# Patient Record
Sex: Female | Born: 1955
Health system: Southern US, Community
[De-identification: ages and names within clinical notes are randomized; demographics above are authoritative.]

## PROBLEM LIST (undated history)

## (undated) DIAGNOSIS — M1990 Unspecified osteoarthritis, unspecified site: Secondary | ICD-10-CM

## (undated) DIAGNOSIS — I10 Essential (primary) hypertension: Secondary | ICD-10-CM

## (undated) DIAGNOSIS — K219 Gastro-esophageal reflux disease without esophagitis: Secondary | ICD-10-CM

## (undated) DIAGNOSIS — D649 Anemia, unspecified: Secondary | ICD-10-CM

## (undated) DIAGNOSIS — K5792 Diverticulitis of intestine, part unspecified, without perforation or abscess without bleeding: Secondary | ICD-10-CM

## (undated) DIAGNOSIS — K259 Gastric ulcer, unspecified as acute or chronic, without hemorrhage or perforation: Secondary | ICD-10-CM

## (undated) DIAGNOSIS — T7840XA Allergy, unspecified, initial encounter: Secondary | ICD-10-CM

## (undated) DIAGNOSIS — K759 Inflammatory liver disease, unspecified: Secondary | ICD-10-CM

## (undated) HISTORY — DX: Allergy, unspecified, initial encounter: T78.40XA

## (undated) HISTORY — PX: FOOT SURGERY: SHX648

## (undated) HISTORY — DX: Gastric ulcer, unspecified as acute or chronic, without hemorrhage or perforation: K25.9

## (undated) HISTORY — DX: Anemia, unspecified: D64.9

## (undated) HISTORY — PX: PARTIAL HYSTERECTOMY: SHX80

## (undated) HISTORY — DX: Gastro-esophageal reflux disease without esophagitis: K21.9

## (undated) HISTORY — PX: EYE SURGERY: SHX253

## (undated) HISTORY — DX: Diverticulitis of intestine, part unspecified, without perforation or abscess without bleeding: K57.92

## (undated) HISTORY — PX: OTHER SURGICAL HISTORY: SHX169

## (undated) HISTORY — PX: ABDOMINAL HYSTERECTOMY: SHX81

## (undated) HISTORY — PX: LAPAROSCOPIC GASTROTOMY W/ REPAIR OF ULCER: SUR772

## (undated) HISTORY — PX: TUBAL LIGATION: SHX77

## (undated) HISTORY — PX: TONSILLECTOMY: SUR1361

---

## 1997-07-31 ENCOUNTER — Other Ambulatory Visit: Admission: RE | Admit: 1997-07-31 | Discharge: 1997-07-31 | Payer: Self-pay | Admitting: Family Medicine

## 1998-02-03 ENCOUNTER — Ambulatory Visit (HOSPITAL_COMMUNITY): Admission: RE | Admit: 1998-02-03 | Discharge: 1998-02-03 | Payer: Self-pay | Admitting: Gastroenterology

## 1998-08-12 ENCOUNTER — Emergency Department (HOSPITAL_COMMUNITY): Admission: EM | Admit: 1998-08-12 | Discharge: 1998-08-12 | Payer: Self-pay | Admitting: Internal Medicine

## 1999-04-12 ENCOUNTER — Encounter: Payer: Self-pay | Admitting: Emergency Medicine

## 1999-04-12 ENCOUNTER — Emergency Department (HOSPITAL_COMMUNITY): Admission: EM | Admit: 1999-04-12 | Discharge: 1999-04-12 | Payer: Self-pay | Admitting: Emergency Medicine

## 2000-11-21 ENCOUNTER — Emergency Department (HOSPITAL_COMMUNITY): Admission: EM | Admit: 2000-11-21 | Discharge: 2000-11-21 | Payer: Self-pay

## 2001-03-01 ENCOUNTER — Emergency Department (HOSPITAL_COMMUNITY): Admission: EM | Admit: 2001-03-01 | Discharge: 2001-03-01 | Payer: Self-pay

## 2001-09-03 ENCOUNTER — Emergency Department (HOSPITAL_COMMUNITY): Admission: EM | Admit: 2001-09-03 | Discharge: 2001-09-03 | Payer: Self-pay | Admitting: *Deleted

## 2001-10-04 ENCOUNTER — Emergency Department (HOSPITAL_COMMUNITY): Admission: EM | Admit: 2001-10-04 | Discharge: 2001-10-04 | Payer: Self-pay

## 2003-04-22 ENCOUNTER — Emergency Department (HOSPITAL_COMMUNITY): Admission: EM | Admit: 2003-04-22 | Discharge: 2003-04-22 | Payer: Self-pay | Admitting: *Deleted

## 2003-07-22 ENCOUNTER — Emergency Department (HOSPITAL_COMMUNITY): Admission: EM | Admit: 2003-07-22 | Discharge: 2003-07-22 | Payer: Self-pay | Admitting: Emergency Medicine

## 2004-07-26 ENCOUNTER — Emergency Department (HOSPITAL_COMMUNITY): Admission: EM | Admit: 2004-07-26 | Discharge: 2004-07-26 | Payer: Self-pay | Admitting: Emergency Medicine

## 2004-07-28 ENCOUNTER — Emergency Department (HOSPITAL_COMMUNITY): Admission: EM | Admit: 2004-07-28 | Discharge: 2004-07-28 | Payer: Self-pay | Admitting: Family Medicine

## 2004-08-02 ENCOUNTER — Emergency Department (HOSPITAL_COMMUNITY): Admission: EM | Admit: 2004-08-02 | Discharge: 2004-08-02 | Payer: Self-pay | Admitting: Family Medicine

## 2005-06-09 ENCOUNTER — Emergency Department (HOSPITAL_COMMUNITY): Admission: EM | Admit: 2005-06-09 | Discharge: 2005-06-10 | Payer: Self-pay | Admitting: Emergency Medicine

## 2006-07-11 ENCOUNTER — Ambulatory Visit: Payer: Self-pay | Admitting: Gastroenterology

## 2006-07-11 LAB — CONVERTED CEMR LAB
ALT: 29 units/L (ref 0–35)
AST: 39 units/L — ABNORMAL HIGH (ref 0–37)
Albumin: 3.2 g/dL — ABNORMAL LOW (ref 3.5–5.2)
Alkaline Phosphatase: 54 units/L (ref 39–117)
Ammonia: 22 umol/L (ref 11–35)
BUN: 6 mg/dL (ref 6–23)
Basophils Absolute: 0.1 10*3/uL (ref 0.0–0.1)
Basophils Relative: 0.7 % (ref 0.0–1.0)
Bilirubin, Direct: 0.1 mg/dL (ref 0.0–0.3)
CO2: 32 meq/L (ref 19–32)
Calcium: 8.9 mg/dL (ref 8.4–10.5)
Chloride: 110 meq/L (ref 96–112)
Creatinine, Ser: 1.1 mg/dL (ref 0.4–1.2)
Eosinophils Absolute: 0.1 10*3/uL (ref 0.0–0.6)
Eosinophils Relative: 1.2 % (ref 0.0–5.0)
GFR calc Af Amer: 68 mL/min
GFR calc non Af Amer: 56 mL/min
Glucose, Bld: 90 mg/dL (ref 70–99)
HCT: 33.4 % — ABNORMAL LOW (ref 36.0–46.0)
Hemoglobin: 11.2 g/dL — ABNORMAL LOW (ref 12.0–15.0)
Lymphocytes Relative: 42.6 % (ref 12.0–46.0)
MCHC: 33.5 g/dL (ref 30.0–36.0)
MCV: 87.5 fL (ref 78.0–100.0)
Monocytes Absolute: 0.8 10*3/uL — ABNORMAL HIGH (ref 0.2–0.7)
Monocytes Relative: 8.8 % (ref 3.0–11.0)
Neutro Abs: 4.5 10*3/uL (ref 1.4–7.7)
Neutrophils Relative %: 46.7 % (ref 43.0–77.0)
Platelets: 264 10*3/uL (ref 150–400)
Potassium: 3.4 meq/L — ABNORMAL LOW (ref 3.5–5.1)
RBC: 3.81 M/uL — ABNORMAL LOW (ref 3.87–5.11)
RDW: 13.9 % (ref 11.5–14.6)
Sodium: 144 meq/L (ref 135–145)
Total Bilirubin: 0.7 mg/dL (ref 0.3–1.2)
Total Protein: 6.2 g/dL (ref 6.0–8.3)
WBC: 9.6 10*3/uL (ref 4.5–10.5)

## 2006-07-13 ENCOUNTER — Ambulatory Visit: Payer: Self-pay | Admitting: Gastroenterology

## 2006-08-16 ENCOUNTER — Ambulatory Visit: Payer: Self-pay | Admitting: Gastroenterology

## 2006-11-22 ENCOUNTER — Ambulatory Visit (HOSPITAL_COMMUNITY): Admission: RE | Admit: 2006-11-22 | Discharge: 2006-11-22 | Payer: Self-pay | Admitting: Obstetrics and Gynecology

## 2007-04-09 DIAGNOSIS — K2980 Duodenitis without bleeding: Secondary | ICD-10-CM | POA: Insufficient documentation

## 2007-04-09 DIAGNOSIS — Z8711 Personal history of peptic ulcer disease: Secondary | ICD-10-CM | POA: Insufficient documentation

## 2007-04-09 DIAGNOSIS — K5900 Constipation, unspecified: Secondary | ICD-10-CM | POA: Insufficient documentation

## 2008-06-10 ENCOUNTER — Encounter (HOSPITAL_COMMUNITY): Payer: Self-pay | Admitting: Obstetrics and Gynecology

## 2008-06-10 ENCOUNTER — Inpatient Hospital Stay (HOSPITAL_COMMUNITY): Admission: RE | Admit: 2008-06-10 | Discharge: 2008-06-12 | Payer: Self-pay | Admitting: Obstetrics and Gynecology

## 2010-01-24 ENCOUNTER — Encounter: Payer: Self-pay | Admitting: Gastroenterology

## 2010-04-12 LAB — COMPREHENSIVE METABOLIC PANEL
AST: 34 U/L (ref 0–37)
Albumin: 3.7 g/dL (ref 3.5–5.2)
Alkaline Phosphatase: 70 U/L (ref 39–117)
BUN: 7 mg/dL (ref 6–23)
CO2: 25 mEq/L (ref 19–32)
Chloride: 108 mEq/L (ref 96–112)
Potassium: 4.4 mEq/L (ref 3.5–5.1)
Total Bilirubin: 0.4 mg/dL (ref 0.3–1.2)

## 2010-04-12 LAB — CBC
HCT: 40.2 % (ref 36.0–46.0)
Hemoglobin: 12.3 g/dL (ref 12.0–15.0)
MCHC: 33.2 g/dL (ref 30.0–36.0)
MCV: 93 fL (ref 78.0–100.0)
Platelets: 199 10*3/uL (ref 150–400)
RBC: 3.98 MIL/uL (ref 3.87–5.11)
RBC: 4.33 MIL/uL (ref 3.87–5.11)
WBC: 8.1 10*3/uL (ref 4.0–10.5)

## 2010-04-12 LAB — TYPE AND SCREEN: ABO/RH(D): O POS

## 2010-05-18 NOTE — Assessment & Plan Note (Signed)
[facility]                         GASTROENTEROLOGY OFFICE NOTE   NAME:Angela Villanueva, Angela Villanueva                  MRN:          [id number]  DATE:07/11/2006                            DOB:          [birth year]    PROBLEMS:  Abdominal pain.   HISTORY OF PRESENT ILLNESS:  [name] is a 55 year old African  American female complaining of severe abdominal gas.  She is unable to  bring up or pass gas.  She has a burning sensation throughout her  abdomen that radiates into her chest.  She has burning with swallowing.  She complains of dysphagia to solids and liquids.  She complains of  chronic constipation and is unable to go without the use of stimulants.  She is taking 25 Dulcolax tablets a day as well as thirty 200 mg  cimetidine tablets daily.  She denies vomiting.  She claims to have been  having black, yellow and green stools.  There is no history of frank  rectal bleeding.  She has lost 15 pounds in the past 6 days.  She has a  bad taste in her mouth.  She has a remote history of ulcers 20 years  ago.   PAST MEDICAL HISTORY:  Pertinent for tubal ligation and ulcerative  surgery.   FAMILY HISTORY:  Positive for sister with ovarian cancer.   MEDICATIONS:  1. Gas-X.  2. Dulcolax.  3. Cimetidine.   ALLERGIES:  PENICILLIN.   SOCIAL HISTORY:  She neither smokes or drinks.  She is single and works  as a Merchandiser, retail at [employer].   REVIEW OF SYSTEMS:  Positive for sleeping problems, dysmenorrhea,  fatigue and headaches.   PHYSICAL EXAMINATION:  VITAL SIGNS:  Pulse 72, blood pressure 94/58,  weight 113.4.  HEENT:  EOMI. PERRLA. Sclerae are anicteric.  Conjunctivae are pink.  NECK:  Supple without thyromegaly, adenopathy or carotid bruits.  CHEST:  Clear to auscultation and percussion without adventitious  sounds.  CARDIAC:  Regular rhythm; normal S1 S2.  There are no murmurs, gallops  or rubs.  ABDOMEN:  She has mild diffuse abdominal tenderness  without guarding or  rebound.  There are no abdominal masses or organomegaly.  RECTAL:  There is no stool in the rectal vaults.  The stool is hemoccult  negative.  EXTREMITIES:  Full range of motion.  No cyanosis, clubbing or edema.   IMPRESSION:  1. Diffuse abdominal pain with what sounds like acid reflux.  2. Constipation.  3. Drug abuse of cimetidine and Dulcolax.   RECOMMENDATION:  1. Discontinue Dulcolax and cimetidine.  2. Begin Zegerid 40 mg b.i.d.  3. Upper endoscopy and colonoscopy.  4. Check CBC, amylase and C-met.     Barbette Hair. Arlyce Dice, MD,FACG  Electronically Signed    RDK/MedQ  DD: 07/11/2006  DT: 07/12/2006  Job #: [id number]   cc:   Lorelle Formosa, M.D.

## 2010-05-18 NOTE — Assessment & Plan Note (Signed)
[facility]                         GASTROENTEROLOGY OFFICE NOTE   NAME:Villanueva, Angela LAVERGNE                  MRN:          [id number]  DATE:08/16/2006                            DOB:          1955-03-19    PROBLEM:  Abdominal pain.   Angela Villanueva has returned for scheduled followup. On a regimen of  Zegerid 40 mg daily, she no longer has GI complaints. Specifically, she  is without pain, nausea, or bloating. She has gained 6 pounds since her  visit one month ago.   PHYSICAL EXAMINATION:  Pulse 72, blood pressure 112/64, weight 119.   IMPRESSION:  Multiple GI complaints very likely related to drug abuse  with cimetidine and Dulcolax.   RECOMMENDATIONS:  Switch to omeprazole 20 mg a day as maintenance. She  will return as needed.     Barbette Hair. Arlyce Dice, MD,FACG  Electronically Signed    RDK/MedQ  DD: 08/16/2006  DT: 08/17/2006  Job #: [id number]   cc:   Lorelle Formosa, M.D.

## 2010-05-21 NOTE — Op Note (Signed)
NAME:  Angela Villanueva, Angela Villanueva NO.:  [id number]   MEDICAL RECORD NO.:  [id number]          PATIENT TYPE:  INP   LOCATION:  9316                          FACILITY:  [facility]   PHYSICIAN:  Zelphia Cairo, MD    DATE OF BIRTH:  Jan 20, 1955   DATE OF PROCEDURE:  06/13/2008  DATE OF DISCHARGE:  06/12/2008                               OPERATIVE REPORT   PREOPERATIVE DIAGNOSES:  1. Fibroid uterus.  2. Pelvic pain.   POSTOPERATIVE DIAGNOSES:  1. Fibroid uterus.  2. Pelvic pain.   PROCEDURE:  Total abdominal hysterectomy.   SURGEON:  Zelphia Cairo, MD   ASSISTANT:  Dineen Kid. Rana Snare, MD   ANESTHESIA:  General.   COMPLICATIONS:  None.   BLOOD LOSS:  Minimal.   FINDINGS:  Irregularly shaped enlarged fibroid uterus.  Bilateral  fallopian tubes and ovaries appeared normal.   PROCEDURE:  The risks, benefits, indications, and alternatives of  hysterectomy were reviewed with [name] and informed consent was  obtained.  She was taken to the operating room, where general anesthesia  was obtained.  She was in the supine position.  She was prepped and  draped in sterile fashion and a Foley catheter was inserted sterilely.  Pfannenstiel skin incision was made with a scalpel approximately 2 cm  above the symphysis pubis and extended sharply to the level of the  fascia.  The fascia was incised bilaterally with curved Mayo scissors  and the muscles of the anterior abdominal wall were separated in the  midline by sharp and blunt dissection.  Peritoneum was grasped between 2  pickups, elevated, and sharply entered with Metzenbaum scissors.  This  was extended superiorly and inferiorly with good visualization of the  bladder.  The uterus was then palpated and brought through our incision.  Moist lap sponges were placed into the abdomen to pack the bowel away  from our incision.  Two Kelly clamps were placed on the cornua and used  for retraction.  Bilateral round ligaments were clamped,  transected, and  cauterized using the Bovie.  The anterior leaf of the broad ligament was  incised along the bladder reflection to the midline from both sides.  The bladder was then gently dissected off the lower uterine segment and  the cervix using a 4 x 4 sponge.  The utero-ovarian ligament and  fallopian tube were then clamped, transected, and suture ligated using 0  Vicryl.  Hemostasis was achieved.  Uterine arteries were skeletonized  bilaterally, clamped with Heaney clamps, transected, and suture ligated.  Once hemostasis was assured bilaterally, uterosacral ligaments were  clamped, transected, and suture ligated.  The cervix and uterus were  then amputated.  The vaginal cuff angles were closed using figure-of-  eight stitches and the vaginal cuff was reapproximated using figure-of-  eight with 0-0 Vicryl.  Uterosacral ligaments were reapproximated using  a single interrupted suture.  The pelvis was then irrigated with warm  normal saline.  All  pedicles were inspected and found to be hemostatic.  Lap and sponges  were removed from the abdomen.  The fascia was closed with a running  locked stitch.  The skin was closed with a 3-0 Vicryl.  Sponge, lap,  needle, and instrument counts were correct x2.  She was taken to the  PACU in stable condition.      Zelphia Cairo, MD  Electronically Signed     GA/MEDQ  D:  06/13/2008  T:  06/14/2008  Job:  [id number]

## 2010-10-12 ENCOUNTER — Ambulatory Visit: Payer: Self-pay

## 2010-10-27 ENCOUNTER — Other Ambulatory Visit: Payer: Self-pay | Admitting: Gastroenterology

## 2011-02-14 ENCOUNTER — Emergency Department (HOSPITAL_COMMUNITY)
Admission: EM | Admit: 2011-02-14 | Discharge: 2011-02-14 | Disposition: A | Discharge status: Home / Self Care | Payer: Self-pay | Attending: Emergency Medicine | Admitting: Emergency Medicine

## 2011-02-14 ENCOUNTER — Encounter (HOSPITAL_COMMUNITY): Payer: Self-pay | Admitting: *Deleted

## 2011-02-14 DIAGNOSIS — H5710 Ocular pain, unspecified eye: Secondary | ICD-10-CM | POA: Insufficient documentation

## 2011-02-14 DIAGNOSIS — H9319 Tinnitus, unspecified ear: Secondary | ICD-10-CM | POA: Insufficient documentation

## 2011-02-14 DIAGNOSIS — H1130 Conjunctival hemorrhage, unspecified eye: Secondary | ICD-10-CM | POA: Insufficient documentation

## 2011-02-14 DIAGNOSIS — H1131 Conjunctival hemorrhage, right eye: Secondary | ICD-10-CM

## 2011-02-14 DIAGNOSIS — Z79899 Other long term (current) drug therapy: Secondary | ICD-10-CM | POA: Insufficient documentation

## 2011-02-14 MED ORDER — FLUORESCEIN SODIUM 1 MG OP STRP
1.0000 | ORAL_STRIP | Freq: Once | OPHTHALMIC | Status: AC
  Administered 2011-02-14: 1 via OPHTHALMIC
  Filled 2011-02-14: qty 1

## 2011-02-14 MED ORDER — TETRACAINE HCL 0.5 % OP SOLN
1.0000 [drp] | Freq: Once | OPHTHALMIC | Status: AC
  Administered 2011-02-14: 1 [drp] via OPHTHALMIC
  Filled 2011-02-14: qty 2

## 2011-02-14 MED ORDER — MECLIZINE HCL 25 MG PO TABS: 25.0000 mg | ORAL_TABLET | Freq: Once | ORAL | Status: DC

## 2011-02-14 NOTE — ED Provider Notes (Signed)
History     CSN: [id number]  Arrival date & time 02/14/11  1701   First MD Initiated Contact with Patient 02/14/11 2103      Chief Complaint  Patient presents with  . Tinnitus  . Eye Pain    denies pain; only c/o redness    (Consider location/radiation/quality/duration/timing/severity/associated sxs/prior treatment) HPI Comments: Patient presents emergency department with a chief complaint of bilateral tinnitus x2 days.  Patient denies any ear pain or change in hearing.  Patient has had no difficulty with ambulation and denies disequilibrium, headaches, ataxia, vertigo, nausea, vomiting, abdominal pain.  In addition patient states that she woke up this morning with a right red eye.  Patient states that she was constipated last night and had a very she should drained bowel movement which she thinks may have caused the redness of her eye.  Patient denies any pain, change in vision, photophobia, tearing or discharge from the eye, headache.  Associated symptoms include nasal congestion that started on Friday.  The history is provided by the patient.    History reviewed. No pertinent past medical history.  Past Surgical History  Procedure Date  . Fibroid turmor removal   . Abdominal hysterectomy     No family history on file.  History  Substance Use Topics  . Smoking status: Former Games developer  . Smokeless tobacco: Not on file  . Alcohol Use: No    OB History    Grav Para Term Preterm Abortions TAB SAB Ect Mult Living                  Review of Systems  All other systems reviewed and are negative.  Ten systems reviewed and are negative for acute change, except as noted in the HPI.    Allergies  Penicillins  Home Medications   Current Outpatient Rx  Name Route Sig Dispense Refill  . LANSOPRAZOLE 15 MG PO TBDP Oral Take 15 mg by mouth daily.    Marland Kitchen NAPROXEN SODIUM 220 MG PO TABS Oral Take 220 mg by mouth 2 (two) times daily with a meal.      BP 128/83  Pulse 61   Temp(Src) 98.1 F (36.7 C) (Oral)  Resp 16  Ht 5\' 4"  (1.626 m)  Wt 100 lb (45.36 kg)  BMI 17.17 kg/m2  SpO2 100%  Physical Exam  Nursing note and vitals reviewed. Constitutional: She is oriented to person, place, and time. She appears well-developed and well-nourished. No distress.  HENT:  Head: Normocephalic and atraumatic.       Pt can hear finger rub, no cerumen impaction, external ear canals normal bilaterally, normal TM   Eyes: Conjunctivae and EOM are normal. Pupils are equal, round, and reactive to light. Right eye exhibits no discharge. Left eye exhibits no discharge. No foreign body present in the left eye.       No tenderness to palpation over temporal arteries or orbital region. Pain free EOMs, visual acuity equal bilaterally, no increase in IOPs, no proptosis, lid swelling, hyphema, purulent discharge from eyes, or consensual photophobia. Right eye positive for subconjunctival hemorrhage.   Fluorescein study: no corneal uptake, dendritic pattern, or evidence of corneal abrasions.  Neck: Normal range of motion. Neck supple.  Pulmonary/Chest: Effort normal.  Neurological: She is alert and oriented to person, place, and time.  Skin: Skin is warm and dry. No rash noted. She is not diaphoretic.  Psychiatric: Her behavior is normal.    ED Course  Procedures (including critical care time)  Labs Reviewed - No data to display No results found.   No diagnosis found.    MDM  Tinnitus, subconjunctival hemorrhage           Jaci Carrel, PA-C 02/14/11 2202

## 2011-02-14 NOTE — ED Notes (Signed)
Pt states she will return in AM to pick up discharge papers. Reports she has to be at work at 0300.

## 2011-02-14 NOTE — ED Notes (Signed)
Pt states "I've been having ringing in my ear has been going on for 2 days, woke up this morning with my eye red"; pt denies ROD itching or having drainage

## 2011-02-14 NOTE — Discharge Instructions (Signed)
Subconjunctival Hemorrhage Your exam shows you have a subconjunctival hemorrhage. This is a harmless collection of blood covering a portion of the white of the eye. This condition may be due to injury or to straining (lifting, sneezing, or coughing). Often, there is no known cause. Subconjunctival blood does not cause pain or vision problems. This condition needs no treatment. It will take 1 to 2 weeks for the blood to dissolve. If you take aspirin or Coumadin on a daily basis or if you have high blood pressure, you should check with your doctor about the need for further treatment. Please call your doctor if you have problems with your vision, pain around the eye, or any other concerns about your condition. Document Released: 01/28/2004 Document Revised: 09/01/2010 Document Reviewed: 11/17/2008 [facility] Patient Information 2012 [place], [place].  Tinnitus Sounds you hear in your ears and coming from within the ear is called tinnitus. This can be a symptom of many ear disorders. It is often associated with hearing loss.  Tinnitus can be seen with:  Infections.   Ear blockages such as wax buildup.   Meniere's disease.   Ear damage.   Inherited.   Occupational causes.  While irritating, it is not usually a threat to health. When the cause of the tinnitus is wax, infection in the middle ear, or foreign body it is easily treated. Hearing loss will usually be reversible.  TREATMENT  When treating the underlying cause does not get rid of tinnitus, it may be necessary to get rid of the unwanted sound by covering it up with more pleasant background noises. This may include music, the radio etc. There are tinnitus maskers which can be worn which produce background noise to cover up the tinnitus. Avoid all medications which tend to make tinnitus worse such as alcohol, caffeine, aspirin, and nicotine. There are many soothing background tapes such as rain, ocean, thunderstorms, etc. These soothing sounds  help with sleeping or resting. Keep all follow-up appointments and referrals. This is important to identify the cause of the problem. It also helps avoid complications, impaired hearing, disability, or chronic pain. Document Released: 12/20/2004 Document Revised: 09/01/2010 Document Reviewed: 08/08/2007 [facility] Patient Information 2012 [place], [place].

## 2011-02-15 NOTE — ED Provider Notes (Signed)
Medical screening examination/treatment/procedure(s) were performed by non-physician practitioner and as supervising physician I was immediately available for consultation/collaboration.  Raeford Razor, MD 02/15/11 [phone]

## 2011-08-08 ENCOUNTER — Telehealth: Payer: Self-pay | Admitting: Gastroenterology

## 2011-08-08 NOTE — Telephone Encounter (Signed)
Left message for pt to call back  °

## 2011-08-09 NOTE — Telephone Encounter (Signed)
Pt states she is having problems with reflux and keeping her food down. Also c/o "knots in her stomach." Requesting sooner appt. Pt scheduled 08/19/11@3pm  with Mike Gip PA. Pt aware of appt date and time.

## 2011-08-19 ENCOUNTER — Ambulatory Visit: Payer: Self-pay | Admitting: Physician Assistant

## 2012-03-27 ENCOUNTER — Encounter: Payer: Self-pay | Admitting: Gastroenterology

## 2012-05-02 ENCOUNTER — Ambulatory Visit (INDEPENDENT_AMBULATORY_CARE_PROVIDER_SITE_OTHER): Payer: Self-pay | Admitting: Gastroenterology

## 2012-05-02 ENCOUNTER — Encounter: Payer: Self-pay | Admitting: Gastroenterology

## 2012-05-02 VITALS — BP 120/70 | HR 72 | Ht 64.0 in | Wt 110.2 lb

## 2012-05-02 DIAGNOSIS — K5900 Constipation, unspecified: Secondary | ICD-10-CM

## 2012-05-02 DIAGNOSIS — K219 Gastro-esophageal reflux disease without esophagitis: Secondary | ICD-10-CM

## 2012-05-02 DIAGNOSIS — R1310 Dysphagia, unspecified: Secondary | ICD-10-CM

## 2012-05-02 MED ORDER — NA SULFATE-K SULFATE-MG SULF 17.5-3.13-1.6 GM/177ML PO SOLN: 1.0000 | Freq: Once | ORAL | Status: DC

## 2012-05-02 NOTE — Assessment & Plan Note (Signed)
The patient is symptomatic despite therapy with Zegerid.  Recommendations #1 trial of dexilant 60 mg before breakfast and dinner

## 2012-05-02 NOTE — Progress Notes (Signed)
History of Present Illness: 57 year old Afro-American female self-referred for evaluation of pyrosis and constipation. Both have been chronic problems. She takes Zegerid without much relief. She complains of odynophagia and dysphagia to solids. She has postprandial upper abdominal discomfort as well. Although Naprosyn is listed amongst her medications she is not taking it   Constipation is an active problem. Without the use of a cathartic she feels that she cannot move her bowels. With constipation she has abdominal bloating and fullness. Esophageal manometry in 2000 was normal. There was very slight incomplete relaxation of the LES. Endoscopy in 2008 demonstrated duodenitis.     History reviewed. No pertinent past medical history. Past Surgical History  Procedure Laterality Date  . Fibroid turmor removal    . Abdominal hysterectomy    . Laparoscopic gastrotomy w/ repair of ulcer     family history includes COPD in her mother. Current Outpatient Prescriptions  Medication Sig Dispense Refill  . magnesium hydroxide (MILK OF MAGNESIA) 400 MG/5ML suspension Take 5 mLs by mouth daily as needed for constipation.      . naproxen sodium (ANAPROX) 220 MG tablet Take 220 mg by mouth 2 (two) times daily with a meal.      . omeprazole-sodium bicarbonate (ZEGERID) 40-1100 MG per capsule Take 1 capsule by mouth daily before breakfast.      . simethicone (MYLICON) 80 MG chewable tablet Chew 80 mg by mouth every 6 (six) hours as needed for flatulence.       No current facility-administered medications for this visit.   Allergies as of 05/02/2012 - Review Complete 05/02/2012  Allergen Reaction Noted  . Penicillins Hives and Rash 02/14/2011    reports that she has quit smoking. She has never used smokeless tobacco. She reports that she does not drink alcohol or use illicit drugs.     Review of Systems: She complains of hot flashes and cold flashes Pertinent positive and negative review of systems were  noted in the above HPI section. All other review of systems were otherwise negative.  Vital signs were reviewed in today's medical record Physical Exam: General: Well developed , well nourished, no acute distress Skin: anicteric Head: Normocephalic and atraumatic Eyes:  sclerae anicteric, EOMI Ears: Normal auditory acuity Mouth: No deformity or lesions Neck: Supple, no masses or thyromegaly Lungs: Clear throughout to auscultation Heart: Regular rate and rhythm; no murmurs, rubs or bruits Abdomen: Soft,  and non distended. No masses, hepatosplenomegaly or hernias noted. Normal Bowel sounds. Slight mid epigastric tenderness Rectal:deferred Musculoskeletal: Symmetrical with no gross deformities  Skin: No lesions on visible extremities Pulses:  Normal pulses noted Extremities: No clubbing, cyanosis, edema or deformities noted Neurological: Alert oriented x 4, grossly nonfocal Cervical Nodes:  No significant cervical adenopathy Inguinal Nodes: No significant inguinal adenopathy Psychological:  Alert and cooperative. Normal mood and affect

## 2012-05-02 NOTE — Patient Instructions (Addendum)
Gastroesophageal Reflux Disease, Adult Gastroesophageal reflux disease (GERD) happens when acid from your stomach flows up into the esophagus. When acid comes in contact with the esophagus, the acid causes soreness (inflammation) in the esophagus. Over time, GERD may create small holes (ulcers) in the lining of the esophagus. CAUSES   Increased body weight. This puts pressure on the stomach, making acid rise from the stomach into the esophagus.  Smoking. This increases acid production in the stomach.  Drinking alcohol. This causes decreased pressure in the lower esophageal sphincter (valve or ring of muscle between the esophagus and stomach), allowing acid from the stomach into the esophagus.  Late evening meals and a full stomach. This increases pressure and acid production in the stomach.  A malformed lower esophageal sphincter. Sometimes, no cause is found. SYMPTOMS   Burning pain in the lower part of the mid-chest behind the breastbone and in the mid-stomach area. This may occur twice a week or more often.  Trouble swallowing.  Sore throat.  Dry cough.  Asthma-like symptoms including chest tightness, shortness of breath, or wheezing. DIAGNOSIS  Your caregiver may be able to diagnose GERD based on your symptoms. In some cases, X-rays and other tests may be done to check for complications or to check the condition of your stomach and esophagus. TREATMENT  Your caregiver may recommend over-the-counter or prescription medicines to help decrease acid production. Ask your caregiver before starting or adding any new medicines.  HOME CARE INSTRUCTIONS   Change the factors that you can control. Ask your caregiver for guidance concerning weight loss, quitting smoking, and alcohol consumption.  Avoid foods and drinks that make your symptoms worse, such as:  Caffeine or alcoholic drinks.  Chocolate.  Peppermint or mint flavorings.  Garlic and onions.  Spicy foods.  Citrus fruits,  such as oranges, lemons, or limes.  Tomato-based foods such as sauce, chili, salsa, and pizza.  Fried and fatty foods.  Avoid lying down for the 3 hours prior to your bedtime or prior to taking a nap.  Eat small, frequent meals instead of large meals.  Wear loose-fitting clothing. Do not wear anything tight around your waist that causes pressure on your stomach.  Raise the head of your bed 6 to 8 inches with wood blocks to help you sleep. Extra pillows will not help.  Only take over-the-counter or prescription medicines for pain, discomfort, or fever as directed by your caregiver.  Do not take aspirin, ibuprofen, or other nonsteroidal anti-inflammatory drugs (NSAIDs). SEEK IMMEDIATE MEDICAL CARE IF:   You have pain in your arms, neck, jaw, teeth, or back.  Your pain increases or changes in intensity or duration.  You develop nausea, vomiting, or sweating (diaphoresis).  You develop shortness of breath, or you faint.  Your vomit is green, yellow, black, or looks like coffee grounds or blood.  Your stool is red, bloody, or black. These symptoms could be signs of other problems, such as heart disease, gastric bleeding, or esophageal bleeding. MAKE SURE YOU:   Understand these instructions.  Will watch your condition.  Will get help right away if you are not doing well or get worse. Document Released: 09/29/2004 Document Revised: 03/14/2011 Document Reviewed: 07/09/2010 [facility] Patient Information 2013 [place], [place].   You have been scheduled for an endoscopy with propofol. Please follow written instructions given to you at your visit today. If you use inhalers (even only as needed), please bring them with you on the day of your procedure. Your physician has requested  that you go to www.startemmi.com and enter the access code given to you at your visit today. This web site gives a general overview about your procedure. However, you should still follow specific instructions  given to you by our office regarding your preparation for the procedure.   You have been scheduled for a colonoscopy with propofol. Please follow written instructions given to you at your visit today.  Please pick up your prep kit at the pharmacy within the next 1-3 days. If you use inhalers (even only as needed), please bring them with you on the day of your procedure. Your physician has requested that you go to www.startemmi.com and enter the access code given to you at your visit today. This web site gives a general overview about your procedure. However, you should still follow specific instructions given to you by our office regarding your preparation for the procedure.

## 2012-05-02 NOTE — Assessment & Plan Note (Signed)
Patient probably has idiopathic constipation. A structural abnormality of the colon should be ruled out.  Recommendations #1 colonoscopy #2 if no abnormalities are seen that may be responsible for constipation she will consider enrollment in a constipation trial

## 2012-05-02 NOTE — Assessment & Plan Note (Signed)
The patient has both odynophagia and dysphagia suggesting both esophageal inflammation and possible stricture  Recommendations #1 upper endoscopy with dilatation as indicated  Risks, alternatives, and complications of the procedure, including bleeding, perforation, and possible need for surgery, were explained to the patient.  Patient's questions were answered.

## 2012-05-10 ENCOUNTER — Ambulatory Visit (AMBULATORY_SURGERY_CENTER): Payer: Self-pay | Admitting: Gastroenterology

## 2012-05-10 ENCOUNTER — Encounter: Payer: Self-pay | Admitting: Gastroenterology

## 2012-05-10 VITALS — BP 135/94 | HR 63 | Temp 97.1°F | Resp 14 | Ht 64.0 in | Wt 110.0 lb

## 2012-05-10 DIAGNOSIS — K2970 Gastritis, unspecified, without bleeding: Secondary | ICD-10-CM

## 2012-05-10 DIAGNOSIS — R1310 Dysphagia, unspecified: Secondary | ICD-10-CM

## 2012-05-10 DIAGNOSIS — K219 Gastro-esophageal reflux disease without esophagitis: Secondary | ICD-10-CM

## 2012-05-10 DIAGNOSIS — K2990 Gastroduodenitis, unspecified, without bleeding: Secondary | ICD-10-CM

## 2012-05-10 MED ORDER — SODIUM CHLORIDE 0.9 % IV SOLN: 500.0000 mL | INTRAVENOUS | Status: DC

## 2012-05-10 NOTE — Patient Instructions (Addendum)
YOU HAD AN ENDOSCOPIC PROCEDURE TODAY AT THE [facility]: Refer to the procedure report that was given to you for any specific questions about what was found during the examination.  If the procedure report does not answer your questions, please call your gastroenterologist to clarify.  If you requested that your care partner not be given the details of your procedure findings, then the procedure report has been included in a sealed envelope for you to review at your convenience later.  YOU SHOULD EXPECT: Some feelings of bloating in the abdomen. Passage of more gas than usual.  Walking can help get rid of the air that was put into your GI tract during the procedure and reduce the bloating. If you had a lower endoscopy (such as a colonoscopy or flexible sigmoidoscopy) you may notice spotting of blood in your stool or on the toilet paper. If you underwent a bowel prep for your procedure, then you may not have a normal bowel movement for a few days.  DIET:YOU MAY HAVE CLEAR LIQUIDS AT 4:45PM, THEN YOU MAY PROCEED TO A SOFT DIET ALL NIGHT TONIGHT.REDUCE DEXILANT TO1 TIME PER DAY.  DR Arlyce Dice WANTS TO SEE YOU IN THE OFFICE IN ONE MONTH. Drink plenty of fluids but you should avoid alcoholic beverages for 24 hours.  ACTIVITY: Your care partner should take you home directly after the procedure.  You should plan to take it easy, moving slowly for the rest of the day.  You can resume normal activity the day after the procedure however you should NOT DRIVE or use heavy machinery for 24 hours (because of the sedation medicines used during the test).    SYMPTOMS TO REPORT IMMEDIATELY: A gastroenterologist can be reached at any hour.  During normal business hours, 8:30 AM to 5:00 PM Monday through Friday, call [phone].  After hours and on weekends, please call the GI answering service at [phone] who will take a message and have the physician on call contact you.   Following upper endoscopy  (EGD)  Vomiting of blood or coffee ground material  New chest pain or pain under the shoulder blades  Painful or persistently difficult swallowing  New shortness of breath  Fever of 100F or higher  Black, tarry-looking stools  FOLLOW UP: If any biopsies were taken you will be contacted by phone or by letter within the next 1-3 weeks.  Call your gastroenterologist if you have not heard about the biopsies in 3 weeks.  Our staff will call the home number listed on your records the next business day following your procedure to check on you and address any questions or concerns that you may have at that time regarding the information given to you following your procedure. This is a courtesy call and so if there is no answer at the home number and we have not heard from you through the emergency physician on call, we will assume that you have returned to your regular daily activities without incident.  SIGNATURES/CONFIDENTIALITY: You and/or your care partner have signed paperwork which will be entered into your electronic medical record.  These signatures attest to the fact that that the information above on your After Visit Summary has been reviewed and is understood.  Full responsibility of the confidentiality of this discharge information lies with you and/or your care-partner.

## 2012-05-10 NOTE — Progress Notes (Signed)
Patient did not have preoperative order for IV antibiotic SSI prophylaxis. (G8918)  Patient did not experience any of the following events: a burn prior to discharge; a fall within the facility; wrong site/side/patient/procedure/implant event; or a hospital transfer or hospital admission upon discharge from the facility. (G8907)  

## 2012-05-10 NOTE — Op Note (Signed)
[facility] [address] [place], [address]   ENDOSCOPY PROCEDURE REPORT  PATIENT: Angela, Villanueva  MR#: [id number] BIRTHDATE: 1955/12/19 , 57  yrs. old GENDER: Female ENDOSCOPIST: Louis Meckel, MD REFERRED BY: PROCEDURE DATE:  05/10/2012 PROCEDURE:  EGD w/ biopsy and Maloney dilation of esophagus ASA CLASS:     Class II INDICATIONS:  Dysphagia. MEDICATIONS: MAC sedation, administered by CRNA, propofol (Diprivan) 150mg  IV, and Simethicone 0.6cc PO TOPICAL ANESTHETIC: Cetacaine Spray  DESCRIPTION OF PROCEDURE: After the risks benefits and alternatives of the procedure were thoroughly explained, informed consent was obtained.  The LB [id number] endoscope was introduced through the mouth and advanced to the third portion of the duodenum. Without limitations.  The instrument was slowly withdrawn as the mucosa was fully examined.      In the gastric fundus and body there were patches of atrophic appearing mucosa along the greater curvature.  Biopsies were taken.  There is mild erythema at the GE junction.  No frank stricture was seen.   The remainder of the upper endoscopy exam was otherwise normal.  Retroflexed views revealed no abnormalities.     The scope was then withdrawn from the patient .  Because of complaints of dysphagia a #52 [place] Maloney dilator was passed. There was minimal resistance and no heme.  COMPLICATIONS: There were no complications.  ENDOSCOPIC IMPRESSION: esophagitis Gastritis Dysphagia without stricture-status post Elease Hashimoto dilator  RECOMMENDATIONS: Reduce dexilant to once daily Await biopsy results Office visit one month REPEAT EXAM:  eSigned:  Louis Meckel, MD 05/10/2012 3:17 PM   CC:

## 2012-05-11 ENCOUNTER — Telehealth: Payer: Self-pay | Admitting: *Deleted

## 2012-05-11 NOTE — Telephone Encounter (Signed)
  Follow up Call-  Call back number 05/10/2012  Post procedure Call Back phone  # [phone], [phone]  Permission to leave phone message Yes     Patient questions:  Do you have a fever, pain , or abdominal swelling? no Pain Score  0 *  Have you tolerated food without any problems? yes  Have you been able to return to your normal activities? yes  Do you have any questions about your discharge instructions: Diet   no Medications  no Follow up visit  no  Do you have questions or concerns about your Care? no  Actions: * If pain score is 4 or above: No action needed, pain <4.

## 2012-05-16 ENCOUNTER — Telehealth: Payer: Self-pay | Admitting: Gastroenterology

## 2012-05-16 ENCOUNTER — Encounter: Payer: Self-pay | Admitting: Gastroenterology

## 2012-05-16 MED ORDER — NA SULFATE-K SULFATE-MG SULF 17.5-3.13-1.6 GM/177ML PO SOLN: 1.0000 | Freq: Once | ORAL | Status: DC

## 2012-05-16 NOTE — Telephone Encounter (Signed)
Just sent in prep pt informed

## 2012-05-17 ENCOUNTER — Telehealth: Payer: Self-pay | Admitting: Gastroenterology

## 2012-05-17 NOTE — Telephone Encounter (Signed)
Called patient spoke with husband. No SUPREP samples available. Explained that the pt would have to purchase the Suprep from the pharmacy

## 2012-05-17 NOTE — Telephone Encounter (Signed)
ok 

## 2012-05-22 ENCOUNTER — Telehealth: Payer: Self-pay | Admitting: Gastroenterology

## 2012-05-23 NOTE — Telephone Encounter (Signed)
L/m for pt to pick up samples

## 2012-06-11 ENCOUNTER — Encounter: Payer: Self-pay | Admitting: Gastroenterology

## 2012-06-11 ENCOUNTER — Ambulatory Visit (INDEPENDENT_AMBULATORY_CARE_PROVIDER_SITE_OTHER): Payer: Managed Care, Other (non HMO) | Admitting: Gastroenterology

## 2012-06-11 VITALS — BP 96/64 | HR 80 | Ht 64.25 in | Wt 110.2 lb

## 2012-06-11 DIAGNOSIS — K5900 Constipation, unspecified: Secondary | ICD-10-CM

## 2012-06-11 DIAGNOSIS — K219 Gastro-esophageal reflux disease without esophagitis: Secondary | ICD-10-CM

## 2012-06-11 DIAGNOSIS — R1310 Dysphagia, unspecified: Secondary | ICD-10-CM

## 2012-06-11 NOTE — Patient Instructions (Addendum)
Dexilant samples You have been scheduled for a gastric emptying scan at [facility] on 06/19/2012 at 9:00. Please arrive at least 15 minutes prior to your appointment for registration. Please make certain not to have anything to eat or drink after midnight the night before your test. Hold all stomach medications (ex: Zofran, phenergan, Reglan) 48 hours prior to your test. If you need to reschedule your appointment, please contact radiology scheduling at [phone]. _____________________________________________________________________ A gastric-emptying study measures how long it takes for food to move through your stomach. There are several ways to measure stomach emptying. In the most common test, you eat food that contains a small amount of radioactive material. A scanner that detects the movement of the radioactive material is placed over your abdomen to monitor the rate at which food leaves your stomach. This test normally takes about 2 hours to complete. _____________________________________________________________________

## 2012-06-11 NOTE — Assessment & Plan Note (Signed)
Plan colonoscopy. If negative she will consider enrollment in a constipation trial.

## 2012-06-11 NOTE — Assessment & Plan Note (Signed)
Continues to complain of severe burning discomfort despite very high dose dexilant.  Recommendations #1 patient was instructed to reduce dexilant to 60 mg before breakfast and dinner  #2 gastric emptying scan

## 2012-06-11 NOTE — Assessment & Plan Note (Signed)
No complaints following esophageal dilatation of an early stricture

## 2012-06-11 NOTE — Progress Notes (Signed)
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 Nonspecific, mild gastritis was seen. She continues to complain of severe burning chest discomfort despite taking 120 mg of dexilant twice a day. She has mild postprandial nausea. Constipation continues. She complains of excess gas.    Review of Systems: Pertinent positive and negative review of systems were noted in the above HPI section. All other review of systems were otherwise negative.    Current Medications, Allergies, Past Medical History, Past Surgical History, Family History and Social History were reviewed in [employer] electronic medical record  Vital signs were reviewed in today's medical record. Physical Exam: General: Well developed , well nourished, no acute distress

## 2012-06-12 ENCOUNTER — Encounter: Payer: Self-pay | Admitting: Gastroenterology

## 2012-06-12 ENCOUNTER — Ambulatory Visit (AMBULATORY_SURGERY_CENTER): Payer: Managed Care, Other (non HMO) | Admitting: Gastroenterology

## 2012-06-12 VITALS — BP 121/77 | HR 61 | Temp 97.9°F | Resp 20 | Ht 64.0 in | Wt 110.0 lb

## 2012-06-12 DIAGNOSIS — D126 Benign neoplasm of colon, unspecified: Secondary | ICD-10-CM

## 2012-06-12 DIAGNOSIS — K5900 Constipation, unspecified: Secondary | ICD-10-CM

## 2012-06-12 MED ORDER — SODIUM CHLORIDE 0.9 % IV SOLN: 500.0000 mL | INTRAVENOUS | Status: DC

## 2012-06-12 NOTE — Op Note (Signed)
[facility] [address] [place], [address]   COLONOSCOPY PROCEDURE REPORT  PATIENT: Angela, Villanueva  MR#: [id number] BIRTHDATE: 1955-05-07 , 57  yrs. old GENDER: Female ENDOSCOPIST: Louis Meckel, MD REFERRED XB:[id number] McKenzie, M.D. PROCEDURE DATE:  06/12/2012 PROCEDURE:   Colonoscopy with snare polypectomy and Colonoscopy with cold biopsy polypectomy ASA CLASS:   Class II INDICATIONS:average risk screening and Constipation. MEDICATIONS: MAC sedation, administered by CRNA and propofol (Diprivan) 200mg  IV  DESCRIPTION OF PROCEDURE:   After the risks benefits and alternatives of the procedure were thoroughly explained, informed consent was obtained.  A digital rectal exam revealed no abnormalities of the rectum.   The LB [id number]  endoscope was introduced through the anus and advanced to the cecum, which was identified by both the appendix and ileocecal valve. No adverse events experienced.   The quality of the prep was excellent using Suprep  The instrument was then slowly withdrawn as the colon was fully examined.      COLON FINDINGS: Two sessile polyps ranging between 5-35mm in size were found at the cecum.  A saline Injection was given to lift the mucosal wall.  A polypectomy was performed using snare cautery. The resection was complete and the polyp tissue was completely retrieved.   A flat polyp was found in the sigmoid colon.  A polypectomy was performed with cold forceps.  The resection was complete and the polyp tissue was completely retrieved.   Mild diverticulosis was noted in the proximal transverse colon.   The colon mucosa was otherwise normal.  Retroflexed views revealed no abnormalities. The time to cecum=5 minutes 52 seconds.  Withdrawal time=13 minutes 15 seconds.  The scope was withdrawn and the procedure completed. COMPLICATIONS: There were no complications.  ENDOSCOPIC IMPRESSION: 1.   Two sessile polyps ranging  between 5-17mm in size were found at the cecum; saline was given to lift the mucosal wall.; polypectomy was performed using snare cautery 2.   Flat polyp was found in the sigmoid colon; polypectomy was performed with cold forceps 3.   Mild diverticulosis was noted in the proximal transverse colon 4.   The colon mucosa was otherwise normal  RECOMMENDATIONS: 1.  If the polyp(s) removed today are proven to be adenomatous (pre-cancerous) polyps, you will need a colonoscopy in 3 years. Otherwise you should continue to follow colorectal cancer screening guidelines for "routine risk" patients with a colonoscopy in 10 years.  You will receive a letter within 1-2 weeks with the results of your biopsy as well as final recommendations.  Please call my office if you have not received a letter after 3 weeks. 2.   Patient will consider enrollment in a constipation trial   eSigned:  Louis Meckel, MD 06/12/2012 2:58 PM   cc:   PATIENT NAME:  Angela, Villanueva MR#: [id number]

## 2012-06-12 NOTE — Patient Instructions (Addendum)
YOU HAD AN ENDOSCOPIC PROCEDURE TODAY AT THE [facility]: Refer to the procedure report that was given to you for any specific questions about what was found during the examination.  If the procedure report does not answer your questions, please call your gastroenterologist to clarify.  If you requested that your care partner not be given the details of your procedure findings, then the procedure report has been included in a sealed envelope for you to review at your convenience later.  YOU SHOULD EXPECT: Some feelings of bloating in the abdomen. Passage of more gas than usual.  Walking can help get rid of the air that was put into your GI tract during the procedure and reduce the bloating. If you had a lower endoscopy (such as a colonoscopy or flexible sigmoidoscopy) you may notice spotting of blood in your stool or on the toilet paper. If you underwent a bowel prep for your procedure, then you may not have a normal bowel movement for a few days.  DIET: Your first meal following the procedure should be a light meal and then it is ok to progress to your normal diet.  A half-sandwich or bowl of soup is an example of a good first meal.  Heavy or fried foods are harder to digest and may make you feel nauseous or bloated.  Likewise meals heavy in dairy and vegetables can cause extra gas to form and this can also increase the bloating.  Drink plenty of fluids but you should avoid alcoholic beverages for 24 hours.  ACTIVITY: Your care partner should take you home directly after the procedure.  You should plan to take it easy, moving slowly for the rest of the day.  You can resume normal activity the day after the procedure however you should NOT DRIVE or use heavy machinery for 24 hours (because of the sedation medicines used during the test).    SYMPTOMS TO REPORT IMMEDIATELY: A gastroenterologist can be reached at any hour.  During normal business hours, 8:30 AM to 5:00 PM Monday through Friday,  call [phone].  After hours and on weekends, please call the GI answering service at [phone] who will take a message and have the physician on call contact you.   Following lower endoscopy (colonoscopy or flexible sigmoidoscopy):  Excessive amounts of blood in the stool  Significant tenderness or worsening of abdominal pains  Swelling of the abdomen that is new, acute  Fever of 100F or higher   FOLLOW UP: If any biopsies were taken you will be contacted by phone or by letter within the next 1-3 weeks.  Call your gastroenterologist if you have not heard about the biopsies in 3 weeks.  Our staff will call the home number listed on your records the next business day following your procedure to check on you and address any questions or concerns that you may have at that time regarding the information given to you following your procedure. This is a courtesy call and so if there is no answer at the home number and we have not heard from you through the emergency physician on call, we will assume that you have returned to your regular daily activities without incident.  SIGNATURES/CONFIDENTIALITY: You and/or your care partner have signed paperwork which will be entered into your electronic medical record.  These signatures attest to the fact that that the information above on your After Visit Summary has been reviewed and is understood.  Full responsibility of the confidentiality of   this discharge information lies with you and/or your care-partner.  Polyps, diverticulosis, high fiber diet-handouts given  Repeat colonoscopy will be determined by pathology.  

## 2012-06-12 NOTE — Progress Notes (Signed)
Called to room to assist during endoscopic procedure.  Patient ID and intended procedure confirmed with present staff. Received instructions for my participation in the procedure from the performing physician.  

## 2012-06-12 NOTE — Progress Notes (Signed)
NO EGG OR SOY ALLERGY. EWM 

## 2012-06-12 NOTE — Progress Notes (Signed)
Patient did not experience any of the following events: a burn prior to discharge; a fall within the facility; wrong site/side/patient/procedure/implant event; or a hospital transfer or hospital admission upon discharge from the facility. (G8907) Patient did not have preoperative order for IV antibiotic SSI prophylaxis. (G8918)  

## 2012-06-13 ENCOUNTER — Telehealth: Payer: Self-pay | Admitting: *Deleted

## 2012-06-13 NOTE — Telephone Encounter (Signed)
  Follow up Call-  Call back number 06/12/2012 05/10/2012  Post procedure Call Back phone  # [phone], [phone]  Permission to leave phone message Yes Yes     Patient questions:  Do you have a fever, pain , or abdominal swelling? no Pain Score  0 *  Have you tolerated food without any problems? yes  Have you been able to return to your normal activities? yes  Do you have any questions about your discharge instructions: Diet   no Medications  no Follow up visit  no  Do you have questions or concerns about your Care? no  Actions: * If pain score is 4 or above: No action needed, pain <4.

## 2012-06-19 ENCOUNTER — Encounter (HOSPITAL_COMMUNITY)
Admission: RE | Admit: 2012-06-19 | Discharge: 2012-06-19 | Disposition: A | Discharge status: Home / Self Care | Payer: Managed Care, Other (non HMO) | Source: Ambulatory Visit | Attending: Gastroenterology | Admitting: Gastroenterology

## 2012-06-19 ENCOUNTER — Encounter: Payer: Self-pay | Admitting: Gastroenterology

## 2012-06-19 DIAGNOSIS — K219 Gastro-esophageal reflux disease without esophagitis: Secondary | ICD-10-CM

## 2012-06-19 DIAGNOSIS — K3189 Other diseases of stomach and duodenum: Secondary | ICD-10-CM | POA: Insufficient documentation

## 2012-06-19 MED ORDER — TECHNETIUM TC 99M SULFUR COLLOID
2.1000 | Freq: Once | INTRAVENOUS | Status: AC | PRN
  Administered 2012-06-19: 2.1 via INTRAVENOUS

## 2012-06-20 ENCOUNTER — Other Ambulatory Visit: Payer: Self-pay | Admitting: Gastroenterology

## 2012-06-20 MED ORDER — METOCLOPRAMIDE HCL 10 MG PO TABS: ORAL_TABLET | ORAL | Status: DC

## 2012-06-20 NOTE — Progress Notes (Signed)
Quick Note:  Patient has borderline gastroparesis. Let's try Reglan 10 mg one half hour a.c. and at bedtime. Office visit 4 weeks  Instruct patient to contact me immediately if she develops any side effects from her Reglan including paresthesias, tremors, confusion , weakness or muscle spasms.  ______

## 2012-07-11 ENCOUNTER — Telehealth: Payer: Self-pay

## 2012-07-11 MED ORDER — LINACLOTIDE 145 MCG PO CAPS: 145.0000 ug | ORAL_CAPSULE | Freq: Every day | ORAL | Status: DC

## 2012-07-11 NOTE — Telephone Encounter (Signed)
Spoke with pt and she is aware, states she is still having constipation. Pt has already stopped the Reglan. Samples of linzess left for pt to pick up. Pt already has OV scheduled.

## 2012-07-11 NOTE — Telephone Encounter (Signed)
Message copied by Chrystie Nose on Wed Jul 11, 2012 10:55 AM ------      Message from: Melvia Heaps D      Created: Wed Jul 11, 2012  9:26 AM      Contact: [phone]       If pt still is suffering from constipation let's try linzess 145ug qd.      D/c reglan.      OV 2-3 weeks      ----- Message -----         From: Felipa Evener         Sent: 07/10/2012   8:52 AM           To: Louis Meckel, MD            The above patient was referred for constipation study. She had a gastric emptying study on 06-19-2012. You stated that she had borderline gastroparesis, which is an exclusion to the study. You placed her on Reglan and she stated that this is not helping her. Please advise to your patient other options.       ------

## 2012-07-23 ENCOUNTER — Ambulatory Visit: Payer: Managed Care, Other (non HMO) | Admitting: Gastroenterology

## 2014-01-20 ENCOUNTER — Encounter: Payer: Self-pay | Admitting: Gastroenterology

## 2014-01-20 ENCOUNTER — Telehealth: Payer: Self-pay | Admitting: Gastroenterology

## 2014-01-20 NOTE — Telephone Encounter (Signed)
She has an appointment with Dr Deatra Ina 01/30/14 for this problem, but she is so uncomfortable she wants to be seen sooner. She has reflux and a constant burning mid-epigastric area. She gets indigestion with water. She states she is on a stomach medication. Appointment made with [employer], [place]. Encouraged to bring her medication bottles with her to make an accurate medication list.

## 2014-01-21 ENCOUNTER — Ambulatory Visit (INDEPENDENT_AMBULATORY_CARE_PROVIDER_SITE_OTHER): Payer: 59 | Admitting: Nurse Practitioner

## 2014-01-21 ENCOUNTER — Other Ambulatory Visit (INDEPENDENT_AMBULATORY_CARE_PROVIDER_SITE_OTHER): Payer: 59

## 2014-01-21 ENCOUNTER — Encounter: Payer: Self-pay | Admitting: Nurse Practitioner

## 2014-01-21 VITALS — BP 140/80 | HR 68 | Temp 98.2°F | Ht 64.25 in | Wt 110.0 lb

## 2014-01-21 DIAGNOSIS — K219 Gastro-esophageal reflux disease without esophagitis: Secondary | ICD-10-CM

## 2014-01-21 DIAGNOSIS — R1013 Epigastric pain: Secondary | ICD-10-CM

## 2014-01-21 DIAGNOSIS — R6883 Chills (without fever): Secondary | ICD-10-CM

## 2014-01-21 LAB — CBC WITH DIFFERENTIAL/PLATELET
BASOS ABS: 0 10*3/uL (ref 0.0–0.1)
BASOS PCT: 0.3 % (ref 0.0–3.0)
EOS ABS: 0.1 10*3/uL (ref 0.0–0.7)
Eosinophils Relative: 1.2 % (ref 0.0–5.0)
HEMATOCRIT: 44.5 % (ref 36.0–46.0)
Hemoglobin: 14.4 g/dL (ref 12.0–15.0)
Lymphocytes Relative: 53.7 % — ABNORMAL HIGH (ref 12.0–46.0)
Lymphs Abs: 5.5 10*3/uL — ABNORMAL HIGH (ref 0.7–4.0)
MCHC: 32.3 g/dL (ref 30.0–36.0)
MCV: 90 fl (ref 78.0–100.0)
MONO ABS: 0.9 10*3/uL (ref 0.1–1.0)
MONOS PCT: 8.3 % (ref 3.0–12.0)
NEUTROS PCT: 36.5 % — AB (ref 43.0–77.0)
Neutro Abs: 3.7 10*3/uL (ref 1.4–7.7)
Platelets: 191 10*3/uL (ref 150.0–400.0)
RBC: 4.95 Mil/uL (ref 3.87–5.11)
RDW: 12.5 % (ref 11.5–15.5)
WBC: 10.3 10*3/uL (ref 4.0–10.5)

## 2014-01-21 LAB — COMPREHENSIVE METABOLIC PANEL
ALBUMIN: 3.7 g/dL (ref 3.5–5.2)
ALK PHOS: 89 U/L (ref 39–117)
ALT: 30 U/L (ref 0–35)
AST: 33 U/L (ref 0–37)
BUN: 13 mg/dL (ref 6–23)
CO2: 32 mEq/L (ref 19–32)
Calcium: 9.5 mg/dL (ref 8.4–10.5)
Chloride: 108 mEq/L (ref 96–112)
Creatinine, Ser: 0.81 mg/dL (ref 0.40–1.20)
GFR: 93.79 mL/min (ref >60.00)
GLUCOSE: 100 mg/dL — AB (ref 70–99)
POTASSIUM: 4.7 meq/L (ref 3.5–5.1)
Sodium: 141 mEq/L (ref 135–145)
Total Bilirubin: 0.5 mg/dL (ref 0.2–1.2)
Total Protein: 7 g/dL (ref 6.0–8.3)

## 2014-01-21 MED ORDER — SUCRALFATE 1 G PO TABS: ORAL_TABLET | ORAL | Status: DC

## 2014-01-21 MED ORDER — SUCRALFATE 1 GM/10ML PO SUSP: 1.0000 g | Freq: Three times a day (TID) | ORAL | Status: DC

## 2014-01-21 NOTE — Patient Instructions (Addendum)
Please go to the basement level to have your labs drawn.  Take the Zegerid 1 tab twice daily. We sent a prescription for Carafate liquid to [facility] made a follow up appointment with Dr Deatra Ina on 03-12-2014 at 9:00 am.

## 2014-01-22 DIAGNOSIS — R1013 Epigastric pain: Secondary | ICD-10-CM | POA: Insufficient documentation

## 2014-01-22 NOTE — Progress Notes (Signed)
     History of Present Illness:  Patient is a 59 year old female known to Dr. Deatra Ina for history of GERD / esophagitis / gastritis (EGD 2014). She is worked in today for epigastric burning and reflux. She regurgitates food particles sometimes. Patient decided to increasd Zegerid to 3 times daily but had no improvement . She explains that Nexium, Prevacid nor H2-blockers work for her. Patient also complains of chills and ringing in her ears. The last time ear were ringing she had an infected tooth. Believes she has an infection somewhere because ears ringing again. She takes Aleve but only for the last 4 days.  Current Medications, Allergies, Past Medical History, Past Surgical History, Family History and Social History were reviewed in [employer] record.  Physical Exam: General: Pleasant, thin black female in no acute distress Head: Normocephalic and atraumatic Eyes:  sclerae anicteric, conjunctiva pink  Ears: Normal auditory acuity Lungs: Clear throughout to auscultation Heart: Regular rate and rhythm Abdomen: Soft, non distended, diffuse tenderness to even very light palpation.  No obvious masses, no hepatomegaly. Normal bowel sounds Musculoskeletal: Symmetrical with no gross deformities  Extremities: No edema  Neurological: Alert oriented x 4, grossly nonfocal Psychological:  Alert and cooperative. Normal mood and affect  Assessment and Recommendations:  59 year old female with history of GERD / esophagitis / gastritis here for epigastric burning / reflux. She self increased Zegerid to TID but it hasn't really helped. Nexium, Prevacid, nor H2-blockers have worked in the past. Recommended patient decrease Zegerid to BID as I don't TID dosing is beneficial nor cost-effective. Will add Carafate 3 times daily before meals and at bedtime. Patient already has follow-up scheduled with Dr. Deatra Ina later this month which have asked her to keep

## 2014-01-23 NOTE — Progress Notes (Signed)
Reviewed and agree with management. Barlow Harrison D. Lupe Handley, M.D., FACG  

## 2014-01-30 ENCOUNTER — Ambulatory Visit: Payer: Managed Care, Other (non HMO) | Admitting: Gastroenterology

## 2014-02-12 ENCOUNTER — Telehealth: Payer: Self-pay | Admitting: Gastroenterology

## 2014-02-12 NOTE — Telephone Encounter (Signed)
Seen by Angela Villanueva in January. She was put on Zegrid BID and Carafate was added. Her appointment with Dr Deatra Ina on 01/30/14 was cancelled. Patient did not say why. She calls today due to a sudden increase again in her GERD and abdominal pain. She took her ;ast Carafate 2 days ago. She feels it was not improving her symptoms, but once it was stopped she feels she definitely got worse. She declines to resume the Carafate. Wants to see Dr Deatra Ina. Appointment made for her. Afebrile. No bloody emesis or stools.

## 2014-02-14 ENCOUNTER — Ambulatory Visit (INDEPENDENT_AMBULATORY_CARE_PROVIDER_SITE_OTHER): Payer: 59 | Admitting: Gastroenterology

## 2014-02-14 ENCOUNTER — Encounter: Payer: Self-pay | Admitting: Gastroenterology

## 2014-02-14 DIAGNOSIS — R1013 Epigastric pain: Secondary | ICD-10-CM

## 2014-02-14 MED ORDER — METOCLOPRAMIDE HCL 10 MG PO TABS: ORAL_TABLET | ORAL | Status: DC

## 2014-02-14 NOTE — Patient Instructions (Signed)
Use Antiacids for burning  We are sending in a prescription to your pharmacy Call back in one week to report progress

## 2014-02-14 NOTE — Assessment & Plan Note (Signed)
Patient continues to complain of epigastric and chest burning despite PPI therapy.  Carafate did not help.  She has a history of borderline gastroparesis.  This may be contributed.  Nonacid reflux is a possibility.  She may have a functional component as well.  Recommendations #1 trial of Reglan one half hour before meals and at bedtime #2 and asked when necessary #3 continue Zegerid #4 to consider hyomax as needed if the above measures are not effective #5 to consider esophageal pH testing with impedance plethysmography while patient is on PPI therapy pending above   patient was instructed to  contact me immediately  if she develops any side effects from her Reglan including paresthesias, tremors, confusion , weakness or muscle spasms.

## 2014-02-14 NOTE — Progress Notes (Signed)
      History of Present Illness:  Ms. Matthew continues to complain of burning upper epigastric discomfort and chest burning.  She says that she throws up about 3 times a week.  She discontinued Carafate because it was ineffective.  She continues onset GERD.  She has a sensation of globus.  Of note, a gastric emptying scan in June, 2014 demonstrated mild delay in gastric emptying ( 32% retention at 120 minutes)    Review of Systems: Pertinent positive and negative review of systems were noted in the above HPI section. All other review of systems were otherwise negative.    Current Medications, Allergies, Past Medical History, Past Surgical History, Family History and Social History were reviewed in [facility] record  Vital signs were reviewed in today's medical record. Physical Exam: General: Well developed , well nourished, no acute distress   See Assessment and Plan under Problem List

## 2014-02-21 ENCOUNTER — Telehealth: Payer: Self-pay | Admitting: Gastroenterology

## 2014-02-21 NOTE — Telephone Encounter (Signed)
Patient advised. Appointment for 03/12/14 cancelled. New appointment scheduled and card mailed to the patient

## 2014-02-21 NOTE — Telephone Encounter (Signed)
Continue Reglan This visit 3 months

## 2014-03-12 ENCOUNTER — Ambulatory Visit: Payer: 59 | Admitting: Gastroenterology

## 2014-05-14 ENCOUNTER — Ambulatory Visit: Payer: 59 | Admitting: Gastroenterology

## 2014-06-27 ENCOUNTER — Ambulatory Visit: Payer: 59 | Admitting: Gastroenterology

## 2015-05-11 ENCOUNTER — Encounter: Payer: Self-pay | Admitting: Gastroenterology

## 2015-08-10 ENCOUNTER — Telehealth: Payer: Self-pay | Admitting: Gastroenterology

## 2015-08-10 NOTE — Telephone Encounter (Signed)
Pt has been having a return of Angela Villanueva, she has been advised to take her zegerid as directed and was given reflux precautions.  She will follow up with PCP and keep appt for 09/08/15 as scheduled.

## 2015-08-11 ENCOUNTER — Encounter (HOSPITAL_COMMUNITY): Payer: Self-pay | Admitting: Emergency Medicine

## 2015-08-11 ENCOUNTER — Emergency Department (HOSPITAL_COMMUNITY): Payer: Self-pay

## 2015-08-11 ENCOUNTER — Telehealth: Payer: Self-pay

## 2015-08-11 ENCOUNTER — Emergency Department (HOSPITAL_COMMUNITY)
Admission: EM | Admit: 2015-08-11 | Discharge: 2015-08-11 | Disposition: A | Discharge status: Home / Self Care | Payer: Self-pay | Attending: Emergency Medicine | Admitting: Emergency Medicine

## 2015-08-11 DIAGNOSIS — Z87891 Personal history of nicotine dependence: Secondary | ICD-10-CM | POA: Insufficient documentation

## 2015-08-11 DIAGNOSIS — R1084 Generalized abdominal pain: Secondary | ICD-10-CM | POA: Insufficient documentation

## 2015-08-11 DIAGNOSIS — Z79899 Other long term (current) drug therapy: Secondary | ICD-10-CM | POA: Insufficient documentation

## 2015-08-11 DIAGNOSIS — R112 Nausea with vomiting, unspecified: Secondary | ICD-10-CM | POA: Insufficient documentation

## 2015-08-11 LAB — CBC
HEMATOCRIT: 40.9 % (ref 36.0–46.0)
Hemoglobin: 13.7 g/dL (ref 12.0–15.0)
MCH: 29 pg (ref 26.0–34.0)
MCHC: 33.5 g/dL (ref 30.0–36.0)
MCV: 86.5 fL (ref 78.0–100.0)
Platelets: 178 10*3/uL (ref 150–400)
RBC: 4.73 MIL/uL (ref 3.87–5.11)
RDW: 12.7 % (ref 11.5–15.5)
WBC: 6.4 10*3/uL (ref 4.0–10.5)

## 2015-08-11 LAB — URINALYSIS, ROUTINE W REFLEX MICROSCOPIC
Bilirubin Urine: NEGATIVE
GLUCOSE, UA: NEGATIVE mg/dL
HGB URINE DIPSTICK: NEGATIVE
Ketones, ur: NEGATIVE mg/dL
LEUKOCYTES UA: NEGATIVE
Nitrite: NEGATIVE
PH: 7 (ref 5.0–8.0)
Protein, ur: NEGATIVE mg/dL
Specific Gravity, Urine: 1.007 (ref 1.005–1.030)

## 2015-08-11 LAB — COMPREHENSIVE METABOLIC PANEL
ALT: 30 U/L (ref 14–54)
AST: 39 U/L (ref 15–41)
Albumin: 4.1 g/dL (ref 3.5–5.0)
Alkaline Phosphatase: 73 U/L (ref 38–126)
Anion gap: 6 (ref 5–15)
BUN: 13 mg/dL (ref 6–20)
CHLORIDE: 106 mmol/L (ref 101–111)
CO2: 29 mmol/L (ref 22–32)
Calcium: 9.5 mg/dL (ref 8.9–10.3)
Creatinine, Ser: 0.87 mg/dL (ref 0.44–1.00)
GFR calc Af Amer: >60 mL/min (ref >60)
Glucose, Bld: 94 mg/dL (ref 65–99)
POTASSIUM: 4.6 mmol/L (ref 3.5–5.1)
SODIUM: 141 mmol/L (ref 135–145)
Total Bilirubin: 1.1 mg/dL (ref 0.3–1.2)
Total Protein: 7.5 g/dL (ref 6.5–8.1)

## 2015-08-11 LAB — LIPASE, BLOOD: LIPASE: 33 U/L (ref 11–51)

## 2015-08-11 MED ORDER — SODIUM CHLORIDE 0.9 % IV BOLUS (SEPSIS)
1000.0000 mL | Freq: Once | INTRAVENOUS | Status: AC
  Administered 2015-08-11: 1000 mL via INTRAVENOUS

## 2015-08-11 MED ORDER — POLYETHYLENE GLYCOL 3350 17 G PO PACK: 17.0000 g | PACK | Freq: Two times a day (BID) | ORAL | 0 refills | Status: AC

## 2015-08-11 MED ORDER — SORBITOL 70 % SOLN
960.0000 mL | TOPICAL_OIL | Freq: Once | ORAL | Status: AC
  Administered 2015-08-11: 960 mL via RECTAL
  Filled 2015-08-11: qty 240

## 2015-08-11 MED ORDER — SUCRALFATE 1 GM/10ML PO SUSP: 1.0000 g | Freq: Three times a day (TID) | ORAL | 0 refills | Status: DC

## 2015-08-11 MED ORDER — DIATRIZOATE MEGLUMINE & SODIUM 66-10 % PO SOLN
30.0000 mL | Freq: Once | ORAL | Status: AC
  Administered 2015-08-11: 30 mL via ORAL

## 2015-08-11 MED ORDER — SUCRALFATE 1 GM/10ML PO SUSP
1.0000 g | Freq: Once | ORAL | Status: AC
  Administered 2015-08-11: 1 g via ORAL
  Filled 2015-08-11: qty 10

## 2015-08-11 MED ORDER — MORPHINE SULFATE (PF) 4 MG/ML IV SOLN
4.0000 mg | Freq: Once | INTRAVENOUS | Status: AC
  Administered 2015-08-11: 4 mg via INTRAVENOUS
  Filled 2015-08-11: qty 1

## 2015-08-11 MED ORDER — ONDANSETRON HCL 4 MG/2ML IJ SOLN
4.0000 mg | Freq: Once | INTRAMUSCULAR | Status: AC
  Administered 2015-08-11: 4 mg via INTRAVENOUS
  Filled 2015-08-11: qty 2

## 2015-08-11 MED ORDER — IOPAMIDOL (ISOVUE-300) INJECTION 61%
100.0000 mL | Freq: Once | INTRAVENOUS | Status: AC | PRN
  Administered 2015-08-11: 80 mL via INTRAVENOUS

## 2015-08-11 NOTE — ED Notes (Signed)
PT DISCHARGED. INSTRUCTIONS AND PRESCRIPTIONS GIVEN. AAOX4. PT IN NO APPARENT DISTRESS OR PAIN. THE OPPORTUNITY TO ASK QUESTIONS WAS PROVIDED. 

## 2015-08-11 NOTE — Progress Notes (Signed)
Entered in d/c instructions Erskine Emery, MD  Gastroenterology      Next Steps: Go on 09/08/2015  Instructions:  [place], [place], [place] [address] [phone]  partnership for community care network [facility]    Next Steps: Call on 08/14/2015  Instructions: call as needed on 08/14/15 if you have not been called or receive a postcard A referral was sent to this company to have someone to reach out to you about their orange card program process while you where in [facility]

## 2015-08-11 NOTE — ED Provider Notes (Signed)
[facility] DEPT Provider Note   CSN: [id number] Arrival date & time: 08/11/15  [id number]  First Provider Contact:  First MD Initiated Contact with Patient 08/11/15 [phone]        History   Chief Complaint Chief Complaint  Patient presents with  . Abdominal Pain    HPI Angela Villanueva is a 60 y.o. female.   Abdominal Pain   Associated symptoms include nausea and vomiting.   Patient presents with concern of abdominal pain. Onset was 3 days ago, since onset has been persistent. Pain is focally in the right upper abdomen, suprapubic region, consistent with diffuse radiation. Pain is sore, severe, not improved with continued use of home medication, no additional OTC medication attempted. There is associated anorexia, nausea, vomiting, no diarrhea, no change in bowel movements. No urinary complaints. No fever, chills. Patient denies history of abdominal surgery, acknowledges history of GERD, taking multiple medications for this.  Past Medical History:  Diagnosis Date  . Allergy   . Anemia   . Gastric ulcer   . GERD (gastroesophageal reflux disease)     Patient Active Problem List   Diagnosis Date Noted  . Epigastric burning sensation 01/22/2014  . Esophageal reflux 05/02/2012  . Dysphagia, unspecified(787.20) 05/02/2012  . DUODENITIS, WITHOUT HEMORRHAGE 04/09/2007  . CONSTIPATION 04/09/2007  . GASTRIC ULCER, HX OF 04/09/2007    Past Surgical History:  Procedure Laterality Date  . ABDOMINAL HYSTERECTOMY    . fibroid tumor removal    . LAPAROSCOPIC GASTROTOMY W/ REPAIR OF ULCER    . TONSILLECTOMY     AS CHILD    OB History    No data available       Home Medications    Prior to Admission medications   Medication Sig Start Date End Date Taking? Authorizing Provider  Omeprazole-Sodium Bicarbonate (ZEGERID) 20-1100 MG CAPS capsule Take 1 capsule by mouth daily before breakfast.   Yes Historical Provider, MD    Family History Family History  Problem  Relation Age of Onset  . COPD Mother     Social History Social History  Substance Use Topics  . Smoking status: Former Smoker    Quit date: 01/04/1992  . Smokeless tobacco: Never Used  . Alcohol use No     Allergies   Ampicillin and Penicillins   Review of Systems Review of Systems  Constitutional:       Per HPI, otherwise negative  HENT:       Per HPI, otherwise negative  Respiratory:       Per HPI, otherwise negative  Cardiovascular:       Per HPI, otherwise negative  Gastrointestinal: Positive for abdominal pain, nausea and vomiting.  Endocrine:       Negative aside from HPI  Genitourinary:       Neg aside from HPI   Musculoskeletal:       Per HPI, otherwise negative  Skin: Negative.   Neurological: Negative for syncope.     Physical Exam Updated Vital Signs BP 142/92 (BP Location: Right Arm)   Pulse (!) 59   Temp 97.6 F (36.4 C) (Oral)   Resp 16   Ht 5\' 5"  (1.651 m)   Wt 120 lb (54.4 kg)   SpO2 100%   BMI 19.97 kg/m   Physical Exam  Constitutional: She is oriented to person, place, and time. She appears well-developed and well-nourished. No distress.  HENT:  Head: Normocephalic and atraumatic.  Eyes: Conjunctivae and EOM are normal.  Cardiovascular: Normal rate and  regular rhythm.   Pulmonary/Chest: Effort normal and breath sounds normal. No stridor. No respiratory distress.  Abdominal: Normal appearance. She exhibits no distension. There is tenderness in the right upper quadrant, periumbilical area and suprapubic area.  Musculoskeletal: She exhibits no edema.  Neurological: She is alert and oriented to person, place, and time. No cranial nerve deficit.  Skin: Skin is warm and dry.  Psychiatric: She has a normal mood and affect.  Nursing note and vitals reviewed.    ED Treatments / Results  Labs (all labs ordered are listed, but only abnormal results are displayed) Labs Reviewed  LIPASE, BLOOD  COMPREHENSIVE METABOLIC PANEL  CBC    URINALYSIS, ROUTINE W REFLEX MICROSCOPIC (NOT AT [facility])     Radiology Ct Abdomen Pelvis W Contrast  Result Date: 08/11/2015 CLINICAL DATA:  Sharp knife like RIGHT upper quadrant and low medial abdominal pain since Saturday worse with bowel movements, nausea with biliary emesis, pain those throughout abdomen, history GERD, gastric ulcer, duodenitis, former smoker EXAM: CT ABDOMEN AND PELVIS WITH CONTRAST TECHNIQUE: Multidetector CT imaging of the abdomen and pelvis was performed using the standard protocol following bolus administration of intravenous contrast. Sagittal and coronal MPR images reconstructed from axial data set. CONTRAST:  77mL ISOVUE-300 IOPAMIDOL (ISOVUE-300) INJECTION 61% IV. Dilute oral contrast. COMPARISON:  None FINDINGS: Lower chest:  Minimal subsegmental atelectasis LEFT lower lobe. Hepatobiliary: Gallbladder and liver normal appearance Pancreas: Normal appearance Spleen: Normal appearance Adrenals/Urinary Tract: Adrenal glands normal appearance. Kidneys, ureters and bladder normal appearance. Stomach/Bowel: Multiple ovoid radio opacities throughout colon compatible with medication tablets, 15-20 in number. Fluid in colon to rectum. Low lying cecum in the RIGHT pelvis. Bowel loops otherwise normal appearance. Contraction versus postsurgical changes at the mid stomach with change in caliber between proximal and distal stomach though no outlet obstruction is seen. Duodenal bulb and sweep normal appearance. No definite gastric mass or ulcer identified. Vascular/Lymphatic: Atherosclerotic calcification aorta. No adenopathy. Reproductive: Uterus surgically absent with normal sized ovaries. Other: No free air or free fluid.  No hernia. Musculoskeletal: Bones unremarkable. IMPRESSION: Numerous medication tablets throughout colon. Question postsurgical changes the mid stomach versus contraction, without evidence of gastric outlet obstruction. Aortic atherosclerosis. Electronically Signed   By:  Lavonia Dana M.D.   On: 08/11/2015 10:24    Procedures Procedures (including critical care time)  Medications Ordered in ED Medications  sucralfate (CARAFATE) 1 GM/10ML suspension 1 g (not administered)  sodium chloride 0.9 % bolus 1,000 mL (0 mLs Intravenous Stopped 08/11/15 1128)  ondansetron (ZOFRAN) injection 4 mg (4 mg Intravenous Given 08/11/15 0814)  morphine 4 MG/ML injection 4 mg (4 mg Intravenous Given 08/11/15 0814)  diatrizoate meglumine-sodium (GASTROGRAFIN) 66-10 % solution 30 mL (30 mLs Oral Given 08/11/15 0823)  iopamidol (ISOVUE-300) 61 % injection 100 mL (80 mLs Intravenous Contrast Given 08/11/15 1003)     Initial Impression / Assessment and Plan / ED Course  I have reviewed the triage vital signs and the nursing notes.  Pertinent labs & imaging results that were available during my care of the patient were reviewed by me and considered in my medical decision making (see chart for details).  Clinical Course    12:06 PM Patient calm. We discussed all findings and he demonstrated the CT images to the patient With multiple retained foreign bodies consistent with pills, there are some suspicion for worsening of her gastroparesis. Patient requests additional medicine for GERD.  I discussed patient's case with her gastroenterology team. We agreed to facilitate a follow-up,  and patient will have appointment made. I also discussed appropriate discharge medication, after enema here.  3:15 PM Patient appears comfortable, has had several bowel movements.  Final Clinical Impressions(s) / ED Diagnoses  Patient presents with concern of abdominal pain in multiple areas per Patient has a history of gastroparesis, has involvement with gastroenterology, and has had not had any relief in spite of multiple medications. Here the patient is on her multiple retained pills, and appearance of a possible bezoar in the rectal vault. Patient had subsequent improvement following enema. With no  evidence for obstruction, and after discussion with her gastroenterology team, the patient was discharged on a new bowel movement regimen, to follow-up in the clinic in the coming days.   Carmin Muskrat, MD 08/11/15 [phone]

## 2015-08-11 NOTE — ED Triage Notes (Signed)
Pt c/o sharp, knife-like RUQ and low medial abdominal pain onset Saturday, worse with bowel movements, relieved some with splinting. Nausea with bile emesis. Pain moves throughout abdomen.

## 2015-08-11 NOTE — Progress Notes (Signed)
ED CM noted pt with c/o of GERD/abdominal issues  ED CM noted in chart review that the pt called Dr Deatra Ina office 08/10/15 and RN noted pt to go to pcp and come to [facility] on 09/08/15 appt  Cm asked pt about her pcp name and she states she does not have a pcp nor insurance Pt proceeds to tell Cm she has seen Dr Ricke Hey "years ago" "over fifteen years ago" Sttes she spoke with some in his office and he would not be able to see her until "october"  Pt not sure if she wants to return to see Dr Alyson Ingles CM spoke with pt who confirms uninsured [employer] resident with no pcp.  CM discussed and provided written information to assist pt with determining choice for uninsured accepting pcps, discussed the importance of pcp vs EDP services for f/u care, www.needymeds.org, www.goodrx.com, discounted pharmacies and other [employer] such as [employer] [facility] [employer], affordable care act, financial assistance, uninsured dental services, [place] med assist, DSS and  health department  Reviewed resources for [employer] uninsured accepting pcps like [facility], family medicine at [employer], [facility], [facility], local urgent care centers, [facility], [facility] family practice, general medical clinics, family services of the [place], [facility] urgent care plus others, medication resources, [facility] out patient pharmacies and housing Pt voiced understanding and appreciation of resources provided   Provided [facility] contact information Pt agreed to a referral Cm completed referral Pt to be contact by [facility] clinical liaison

## 2015-08-11 NOTE — Telephone Encounter (Signed)
appt with Angela Villanueva 08/17/15 1030 am (per Jess) Left message on machine to call back

## 2015-08-13 NOTE — Telephone Encounter (Signed)
The pt is aware of the appt with Jessica on 08/17/15.

## 2015-08-17 ENCOUNTER — Encounter: Payer: Self-pay | Admitting: Gastroenterology

## 2015-08-17 ENCOUNTER — Ambulatory Visit (INDEPENDENT_AMBULATORY_CARE_PROVIDER_SITE_OTHER): Payer: 59 | Admitting: Gastroenterology

## 2015-08-17 VITALS — BP 170/90 | HR 68 | Ht 64.25 in | Wt 121.5 lb

## 2015-08-17 DIAGNOSIS — K2100 Gastro-esophageal reflux disease with esophagitis, without bleeding: Secondary | ICD-10-CM

## 2015-08-17 DIAGNOSIS — R1013 Epigastric pain: Secondary | ICD-10-CM

## 2015-08-17 DIAGNOSIS — K5900 Constipation, unspecified: Secondary | ICD-10-CM

## 2015-08-17 DIAGNOSIS — K3184 Gastroparesis: Secondary | ICD-10-CM

## 2015-08-17 MED ORDER — METOCLOPRAMIDE HCL 10 MG PO TABS: 10.0000 mg | ORAL_TABLET | Freq: Three times a day (TID) | ORAL | 2 refills | Status: DC

## 2015-08-17 MED ORDER — LINACLOTIDE 145 MCG PO CAPS: 145.0000 ug | ORAL_CAPSULE | Freq: Every day | ORAL | 3 refills | Status: DC

## 2015-08-17 MED ORDER — PEG 3350-KCL-NABCB-NACL-NASULF 236 G PO SOLR: ORAL | 0 refills | Status: DC

## 2015-08-17 NOTE — Patient Instructions (Addendum)
We sent prescriptions to [facility]  1. Linzess 145 mg 2. Reglan 10 mg 3. Go Lytely prep to help purge your bowels.

## 2015-08-31 ENCOUNTER — Encounter: Payer: Self-pay | Admitting: Gastroenterology

## 2015-08-31 NOTE — Progress Notes (Signed)
     08/17/2015 Angela Villanueva [id number] 07/12/1955   History of Present Illness:  This is a 60 year old female who is presenting known to Dr. Deatra Ina. Her care will be assumed by Dr. Loletha Carrow in Dr. Kelby Fam absence.  She has history of GERD and mild gastroparesis as documented by gastric emptying scan in June 2014. 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 pylori. Colonoscopy in June 2014 showed diverticulosis and a couple of polyps removed, both adenomatous and hyperplastic; recall for 5 years, June 2019.  She is here today for ER follow-up after being seen or 6 days ago for upper abdominal pain.  CBC, CMP, lipase WNL's.  CT scan of the abdomen and pelvis with contrast on 8/8 showed the following:  IMPRESSION: Numerous medication tablets throughout colon.  Question postsurgical changes the mid stomach versus contraction, without evidence of gastric outlet obstruction.  Aortic atherosclerosis.  She tells me "my food is not digesting ".  Complaining of upper abdominal pain.  She is on zegerid dialy and carafate 4 times per day.  She is requesting Reglan for her symptoms and says that it helped her in the past.  Not sure why it was discontinued.  Also complaining of constipation.  Currently taking Miralax without good results.   Current Medications, Allergies, Past Medical History, Past Surgical History, Family History and Social History were reviewed in [employer] record.   Physical Exam: BP (!) 170/90 (BP Location: Left Arm, Patient Position: Sitting, Cuff Size: Normal)   Pulse 68   Ht 5' 4.25" (1.632 m)   Wt 121 lb 8 oz (55.1 kg)   BMI 20.69 kg/m  General: Well developed female in no acute distress Head: Normocephalic and atraumatic Eyes:  Sclerae anicteric, conjunctiva pink  Ears: Normal auditory acuity Lungs: Clear throughout to  auscultation Heart: Regular rate and rhythm Abdomen: Soft, non-distended.  BS present.  Diffuse TTP. Musculoskeletal: Symmetrical with no gross deformities  Extremities: No edema  Neurological: Alert oriented x 4, grossly non-focal Psychological:  Alert and cooperative. Normal mood and affect  Assessment and Recommendations: -Gastroparesis:  Likely the cause of her upper abdominal pain.  Will restart Reglan 10 mg 3 times daily before meals. I discussed with her the long-term side effects of medication including irreversible tremors. She is to contact us immediately and stop the medication if she notices anything unusual to occur. She will follow up with Dr. Loletha Carrow in September at which time if the medication is working could consider switching over to domperidone for long term treatment. -Constipation:  We'll give bowel purge with GoLYTELY bowel prep.  Then we'll start Linzess 145 mcg daily.

## 2015-09-01 DIAGNOSIS — K3184 Gastroparesis: Secondary | ICD-10-CM | POA: Insufficient documentation

## 2015-09-02 NOTE — Progress Notes (Signed)
Thank you for sending this case to me. I have reviewed the entire note, and the outlined plan seems appropriate.  I will see her next month and assess the response to reglan.  In the future, consider starting lower dose reglan to decrease risk of side effects.

## 2015-09-08 ENCOUNTER — Encounter: Payer: Self-pay | Admitting: Gastroenterology

## 2015-09-08 ENCOUNTER — Ambulatory Visit (INDEPENDENT_AMBULATORY_CARE_PROVIDER_SITE_OTHER): Payer: Self-pay | Admitting: Gastroenterology

## 2015-09-08 VITALS — BP 130/80 | HR 76 | Ht 65.0 in | Wt 118.0 lb

## 2015-09-08 DIAGNOSIS — K5901 Slow transit constipation: Secondary | ICD-10-CM

## 2015-09-08 DIAGNOSIS — K219 Gastro-esophageal reflux disease without esophagitis: Secondary | ICD-10-CM

## 2015-09-08 DIAGNOSIS — R1084 Generalized abdominal pain: Secondary | ICD-10-CM

## 2015-09-08 MED ORDER — ONDANSETRON HCL 4 MG PO TABS
4.0000 mg | ORAL_TABLET | Freq: Three times a day (TID) | ORAL | 1 refills | Status: DC | PRN
Start: 2015-09-08 — End: 2016-10-18

## 2015-09-08 NOTE — Progress Notes (Addendum)
[facility] Progress Note  Chief Complaint: Generalized abdominal pain and vomiting  Subjective  History:  This is a 60 year old woman seen me for the first time. She previously saw Dr. Deatra Ina in 2016, and most recently saw one of our PAs weeks ago. Chronic has many years of chronic GERD symptoms with breakthrough heartburn despite high-dose acid suppression. She denies hematemesis or dysphagia. May lost a couple pounds in the last few weeks. She had a borderline positive to our gastric emptying study in 2014. This seems to been when she was initially prescribed Reglan. She does not feel that it ever helped her. During the recent visit, where she was describing epigastric pain and nausea with vomiting, she was given Reglan 10 mg 3 times a day. She reports that has not helped any symptoms. She feels nauseated and sometimes vomits, she is cared take anything more than liquids. She describes generalized abdominal pain that is of both a burning and aching quality. It did not improve at all after a recent bowel purge. She has ancillary symptoms such as her abdomen feeling "hot and feverish". Yesterday while at work she felt lightheaded and also had some numbness in her left leg. It should be noted that she was recently seen in the ED and had a negative lab and radiologic workup. Her last upper endoscopy was in 2014., And she was negative for H. pylori. ROS: Cardiovascular:  no chest pain Respiratory: no dyspnea  The patient's Past Medical, Family and Social History were reviewed and are on file in the EMR.  Objective:  Med list reviewed  Vital signs in last 24 hrs: Vitals:   09/08/15 1044  BP: 130/80  Pulse: 76    Physical Exam Thin woman who is in pain, holding her lower abdomen. She also requested a warm blanket because she had felt cold. She has a depressed affect.  HEENT: sclera anicteric, oral mucosa moist without lesions  Neck: supple, no thyromegaly, JVD or  lymphadenopathy  Cardiac: RRR without murmurs, S1S2 heard, no peripheral edema  Pulm: clear to auscultation bilaterally, normal RR and effort noted  Abdomen: soft, tenderness out of proportion to light palpation over the entire abdomen, with active bowel sounds. No guarding or palpable hepatosplenomegaly.  Skin; warm and dry, no jaundice or rash  Recent Labs:  CBC    Component Value Date/Time   WBC 6.4 08/11/2015 0758   RBC 4.73 08/11/2015 0758   HGB 13.7 08/11/2015 0758   HCT 40.9 08/11/2015 0758   PLT 178 08/11/2015 0758   MCV 86.5 08/11/2015 0758   MCH 29.0 08/11/2015 0758   MCHC 33.5 08/11/2015 0758   RDW 12.7 08/11/2015 0758   LYMPHSABS 5.5 (H) 01/21/2014 1459   MONOABS 0.9 01/21/2014 1459   EOSABS 0.1 01/21/2014 1459   BASOSABS 0.0 01/21/2014 1459   CMP Latest Ref Rng & Units 08/11/2015 01/21/2014 06/05/2008  Glucose 65 - 99 mg/dL 94 100(H) 84  BUN 6 - 20 mg/dL 13 13 7   Creatinine 0.44 - 1.00 mg/dL 0.87 0.81 0.85  Sodium 135 - 145 mmol/L 141 141 137  Potassium 3.5 - 5.1 mmol/L 4.6 4.7 4.4  Chloride 101 - 111 mmol/L 106 108 108  CO2 22 - 32 mmol/L 29 32 25  Calcium 8.9 - 10.3 mg/dL 9.5 9.5 9.2  Total Protein 6.5 - 8.1 g/dL 7.5 7.0 7.4  Total Bilirubin 0.3 - 1.2 mg/dL 1.1 0.5 0.4  Alkaline Phos 38 - 126 U/L 73 89 70  AST 15 - 41  U/L 39 33 34  ALT 14 - 54 U/L 30 30 26      Radiologic studies: CT-scan of abdomen and pelvis 08/11/15: IMPRESSION: Numerous medication tablets throughout colon.   Question postsurgical changes the mid stomach versus contraction, without evidence of gastric outlet obstruction.   Aortic atherosclerosis.    @ASSESSMENTPLANBEGIN @ Assessment: Encounter Diagnoses  Name Primary?  . Generalized abdominal pain Yes  . Slow transit constipation   . Gastroesophageal reflux disease, esophagitis presence not specified     This patient appears to have all the clinical hallmarks of a functional bowel disorder for many years. I have instructed  her to stop the metoclopramide since it is not working, and also because of the risk of tardive dyskinesia. Prescribed her Zofran as needed I believe she needs evaluation at an academic institution such as the GI motility clinic at [facility]. We will refer her there I'm not yet sure for lack of insurance will be an obstacle. I feel that the academic multidisciplinary approach including a psychological evaluation for possible underlying affective disorders would be most helpful in her case.  Curiously,as soon as I discussed all this, her affect significantly brightened and  she was no longer uncomfortable.    Total time 30 minutes, over half spent in counseling and coordination of care.   Nelida Meuse III

## 2015-09-08 NOTE — Patient Instructions (Addendum)
Stop taking metoclopramide  Start ondansetron for nausea and vomiting : prescription sent to your pharmacy.  We will try to get an appointment to the GI clinic at [facility] for further testing and treatment options.   If you are age 58 or older, your body mass index should be between 23-30. Your Body mass index is 19.64 kg/m. If this is out of the aforementioned range listed, please consider follow up with your Primary Care Provider.  If you are age 4 or younger, your body mass index should be between 19-25. Your Body mass index is 19.64 kg/m. If this is out of the aformentioned range listed, please consider follow up with your Primary Care Provider.

## 2015-09-09 ENCOUNTER — Telehealth: Payer: Self-pay | Admitting: Gastroenterology

## 2015-09-09 NOTE — Telephone Encounter (Signed)
To my knowledge, nobody has called her back yet. Dr Loletha Carrow said that he would be thinking about what physician to send her to as [facility] apparently does not take uninsured patients. We will contact her once we have an answer from him.

## 2015-09-10 NOTE — Telephone Encounter (Signed)
I was unaware that the [facility] clinic did not take uninsured patients.  That is unfortunate, since this patient would benefit from such an evaluation.  I had explained to her that I had no further medicines or therapy to offer for her chronic pain. If there is a functional bowel clinic at [facility] or [facility] that accept uninsured patients (or on sliding scale), then please make the referral.  Otherwise, I am afraid we have done all we can do.

## 2015-09-11 NOTE — Telephone Encounter (Signed)
Patient calling back regarding this. Best # [phone]

## 2015-09-14 ENCOUNTER — Telehealth: Payer: Self-pay | Admitting: Gastroenterology

## 2015-09-14 NOTE — Telephone Encounter (Signed)
The pt has been informed that [facility] does not see uninsured pt's, she is also aware that there is nothing further that Dr Loletha Carrow can offer for her chronic pain.  Pt states she is getting better and does not have as much pain any longer.  She will call back if she has any further GI complaints.

## 2015-09-16 NOTE — Telephone Encounter (Signed)
Contacted the [facility] referral dept.today.They informed me that they have a financial aid department and assign the pt a financial aid counselor, and they will discuss all the options with her. Would you like me to send a referral to the [facility]?

## 2015-09-17 NOTE — Telephone Encounter (Signed)
Yes, please do so. Thank you for checking on this.  - HD

## 2015-09-17 NOTE — Telephone Encounter (Signed)
Records faxed to [place]. Left message for pt to return call.

## 2015-09-22 NOTE — Telephone Encounter (Signed)
Per pt they have contacted her to get all her financial information. They will process this and determine if she is a candidate for the financial aid program they offer . She will contact us after they have made a decision.

## 2015-11-06 ENCOUNTER — Ambulatory Visit: Payer: Self-pay | Admitting: Nurse Practitioner

## 2015-12-08 ENCOUNTER — Encounter: Payer: Self-pay | Admitting: Nurse Practitioner

## 2015-12-08 ENCOUNTER — Ambulatory Visit (INDEPENDENT_AMBULATORY_CARE_PROVIDER_SITE_OTHER): Payer: Self-pay | Admitting: Nurse Practitioner

## 2015-12-08 VITALS — BP 114/88 | HR 61 | Temp 98.2°F | Ht 65.0 in | Wt 125.0 lb

## 2015-12-08 DIAGNOSIS — K3184 Gastroparesis: Secondary | ICD-10-CM

## 2015-12-08 DIAGNOSIS — Z8711 Personal history of peptic ulcer disease: Secondary | ICD-10-CM

## 2015-12-08 DIAGNOSIS — R1310 Dysphagia, unspecified: Secondary | ICD-10-CM

## 2015-12-08 DIAGNOSIS — R1013 Epigastric pain: Secondary | ICD-10-CM

## 2015-12-08 DIAGNOSIS — K5901 Slow transit constipation: Secondary | ICD-10-CM

## 2015-12-08 DIAGNOSIS — K219 Gastro-esophageal reflux disease without esophagitis: Secondary | ICD-10-CM

## 2015-12-08 MED ORDER — SIMETHICONE 125 MG PO CHEW: 125.0000 mg | CHEWABLE_TABLET | Freq: Four times a day (QID) | ORAL | 1 refills | Status: DC | PRN

## 2015-12-08 MED ORDER — METOCLOPRAMIDE HCL 5 MG/5ML PO SOLN: 10.0000 mg | Freq: Three times a day (TID) | ORAL | 0 refills | Status: DC

## 2015-12-08 MED ORDER — SIMETHICONE 125 MG PO CHEW: 125.0000 mg | CHEWABLE_TABLET | Freq: Four times a day (QID) | ORAL | 0 refills | Status: DC | PRN

## 2015-12-08 NOTE — Progress Notes (Signed)
Subjective:    Patient ID: Angela Villanueva, female    DOB: 1955/10/21, 60 y.o.   MRN: [id number]  Patient presents today for complete physical or establish care (new patient)  HPI  She is Self pay.  Previous pcp was Dr. Alyson Ingles (private practice), last seen over 3years ago.  GERD: uncontrolled with Zedegrid, hx of PUD  Constipation: some relief with MOM No improvement with linzess (caused more gas).  ABD pain: diagnosed with IBS and mild gastroparesis,  no improvement with probiotics or diet modification (low fat and no diary). Some improvement with reglan. She has been evaluated by Gi, but unsuccessful if finding anything that is helpful to patient. Has had endoscopy and colonoscopy is past. Recent CT ABD and pelvis found undigested tables in stomach.  Angela Villanueva will like referral to another GI specialist for second opinion.  Immunizations: (TDAP, Hep C screen, Pneumovax, Influenza, zoster): declined any immunization today Health Maintenance  Topic Date Due  .  Hepatitis C: One time screening is recommended by Center for Disease Control  (CDC) for  adults born from [birth year] through 1963.   1955/10/26  . HIV Screening  04/01/1972  . Tetanus Vaccine  04/01/1976  . Pap Smear  04/02/1978  . Mammogram  11/21/2008  . Colon Cancer Screening  06/13/2015  . Flu Shot  04/02/2016*  *Topic was postponed. The date shown is not the original due date.   Diet:low fat, no diary Weight:  Wt Readings from Last 3 Encounters:  12/08/15 125 lb (56.7 kg)  09/08/15 118 lb (53.5 kg)  08/17/15 121 lb 8 oz (55.1 kg)   Fall Risk: Fall Risk  12/08/2015  Falls in the past year? No   Home Safety:home with husband Depression/Suicide: Depression screen [facility] 2/9 12/08/2015  Decreased Interest 0  Down, Depressed, Hopeless 0  PHQ - 2 Score 0   No flowsheet data found. Colonoscopy (every 5-60yrs, >50-43yrs):2014 Dexa (every 2-49yrs, >44yrs):declined, no insurance Pap Smear (every 82yrs for  >21-29 without HPV, every 73yrs for >30-43yrs with HPV):declined Mammogram (yearly, >74yrs):declined  Advanced Directive: Advanced Directives 08/11/2015  Does Patient Have a Medical Advance Directive? No   Medications and allergies reviewed with patient and updated if appropriate.  Patient Active Problem List   Diagnosis Date Noted  . Gastroparesis 09/01/2015  . Abdominal pain, epigastric 01/22/2014  . Esophageal reflux 05/02/2012  . Dysphagia 05/02/2012  . DUODENITIS, WITHOUT HEMORRHAGE 04/09/2007  . Constipation 04/09/2007  . History of peptic ulcer disease 04/09/2007    Current Outpatient Prescriptions on File Prior to Visit  Medication Sig Dispense Refill  . Omeprazole-Sodium Bicarbonate (ZEGERID) 20-1100 MG CAPS capsule Take 2 capsules by mouth daily before breakfast.     . ondansetron (ZOFRAN) 4 MG tablet Take 1 tablet (4 mg total) by mouth every 8 (eight) hours as needed for nausea or vomiting. (Patient not taking: Reported on 12/08/2015) 30 tablet 1   No current facility-administered medications on file prior to visit.     Past Medical History:  Diagnosis Date  . Allergy   . Anemia   . Diverticulitis   . Gastric ulcer   . GERD (gastroesophageal reflux disease)     Past Surgical History:  Procedure Laterality Date  . ABDOMINAL HYSTERECTOMY    . fibroid tumor removal    . LAPAROSCOPIC GASTROTOMY W/ REPAIR OF ULCER    . PARTIAL HYSTERECTOMY    . TONSILLECTOMY     AS CHILD    Social History   Social History  .  Marital status: Married    Spouse name: N/A  . Number of children: 1  . Years of education: N/A   Occupational History  . [place] History Main Topics  . Smoking status: Former Smoker    Quit date: 01/04/1992  . Smokeless tobacco: Never Used  . Alcohol use No  . Drug use: No  . Sexual activity: Not Asked   Other Topics Concern  . None   Social History Narrative  . None    Family History  Problem Relation Age  of Onset  . COPD Mother   . Cancer Mother   . Other Mother     blood cloth        Review of Systems  Constitutional: Negative for fever, malaise/fatigue and weight loss.  HENT: Negative for congestion and sore throat.   Eyes:       Negative for visual changes  Respiratory: Negative for cough and shortness of breath.   Cardiovascular: Negative for chest pain, palpitations and leg swelling.  Gastrointestinal: Positive for abdominal pain, constipation and heartburn. Negative for blood in stool, diarrhea and nausea.  Genitourinary: Negative for dysuria, frequency and urgency.  Musculoskeletal: Negative for falls, joint pain and myalgias.  Skin: Negative for rash.  Neurological: Negative for dizziness, sensory change and headaches.  Endo/Heme/Allergies: Does not bruise/bleed easily.  Psychiatric/Behavioral: Negative for depression, substance abuse and suicidal ideas. The patient is not nervous/anxious.     Objective:   Vitals:   12/08/15 1513  BP: 114/88  Pulse: 61  Temp: 98.2 F (36.8 C)    Body mass index is 20.8 kg/m.   Physical Examination:  Physical Exam  Constitutional: She is oriented to person, place, and time. No distress.  Eyes: No scleral icterus.  Neck: Normal range of motion. Neck supple.  Cardiovascular: Normal rate and normal heart sounds.   Pulmonary/Chest: Effort normal and breath sounds normal.  Abdominal: Soft. She exhibits distension. She exhibits no mass. There is tenderness. There is no rebound and no guarding.  Musculoskeletal: She exhibits no edema.  Lymphadenopathy:    She has no cervical adenopathy.  Neurological: She is alert and oriented to person, place, and time. Gait normal.  Skin: Skin is warm and dry.  Vitals reviewed.   ASSESSMENT and PLAN:  Angela Villanueva was seen today for establish care.  Diagnoses and all orders for this visit:  Abdominal pain, epigastric -     Discontinue: metoCLOPramide (REGLAN) 5 MG/5ML solution; Take 10 mLs  (10 mg total) by mouth 4 (four) times daily -  before meals and at bedtime. -     Ambulatory referral to Gastroenterology -     Discontinue: simethicone (MYLICON) 0000000 MG chewable tablet; Chew 1 tablet (125 mg total) by mouth every 6 (six) hours as needed for flatulence. -     simethicone (MYLICON) 0000000 MG chewable tablet; Chew 1 tablet (125 mg total) by mouth every 6 (six) hours as needed for flatulence. -     metoCLOPramide (REGLAN) 5 MG/5ML solution; Take 10 mLs (10 mg total) by mouth 4 (four) times daily -  before meals and at bedtime.  Slow transit constipation -     Ambulatory referral to Gastroenterology  Gastroesophageal reflux disease, esophagitis presence not specified  Gastroparesis -     Discontinue: simethicone (MYLICON) 0000000 MG chewable tablet; Chew 1 tablet (125 mg total) by mouth every 6 (six) hours as needed for flatulence. -     simethicone (MYLICON) 0000000 MG chewable  tablet; Chew 1 tablet (125 mg total) by mouth every 6 (six) hours as needed for flatulence.  History of peptic ulcer disease  Dysphagia, unspecified type    No problem-specific Assessment & Plan notes found for this encounter.     Follow up: Return in about 1 month (around 01/08/2016) for CPE.  Wilfred Lacy, NP

## 2015-12-08 NOTE — Progress Notes (Signed)
Pre visit review using our clinic review tool, if applicable. No additional management support is needed unless otherwise documented below in the visit note. 

## 2015-12-14 ENCOUNTER — Telehealth: Payer: Self-pay | Admitting: Nurse Practitioner

## 2015-12-14 DIAGNOSIS — R1013 Epigastric pain: Secondary | ICD-10-CM

## 2015-12-14 DIAGNOSIS — K3184 Gastroparesis: Secondary | ICD-10-CM

## 2015-12-14 MED ORDER — METOCLOPRAMIDE HCL 5 MG/5ML PO SOLN: 10.0000 mg | Freq: Three times a day (TID) | ORAL | 0 refills | Status: DC

## 2015-12-14 NOTE — Telephone Encounter (Signed)
Patient called to advise that she would like further refills of metoCLOPramide (REGLAN) 5 MG/5ML solution [id number]  . She states that it is working great. [facility]

## 2015-12-15 ENCOUNTER — Telehealth: Payer: Self-pay | Admitting: Gastroenterology

## 2015-12-15 NOTE — Telephone Encounter (Addendum)
Please see my last office note.  Please make sure PCP receives copy of it if they did not already.  That is why I referred her to an academic institution.  I am afraid I cannot affect their decision whether or when to see her. I have nothing further to offer her, so I am not planning to see her in clinic.

## 2015-12-15 NOTE — Telephone Encounter (Signed)
Called patient, explained that Dr. Loletha Carrow does not feel that Angela Villanueva has anything more to offer her here and that is why Angela Villanueva wanted her to see an academic institution. I called [facility] and patient's referral was denied. I told patient that I would send a referral to [facility]. Copy of our note will be sent to patient's PCP.

## 2015-12-15 NOTE — Telephone Encounter (Signed)
Looking back at the notes, I see a referral placed to [facility]. I called them to see if patient was ever seen there, was told that her referral was declined-no reason was given.  Dr. Loletha Carrow patient saw her PCP and a referral was placed for her to come back here. Please advise.

## 2015-12-15 NOTE — Telephone Encounter (Signed)
This was already addressed by [facility].

## 2015-12-17 ENCOUNTER — Telehealth: Payer: Self-pay

## 2015-12-17 NOTE — Telephone Encounter (Signed)
Referral faxed to [facility].

## 2016-01-07 ENCOUNTER — Ambulatory Visit: Payer: Self-pay | Admitting: Nurse Practitioner

## 2016-10-18 ENCOUNTER — Emergency Department (HOSPITAL_COMMUNITY): Payer: Self-pay

## 2016-10-18 ENCOUNTER — Encounter (HOSPITAL_COMMUNITY): Payer: Self-pay | Admitting: Emergency Medicine

## 2016-10-18 ENCOUNTER — Emergency Department (HOSPITAL_COMMUNITY)
Admission: EM | Admit: 2016-10-18 | Discharge: 2016-10-18 | Disposition: A | Discharge status: Home / Self Care | Payer: Self-pay | Attending: Emergency Medicine | Admitting: Emergency Medicine

## 2016-10-18 ENCOUNTER — Encounter: Payer: Self-pay | Admitting: Nurse Practitioner

## 2016-10-18 ENCOUNTER — Other Ambulatory Visit: Payer: Self-pay | Admitting: Nurse Practitioner

## 2016-10-18 DIAGNOSIS — R1310 Dysphagia, unspecified: Secondary | ICD-10-CM

## 2016-10-18 DIAGNOSIS — Z79899 Other long term (current) drug therapy: Secondary | ICD-10-CM | POA: Insufficient documentation

## 2016-10-18 DIAGNOSIS — R1013 Epigastric pain: Secondary | ICD-10-CM

## 2016-10-18 DIAGNOSIS — R1031 Right lower quadrant pain: Secondary | ICD-10-CM | POA: Insufficient documentation

## 2016-10-18 DIAGNOSIS — K3184 Gastroparesis: Secondary | ICD-10-CM

## 2016-10-18 DIAGNOSIS — R1011 Right upper quadrant pain: Secondary | ICD-10-CM | POA: Insufficient documentation

## 2016-10-18 DIAGNOSIS — K219 Gastro-esophageal reflux disease without esophagitis: Secondary | ICD-10-CM

## 2016-10-18 DIAGNOSIS — Z880 Allergy status to penicillin: Secondary | ICD-10-CM | POA: Insufficient documentation

## 2016-10-18 DIAGNOSIS — Z87891 Personal history of nicotine dependence: Secondary | ICD-10-CM | POA: Insufficient documentation

## 2016-10-18 LAB — URINALYSIS, ROUTINE W REFLEX MICROSCOPIC
Bilirubin Urine: NEGATIVE
GLUCOSE, UA: NEGATIVE mg/dL
HGB URINE DIPSTICK: NEGATIVE
Ketones, ur: NEGATIVE mg/dL
Nitrite: NEGATIVE
Protein, ur: NEGATIVE mg/dL
Specific Gravity, Urine: 1.013 (ref 1.005–1.030)
pH: 8 (ref 5.0–8.0)

## 2016-10-18 LAB — CBC WITH DIFFERENTIAL/PLATELET
BASOS ABS: 0 10*3/uL (ref 0.0–0.1)
BASOS PCT: 0 %
EOS ABS: 0.1 10*3/uL (ref 0.0–0.7)
EOS PCT: 1 %
HCT: 37.2 % (ref 36.0–46.0)
Hemoglobin: 12.4 g/dL (ref 12.0–15.0)
Lymphocytes Relative: 64 %
Lymphs Abs: 4 10*3/uL (ref 0.7–4.0)
MCH: 29.3 pg (ref 26.0–34.0)
MCHC: 33.3 g/dL (ref 30.0–36.0)
MCV: 87.9 fL (ref 78.0–100.0)
MONO ABS: 0.5 10*3/uL (ref 0.1–1.0)
Monocytes Relative: 8 %
Neutro Abs: 1.7 10*3/uL (ref 1.7–7.7)
Neutrophils Relative %: 27 %
PLATELETS: 163 10*3/uL (ref 150–400)
RBC: 4.23 MIL/uL (ref 3.87–5.11)
RDW: 12.6 % (ref 11.5–15.5)
WBC: 6.3 10*3/uL (ref 4.0–10.5)

## 2016-10-18 LAB — COMPREHENSIVE METABOLIC PANEL
ALBUMIN: 3.2 g/dL — AB (ref 3.5–5.0)
ALT: 18 U/L (ref 14–54)
AST: 27 U/L (ref 15–41)
Alkaline Phosphatase: 60 U/L (ref 38–126)
Anion gap: 7 (ref 5–15)
BUN: 10 mg/dL (ref 6–20)
CHLORIDE: 108 mmol/L (ref 101–111)
CO2: 26 mmol/L (ref 22–32)
Calcium: 8.7 mg/dL — ABNORMAL LOW (ref 8.9–10.3)
Creatinine, Ser: 0.76 mg/dL (ref 0.44–1.00)
GFR calc Af Amer: >60 mL/min (ref >60)
Glucose, Bld: 92 mg/dL (ref 65–99)
POTASSIUM: 3.6 mmol/L (ref 3.5–5.1)
Sodium: 141 mmol/L (ref 135–145)
Total Bilirubin: 0.7 mg/dL (ref 0.3–1.2)
Total Protein: 6.2 g/dL — ABNORMAL LOW (ref 6.5–8.1)

## 2016-10-18 LAB — LIPASE, BLOOD: LIPASE: 32 U/L (ref 11–51)

## 2016-10-18 MED ORDER — MORPHINE SULFATE (PF) 4 MG/ML IV SOLN
4.0000 mg | Freq: Once | INTRAVENOUS | Status: AC | PRN
  Administered 2016-10-18: 4 mg via INTRAVENOUS
  Filled 2016-10-18: qty 1

## 2016-10-18 MED ORDER — IOPAMIDOL (ISOVUE-300) INJECTION 61%
INTRAVENOUS | Status: AC
  Administered 2016-10-18: 100 mL via INTRAVENOUS
  Filled 2016-10-18: qty 100

## 2016-10-18 MED ORDER — DICYCLOMINE HCL 20 MG PO TABS: 20.0000 mg | ORAL_TABLET | Freq: Two times a day (BID) | ORAL | 0 refills | Status: DC

## 2016-10-18 MED ORDER — ONDANSETRON 4 MG PO TBDP: 4.0000 mg | ORAL_TABLET | Freq: Three times a day (TID) | ORAL | 0 refills | Status: DC | PRN

## 2016-10-18 MED ORDER — SODIUM CHLORIDE 0.9 % IV BOLUS (SEPSIS)
1000.0000 mL | Freq: Once | INTRAVENOUS | Status: AC
  Administered 2016-10-18: 1000 mL via INTRAVENOUS

## 2016-10-18 MED ORDER — ONDANSETRON HCL 4 MG/2ML IJ SOLN
4.0000 mg | Freq: Once | INTRAMUSCULAR | Status: AC
  Administered 2016-10-18: 4 mg via INTRAVENOUS
  Filled 2016-10-18: qty 2

## 2016-10-18 MED ORDER — IOPAMIDOL (ISOVUE-300) INJECTION 61%
100.0000 mL | Freq: Once | INTRAVENOUS | Status: AC | PRN
  Administered 2016-10-18: 100 mL via INTRAVENOUS

## 2016-10-18 NOTE — ED Provider Notes (Signed)
[facility] DEPT Provider Note   CSN: [id number] Arrival date & time: 10/18/16  0524     History   Chief Complaint Chief Complaint  Patient presents with  . Abdominal Pain    HPI Angela Villanueva is a 61 y.o. female.  HPI   Angela Villanueva is a 61 y.o. female, with a history of gastric ulcer, GERD, and anemia, presenting to the ED with right-sided abdominal pain worsening over the last 2-3 weeks. States pain "feels like gas," intermittent, 8/10, radiates toward the lower back, right shoulder, and right lower quadrant and suprapubic region. Endorses nausea, vomiting with 3 episodes of emesis (nonbloody, nonbilious), and belching. Pain is worse with eating meats, somewhat better with eating greens and other vegetables. States she has been taking Gas-X, in addition to her prescribed home medications. Previously in the care of Dr. Deatra Ina with [facility] due to a peptic ulcer. Last bowel movement this morning and states it was normal for her, which is soft in consistency. Denies fever, vaginal bleeding, abnormal vaginal discharge, urinary complaints, hematochezia/melena, CP, trauma, SOB, or any other complaints.    Past Medical History:  Diagnosis Date  . Allergy   . Anemia   . Diverticulitis   . Gastric ulcer   . GERD (gastroesophageal reflux disease)     Patient Active Problem List   Diagnosis Date Noted  . Gastroparesis 09/01/2015  . Abdominal pain, epigastric 01/22/2014  . Esophageal reflux 05/02/2012  . Dysphagia 05/02/2012  . DUODENITIS, WITHOUT HEMORRHAGE 04/09/2007  . Constipation 04/09/2007  . History of peptic ulcer disease 04/09/2007    Past Surgical History:  Procedure Laterality Date  . ABDOMINAL HYSTERECTOMY    . EYE SURGERY    . fibroid tumor removal    . FOOT SURGERY    . LAPAROSCOPIC GASTROTOMY W/ REPAIR OF ULCER    . PARTIAL HYSTERECTOMY    . TONSILLECTOMY     AS CHILD    OB History    No data available        Home Medications    Prior to Admission medications   Medication Sig Start Date End Date Taking? Authorizing Provider  naproxen sodium (ANAPROX) 220 MG tablet Take 660 mg by mouth 2 (two) times daily with a meal.   Yes [provider]  dicyclomine (BENTYL) 20 MG tablet Take 1 tablet (20 mg total) by mouth 2 (two) times daily. 10/18/16   Jahon Bart C, PA-C  ondansetron (ZOFRAN ODT) 4 MG disintegrating tablet Take 1 tablet (4 mg total) by mouth every 8 (eight) hours as needed for nausea or vomiting. 10/18/16   Lorayne Bender, PA-C    Family History Family History  Problem Relation Age of Onset  . COPD Mother   . Cancer Mother   . Other Mother        blood cloth  . Diabetes Other     Social History Social History  Substance Use Topics  . Smoking status: Former Smoker    Quit date: 01/04/1992  . Smokeless tobacco: Never Used  . Alcohol use No     Allergies   Ampicillin and Penicillins   Review of Systems Review of Systems  Constitutional: Negative for chills, diaphoresis and fever.  Respiratory: Negative for shortness of breath.   Cardiovascular: Negative for chest pain.  Gastrointestinal: Positive for abdominal pain, nausea and vomiting. Negative for blood in stool, constipation and diarrhea.       Belching  Genitourinary: Negative for difficulty  urinating, dysuria, frequency, hematuria, vaginal bleeding and vaginal discharge.  All other systems reviewed and are negative.    Physical Exam Updated Vital Signs BP (!) 122/93 (BP Location: Left Arm)   Pulse 76   Temp 98.3 F (36.8 C) (Oral)   Resp 18   SpO2 100%   Physical Exam  Constitutional: She appears well-developed and well-nourished. No distress.  Patient noted to be belching every minute or so.  HENT:  Head: Normocephalic and atraumatic.  Eyes: Conjunctivae are normal.  Neck: Neck supple.  Cardiovascular: Normal rate, regular rhythm, normal heart sounds and intact distal pulses.    Pulmonary/Chest: Effort normal and breath sounds normal. No respiratory distress.  Abdominal: Soft. Bowel sounds are increased. There is tenderness in the right upper quadrant, right lower quadrant, epigastric area and suprapubic area. There is no guarding and no CVA tenderness.  Tenderness noted in the right upper and lower quadrants, right flank, epigastric region, and suprapubic region. Patient most tender in right lower quadrant and right flank.  Musculoskeletal: She exhibits no edema.  Lymphadenopathy:    She has no cervical adenopathy.  Neurological: She is alert.  Skin: Skin is warm and dry. She is not diaphoretic.  Psychiatric: She has a normal mood and affect. Her behavior is normal.  Nursing note and vitals reviewed.    ED Treatments / Results  Labs (all labs ordered are listed, but only abnormal results are displayed) Labs Reviewed  COMPREHENSIVE METABOLIC PANEL - Abnormal; Notable for the following:       Result Value   Calcium 8.7 (*)    Total Protein 6.2 (*)    Albumin 3.2 (*)    All other components within normal limits  URINALYSIS, ROUTINE W REFLEX MICROSCOPIC - Abnormal; Notable for the following:    APPearance CLOUDY (*)    Leukocytes, UA TRACE (*)    Bacteria, UA RARE (*)    Squamous Epithelial / LPF 0-5 (*)    All other components within normal limits  CBC WITH DIFFERENTIAL/PLATELET  LIPASE, BLOOD    EKG  EKG Interpretation None       Radiology Ct Abdomen Pelvis W Contrast  Result Date: 10/18/2016 CLINICAL DATA:  Abdominal pain and increased gas for the past few weeks. EXAM: CT ABDOMEN AND PELVIS WITH CONTRAST TECHNIQUE: Multidetector CT imaging of the abdomen and pelvis was performed using the standard protocol following bolus administration of intravenous contrast. CONTRAST:  100 ml ISOVUE-300 IOPAMIDOL (ISOVUE-300) INJECTION 61% COMPARISON:  CT abdomen and pelvis 08/11/2015. FINDINGS: Lower chest: Lung bases are clear. No pleural or pericardial  effusion. Hepatobiliary: No focal liver abnormality is seen. No gallstones, gallbladder wall thickening, or biliary dilatation. Pancreas: Unremarkable. No pancreatic ductal dilatation or surrounding inflammatory changes. Spleen: Normal in size without focal abnormality. Adrenals/Urinary Tract: Adrenal glands are unremarkable. Kidneys are normal, without renal calculi, focal lesion, or hydronephrosis. Bladder is unremarkable. Stomach/Bowel: Stomach is within normal limits. Appendix appears normal. No evidence of bowel wall thickening, distention, or inflammatory changes. Vascular/Lymphatic: Aortic atherosclerosis. No enlarged abdominal or pelvic lymph nodes. Reproductive: Status post hysterectomy. No adnexal masses. Other: There is a small volume of free pelvic fluid. Source of the fluid is not identified. Small fat containing umbilical hernia is seen. Musculoskeletal: Negative. IMPRESSION: Small volume of free pelvic fluid is nonspecific. Cause for the fluid is not identified. Question gastroenteritis. Aortic atherosclerosis. Small fat containing umbilical hernia. Electronically Signed   By: Inge Rise M.D.   On: 10/18/2016 10:27   US Abdomen  Limited Ruq  Result Date: 10/18/2016 CLINICAL DATA:  Right upper quadrant pain for several weeks EXAM: ULTRASOUND ABDOMEN LIMITED RIGHT UPPER QUADRANT COMPARISON:  08/11/2015 FINDINGS: Gallbladder: No gallstones or wall thickening visualized. No sonographic Murphy sign noted by sonographer. Common bile duct: Diameter: Wall 0.8 mm Liver: No focal lesion identified. Within normal limits in parenchymal echogenicity. Portal vein is patent on color Doppler imaging with normal direction of blood flow towards the liver. IMPRESSION: No acute abnormality noted. Electronically Signed   By: Inez Catalina M.D.   On: 10/18/2016 09:19    Procedures Procedures (including critical care time)  Medications Ordered in ED Medications  sodium chloride 0.9 % bolus 1,000 mL (0 mLs  Intravenous Stopped 10/18/16 0906)  morphine 4 MG/ML injection 4 mg (4 mg Intravenous Given 10/18/16 0756)  ondansetron (ZOFRAN) injection 4 mg (4 mg Intravenous Given 10/18/16 0755)  iopamidol (ISOVUE-300) 61 % injection 100 mL (100 mLs Intravenous Contrast Given 10/18/16 1001)  morphine 4 MG/ML injection 4 mg (4 mg Intravenous Given 10/18/16 1148)     Initial Impression / Assessment and Plan / ED Course  I have reviewed the triage vital signs and the nursing notes.  Pertinent labs & imaging results that were available during my care of the patient were reviewed by me and considered in my medical decision making (see chart for details).  Clinical Course as of Oct 19 1703  Tue Oct 18, 2016  0942 [place] and lab results discussed with patient. Pain and nausea are well controlled.   [SJ]    Clinical Course User Index [SJ] Taquanna Borras C, PA-C    Patient presents with abdominal pain. Patient is nontoxic appearing, afebrile, not tachycardic, not tachypneic, not hypotensive, maintains SPO2 of 100% on room air, and is in no apparent distress. No leukocytosis. Subsequent serial exams were benign. Patient had no episodes of emesis here in the ED. Belching resolved. Tolerated PO intake. No acute, significant abnormalities on imaging studies. PCP versus GI follow-up. The patient was given instructions for home care as well as return precautions. Patient voices understanding of these instructions, accepts the plan, and is comfortable with discharge.  Findings and plan of care discussed with Malvin Johns, MD.    Vitals:   10/18/16 0530 10/18/16 0807 10/18/16 1147  BP: (!) 122/93 139/87 132/79  Pulse: 76 62 64  Resp: 18 16 16   Temp: 98.3 F (36.8 C)    TempSrc: Oral    SpO2: 100% 100% 100%     Final Clinical Impressions(s) / ED Diagnoses   Final diagnoses:  RUQ pain  Right lower quadrant abdominal pain    New Prescriptions Discharge Medication List as of 10/18/2016 11:34 AM    START  taking these medications   Details  dicyclomine (BENTYL) 20 MG tablet Take 1 tablet (20 mg total) by mouth 2 (two) times daily., Starting Tue 10/18/2016, Print    ondansetron (ZOFRAN ODT) 4 MG disintegrating tablet Take 1 tablet (4 mg total) by mouth every 8 (eight) hours as needed for nausea or vomiting., Starting Tue 10/18/2016, Print         Tijah Hane, [place], PA-C 10/18/16 1705    Malvin Johns, MD 10/19/16 [phone]

## 2016-10-18 NOTE — Progress Notes (Signed)
She was evaluated in ED today. Has not used medication prescribed. She is requesting new referral to external GI group for second opinion.  This encounter was created in error - please disregard.

## 2016-10-18 NOTE — Discharge Instructions (Signed)
There were no acute, significant abnormalities on imaging studies. Laboratory results were reassuring. Please refer to the following for further management:  Hand washing: Wash your hands throughout the day, but especially before and after touching the face, using the restroom, sneezing, coughing, or touching surfaces that have been coughed or sneezed upon. Hydration: Symptoms will be intensified and complicated by dehydration. Dehydration can also extend the duration of symptoms. Drink plenty of fluids and get plenty of rest. You should be drinking at least half a liter of water an hour to stay hydrated. Electrolyte drinks are also encouraged. You should be drinking enough fluids to make your urine light yellow, almost clear. If this is not the case, you are not drinking enough water. Please note that some of the treatments indicated below will not be effective if you are not adequately hydrated. Pain or fever: Ibuprofen, Naproxen, or Tylenol for pain or fever.  Nausea/vomiting: Use the Zofran for nausea or vomiting.  Bentyl: May use the bentyl, as needed, for abdominal discomfort.  Diet: Please begin a clear liquid diet until symptoms begin to improve, but at least for the next 24 hours. May then progress to a full liquid diet, and then to solids. Follow up: Follow up with a primary care provider or return to the ED in 24 hours for a reexam of your abdomen. Return: Return to the ED sooner should symptoms worsen.

## 2016-10-18 NOTE — Progress Notes (Deleted)
Subjective:  Patient ID: Angela Villanueva, female    DOB: 12-17-1955  Age: 61 y.o. MRN: [id number]  CC: Hospitalization Follow-up (ER follow up--body pain--gas,bloating, going on for 3 wks. went to the ER today, rx zofran and bentyle)   HPI  Outpatient Medications Prior to Visit  Medication Sig Dispense Refill  . dicyclomine (BENTYL) 20 MG tablet Take 1 tablet (20 mg total) by mouth 2 (two) times daily. 20 tablet 0  . naproxen sodium (ANAPROX) 220 MG tablet Take 660 mg by mouth 2 (two) times daily with a meal.    . ondansetron (ZOFRAN ODT) 4 MG disintegrating tablet Take 1 tablet (4 mg total) by mouth every 8 (eight) hours as needed for nausea or vomiting. 20 tablet 0   No facility-administered medications prior to visit.     ROS See HPI  Objective:  BP (!) 144/82   Pulse 69   Temp 97.7 F (36.5 C)   Ht 5\' 5"  (1.651 m)   Wt 122 lb (55.3 kg)   SpO2 98%   BMI 20.30 kg/m   BP Readings from Last 3 Encounters:  10/18/16 (!) 144/82  10/18/16 132/79  12/08/15 114/88    Wt Readings from Last 3 Encounters:  10/18/16 122 lb (55.3 kg)  12/08/15 125 lb (56.7 kg)  09/08/15 118 lb (53.5 kg)    Physical Exam  Lab Results  Component Value Date   WBC 6.3 10/18/2016   HGB 12.4 10/18/2016   HCT 37.2 10/18/2016   PLT 163 10/18/2016   GLUCOSE 92 10/18/2016   ALT 18 10/18/2016   AST 27 10/18/2016   NA 141 10/18/2016   K 3.6 10/18/2016   CL 108 10/18/2016   CREATININE 0.76 10/18/2016   BUN 10 10/18/2016   CO2 26 10/18/2016    Ct Abdomen Pelvis W Contrast  Result Date: 10/18/2016 CLINICAL DATA:  Abdominal pain and increased gas for the past few weeks. EXAM: CT ABDOMEN AND PELVIS WITH CONTRAST TECHNIQUE: Multidetector CT imaging of the abdomen and pelvis was performed using the standard protocol following bolus administration of intravenous contrast. CONTRAST:  100 ml ISOVUE-300 IOPAMIDOL (ISOVUE-300) INJECTION 61% COMPARISON:  CT abdomen and pelvis 08/11/2015. FINDINGS:  Lower chest: Lung bases are clear. No pleural or pericardial effusion. Hepatobiliary: No focal liver abnormality is seen. No gallstones, gallbladder wall thickening, or biliary dilatation. Pancreas: Unremarkable. No pancreatic ductal dilatation or surrounding inflammatory changes. Spleen: Normal in size without focal abnormality. Adrenals/Urinary Tract: Adrenal glands are unremarkable. Kidneys are normal, without renal calculi, focal lesion, or hydronephrosis. Bladder is unremarkable. Stomach/Bowel: Stomach is within normal limits. Appendix appears normal. No evidence of bowel wall thickening, distention, or inflammatory changes. Vascular/Lymphatic: Aortic atherosclerosis. No enlarged abdominal or pelvic lymph nodes. Reproductive: Status post hysterectomy. No adnexal masses. Other: There is a small volume of free pelvic fluid. Source of the fluid is not identified. Small fat containing umbilical hernia is seen. Musculoskeletal: Negative. IMPRESSION: Small volume of free pelvic fluid is nonspecific. Cause for the fluid is not identified. Question gastroenteritis. Aortic atherosclerosis. Small fat containing umbilical hernia. Electronically Signed   By: Inge Rise M.D.   On: 10/18/2016 10:27   US Abdomen Limited Ruq  Result Date: 10/18/2016 CLINICAL DATA:  Right upper quadrant pain for several weeks EXAM: ULTRASOUND ABDOMEN LIMITED RIGHT UPPER QUADRANT COMPARISON:  08/11/2015 FINDINGS: Gallbladder: No gallstones or wall thickening visualized. No sonographic Murphy sign noted by sonographer. Common bile duct: Diameter: Wall 0.8 mm Liver: No focal lesion identified. Within normal  limits in parenchymal echogenicity. Portal vein is patent on color Doppler imaging with normal direction of blood flow towards the liver. IMPRESSION: No acute abnormality noted. Electronically Signed   By: Inez Catalina M.D.   On: 10/18/2016 09:19    Assessment & Plan:   There are no diagnoses linked to this encounter. I am  having Ms. Bacigalupo maintain her naproxen sodium, ondansetron, and dicyclomine.  No orders of the defined types were placed in this encounter.   Follow-up: No Follow-up on file.  Wilfred Lacy, NP

## 2016-10-18 NOTE — ED Notes (Signed)
US at bedside

## 2016-10-18 NOTE — ED Triage Notes (Signed)
Pt states for the past few weeks she has been having abd pain and a lot of gas  Pt states it is worse tonight  Pt has constant belching in triage  Denies vomiting  Pt states her right side is very sore  Pt states she has taken Gas-X without relief

## 2016-10-25 ENCOUNTER — Telehealth: Payer: Self-pay | Admitting: Nurse Practitioner

## 2016-10-25 MED ORDER — DICYCLOMINE HCL 20 MG PO TABS: 20.0000 mg | ORAL_TABLET | Freq: Two times a day (BID) | ORAL | 0 refills | Status: DC

## 2016-10-25 NOTE — Telephone Encounter (Signed)
Pt called requesting a refill on dicyclomine (BENTYL) 20 MG tablet sent to [facility] on [address]. She said that [place] told her to call us when she was almost out so that she could sent in a refill for her. Pt would like a call back when it has been sent.

## 2016-10-25 NOTE — Telephone Encounter (Signed)
rx for 10 days supply resend to [facility] as pt requested from Dr. Kathreen Cosier.   Pt stated she already saw Baldo Ash same day she came out from the hospital and [place] told her to call when she runs out of this med. Baldo Ash, do you want to send in some more? Please advise.

## 2016-10-25 NOTE — Telephone Encounter (Signed)
Please advise in [place] is out of the office.

## 2016-10-25 NOTE — Telephone Encounter (Signed)
A refill of Bentyl was sent in. Will need an appointment before any other refills will be sent.   Rosemarie Ax, MD [facility] 10/25/2016, 2:50 PM

## 2016-10-27 NOTE — Telephone Encounter (Signed)
No more refills. She needs to f/up with GI

## 2017-04-19 DIAGNOSIS — H524 Presbyopia: Secondary | ICD-10-CM | POA: Diagnosis not present

## 2017-04-26 ENCOUNTER — Encounter: Payer: Self-pay | Admitting: Gastroenterology

## 2017-04-26 ENCOUNTER — Telehealth: Payer: Self-pay | Admitting: Gastroenterology

## 2017-06-30 DIAGNOSIS — E2839 Other primary ovarian failure: Secondary | ICD-10-CM | POA: Diagnosis not present

## 2017-06-30 DIAGNOSIS — Z0000 Encounter for general adult medical examination without abnormal findings: Secondary | ICD-10-CM | POA: Diagnosis not present

## 2017-06-30 DIAGNOSIS — Z131 Encounter for screening for diabetes mellitus: Secondary | ICD-10-CM | POA: Diagnosis not present

## 2017-06-30 DIAGNOSIS — Z13220 Encounter for screening for lipoid disorders: Secondary | ICD-10-CM | POA: Diagnosis not present

## 2017-06-30 DIAGNOSIS — Z1239 Encounter for other screening for malignant neoplasm of breast: Secondary | ICD-10-CM | POA: Diagnosis not present

## 2017-06-30 DIAGNOSIS — Z1211 Encounter for screening for malignant neoplasm of colon: Secondary | ICD-10-CM | POA: Diagnosis not present

## 2017-07-12 ENCOUNTER — Other Ambulatory Visit: Payer: Self-pay | Admitting: Internal Medicine

## 2017-07-12 DIAGNOSIS — Z1231 Encounter for screening mammogram for malignant neoplasm of breast: Secondary | ICD-10-CM

## 2017-08-02 ENCOUNTER — Ambulatory Visit
Admission: RE | Admit: 2017-08-02 | Discharge: 2017-08-02 | Disposition: A | Discharge status: Home / Self Care | Payer: 59 | Source: Ambulatory Visit | Attending: Internal Medicine | Admitting: Internal Medicine

## 2017-08-02 ENCOUNTER — Other Ambulatory Visit: Payer: Self-pay | Admitting: Internal Medicine

## 2017-08-02 DIAGNOSIS — Z1231 Encounter for screening mammogram for malignant neoplasm of breast: Secondary | ICD-10-CM

## 2018-01-15 ENCOUNTER — Other Ambulatory Visit: Payer: Self-pay

## 2018-01-15 ENCOUNTER — Emergency Department (HOSPITAL_COMMUNITY)
Admission: EM | Admit: 2018-01-15 | Discharge: 2018-01-15 | Disposition: A | Discharge status: Home / Self Care | Payer: 59 | Attending: Emergency Medicine | Admitting: Emergency Medicine

## 2018-01-15 ENCOUNTER — Encounter (HOSPITAL_COMMUNITY): Payer: Self-pay | Admitting: Emergency Medicine

## 2018-01-15 DIAGNOSIS — B9789 Other viral agents as the cause of diseases classified elsewhere: Secondary | ICD-10-CM | POA: Diagnosis not present

## 2018-01-15 DIAGNOSIS — R05 Cough: Secondary | ICD-10-CM | POA: Diagnosis not present

## 2018-01-15 DIAGNOSIS — Z87891 Personal history of nicotine dependence: Secondary | ICD-10-CM | POA: Insufficient documentation

## 2018-01-15 DIAGNOSIS — J029 Acute pharyngitis, unspecified: Secondary | ICD-10-CM | POA: Diagnosis present

## 2018-01-15 DIAGNOSIS — J069 Acute upper respiratory infection, unspecified: Secondary | ICD-10-CM | POA: Diagnosis not present

## 2018-01-15 LAB — GROUP A STREP BY PCR: Group A Strep by PCR: NOT DETECTED

## 2018-01-15 NOTE — ED Provider Notes (Signed)
[facility] DEPT Provider Note   CSN: [id number] Arrival date & time: 01/15/18  8588     History   Chief Complaint Chief Complaint  Patient presents with  . Sore Throat  . Cough    HPI Angela Villanueva is a 63 y.o. female.  HPI Patient is a 46-year-old female with a history of gastroesophageal reflux disease and allergies who presents the emergency department with sore throat cough nasal congestion and sinus fullness which began yesterday.  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  She denies fevers and chills.  She reports her cough is productive.  She denies shortness of breath.  No history of asthma.  No wheezing.  No recent sick contacts that she knows of but she does work in the hospital setting.  Symptoms are mild in severity.  She is tried Mucinex and over-the-counter ibuprofen and Tylenol without improvement in her symptoms and is hoping that we can prescribe her something more to clear her symptoms before tomorrow as she has to return to work   Past Medical History:  Diagnosis Date  . Allergy   . Anemia   . Diverticulitis   . Gastric ulcer   . GERD (gastroesophageal reflux disease)     Patient Active Problem List   Diagnosis Date Noted  . Gastroparesis 09/01/2015  . Abdominal pain, epigastric 01/22/2014  . Esophageal reflux 05/02/2012  . Dysphagia 05/02/2012  . DUODENITIS, WITHOUT HEMORRHAGE 04/09/2007  . Constipation 04/09/2007  . History of peptic ulcer disease 04/09/2007    Past Surgical History:  Procedure Laterality Date  . ABDOMINAL HYSTERECTOMY    . EYE SURGERY    . fibroid tumor removal    . FOOT SURGERY    . LAPAROSCOPIC GASTROTOMY W/ REPAIR OF ULCER    . PARTIAL HYSTERECTOMY    . TONSILLECTOMY     AS CHILD     OB History   No obstetric history on file.      Home Medications    Prior to Admission medications   Medication Sig Start Date End Date Taking? Authorizing Provider  dicyclomine  (BENTYL) 20 MG tablet Take 1 tablet (20 mg total) by mouth 2 (two) times daily. 10/25/16   Rosemarie Ax, MD  naproxen sodium (ANAPROX) 220 MG tablet Take 660 mg by mouth 2 (two) times daily with a meal.    [provider]  ondansetron (ZOFRAN ODT) 4 MG disintegrating tablet Take 1 tablet (4 mg total) by mouth every 8 (eight) hours as needed for nausea or vomiting. 10/18/16   Lorayne Bender, PA-C    Family History Family History  Problem Relation Age of Onset  . COPD Mother   . Cancer Mother   . Other Mother        blood cloth  . Diabetes Other     Social History Social History   Tobacco Use  . Smoking status: Former Smoker    Last attempt to quit: 01/04/1992    Years since quitting: 26.0  . Smokeless tobacco: Never Used  Substance Use Topics  . Alcohol use: No  . Drug use: No     Allergies   Ampicillin and Penicillins   Review of Systems Review of Systems  All other systems reviewed and are negative.    Physical Exam Updated Vital Signs BP (!) 156/95 (BP Location: Right Arm)   Pulse 66   Temp 98 F (36.7 C) (Oral)   Resp 16   Ht 5'  5" (1.651 m)   Wt 51 kg   SpO2 96%   BMI 18.72 kg/m   Physical Exam Vitals signs and nursing note reviewed.  Constitutional:      General: She is not in acute distress.    Appearance: She is well-developed.  HENT:     Head: Normocephalic and atraumatic.     Mouth/Throat:     Mouth: Mucous membranes are moist. No oral lesions.     Pharynx: Uvula midline. No oropharyngeal exudate, posterior oropharyngeal erythema or uvula swelling.  Neck:     Musculoskeletal: Normal range of motion and neck supple.     Thyroid: No thyromegaly.  Cardiovascular:     Rate and Rhythm: Normal rate and regular rhythm.     Heart sounds: Normal heart sounds.  Pulmonary:     Effort: Pulmonary effort is normal. No respiratory distress.     Breath sounds: Normal breath sounds. No wheezing.  Abdominal:     General: There is no distension.      Palpations: Abdomen is soft.  Musculoskeletal: Normal range of motion.  Lymphadenopathy:     Cervical: No cervical adenopathy.  Skin:    General: Skin is warm and dry.  Neurological:     Mental Status: She is alert and oriented to person, place, and time.  Psychiatric:        Judgment: Judgment normal.      ED Treatments / Results  Labs (all labs ordered are listed, but only abnormal results are displayed) Labs Reviewed  GROUP A STREP BY PCR    EKG None  Radiology No results found.  Procedures Procedures (including critical care time)  Medications Ordered in ED Medications - No data to display   Initial Impression / Assessment and Plan / ED Course  I have reviewed the triage vital signs and the nursing notes.  Pertinent labs & imaging results that were available during my care of the patient were reviewed by me and considered in my medical decision making (see chart for details).     Viral upper respiratory symptoms.  Vital signs are stable.  Lungs are clear.  Suspect viral illness.  Recommend over-the-counter medications.   Final Clinical Impressions(s) / ED Diagnoses   Final diagnoses:  Viral URI with cough    ED Discharge Orders    None       Jola Schmidt, MD 01/15/18 [phone]

## 2018-01-15 NOTE — ED Triage Notes (Signed)
Pt reports sore throat and cough that started last night.

## 2018-01-29 DIAGNOSIS — I10 Essential (primary) hypertension: Secondary | ICD-10-CM | POA: Diagnosis not present

## 2018-02-10 DIAGNOSIS — H524 Presbyopia: Secondary | ICD-10-CM | POA: Diagnosis not present

## 2018-02-26 ENCOUNTER — Other Ambulatory Visit: Payer: Self-pay | Admitting: Family Medicine

## 2018-02-26 ENCOUNTER — Ambulatory Visit: Payer: Self-pay

## 2018-02-26 DIAGNOSIS — M79641 Pain in right hand: Secondary | ICD-10-CM

## 2018-03-09 ENCOUNTER — Encounter (HOSPITAL_COMMUNITY): Payer: Self-pay | Admitting: Emergency Medicine

## 2018-03-09 ENCOUNTER — Emergency Department (HOSPITAL_COMMUNITY)
Admission: EM | Admit: 2018-03-09 | Discharge: 2018-03-09 | Disposition: A | Discharge status: Home / Self Care | Payer: 59 | Attending: Emergency Medicine | Admitting: Emergency Medicine

## 2018-03-09 DIAGNOSIS — R110 Nausea: Secondary | ICD-10-CM | POA: Diagnosis not present

## 2018-03-09 DIAGNOSIS — Z87891 Personal history of nicotine dependence: Secondary | ICD-10-CM | POA: Diagnosis not present

## 2018-03-09 DIAGNOSIS — Z79899 Other long term (current) drug therapy: Secondary | ICD-10-CM | POA: Insufficient documentation

## 2018-03-09 DIAGNOSIS — R55 Syncope and collapse: Secondary | ICD-10-CM | POA: Insufficient documentation

## 2018-03-09 DIAGNOSIS — R42 Dizziness and giddiness: Secondary | ICD-10-CM | POA: Diagnosis not present

## 2018-03-09 LAB — BASIC METABOLIC PANEL
Anion gap: 3 — ABNORMAL LOW (ref 5–15)
BUN: 17 mg/dL (ref 6–20)
CO2: 27 mmol/L (ref 22–32)
Calcium: 8.9 mg/dL (ref 8.9–10.3)
Chloride: 109 mmol/L (ref 98–111)
Creatinine, Ser: 1.04 mg/dL — ABNORMAL HIGH (ref 0.44–1.00)
GFR calc Af Amer: >60 mL/min (ref >60)
GFR calc non Af Amer: 58 mL/min — ABNORMAL LOW (ref >60)
Glucose, Bld: 109 mg/dL — ABNORMAL HIGH (ref 70–99)
Potassium: 5.3 mmol/L — ABNORMAL HIGH (ref 3.5–5.1)
Sodium: 139 mmol/L (ref 135–145)

## 2018-03-09 LAB — CBC
HCT: 41.7 % (ref 36.0–46.0)
Hemoglobin: 13.1 g/dL (ref 12.0–15.0)
MCH: 29.7 pg (ref 26.0–34.0)
MCHC: 31.4 g/dL (ref 30.0–36.0)
MCV: 94.6 fL (ref 80.0–100.0)
Platelets: 173 10*3/uL (ref 150–400)
RBC: 4.41 MIL/uL (ref 3.87–5.11)
RDW: 12.1 % (ref 11.5–15.5)
WBC: 7.4 10*3/uL (ref 4.0–10.5)
nRBC: 0 % (ref 0.0–0.2)

## 2018-03-09 LAB — CBG MONITORING, ED: Glucose-Capillary: 96 mg/dL (ref 70–99)

## 2018-03-09 MED ORDER — SODIUM CHLORIDE 0.9 % IV BOLUS
1000.0000 mL | Freq: Once | INTRAVENOUS | Status: AC
  Administered 2018-03-09: 1000 mL via INTRAVENOUS

## 2018-03-09 NOTE — ED Triage Notes (Signed)
Pt states that approx. 15 mins ago she had dizziness with a near syncopal episode and began having nausea but has not vomited yet. No weakness. No slurred speech. Alert and oriented at this time.

## 2018-03-09 NOTE — Discharge Instructions (Addendum)
You have been seen today for light headedness. Please read and follow all provided instructions. Return to the emergency room for worsening condition or new concerning symptoms.    1. Medications:  Continue usual home medications Take medications as prescribed. Please review all of the medicines and only take them if you do not have an allergy to them.  2. Treatment: rest, drink plenty of fluids 3. Follow Up: Please follow-up with your primary care provider at your appointment you already have scheduled next week.   ?

## 2018-03-09 NOTE — ED Provider Notes (Signed)
[facility] DEPT Provider Note   CSN: [id number] Arrival date & time: 03/09/18  1254    History   Chief Complaint Chief Complaint  Patient presents with  . Dizziness  . Emesis    HPI Angela Villanueva is a 63 y.o. female with history of GERD, anemia, gastric ulcer presenting to emergency department today with chief complaint of light-headedness.  Onset was acute, happening just prior to arrival.  Patient was visiting her brother in the ICU and states that the room was very warm.  She started to feel light-headed and like she was going to pass out.  She admits to associated nausea without emesis.  When patient left the room her symptoms resolved. Denies fever, chills, chest pain, shortness of breath, palpitations, abdominal pain, recent illness, visual changes, headache.  History provided by the patient.       Past Medical History:  Diagnosis Date  . Allergy   . Anemia   . Diverticulitis   . Gastric ulcer   . GERD (gastroesophageal reflux disease)     Patient Active Problem List   Diagnosis Date Noted  . Gastroparesis 09/01/2015  . Abdominal pain, epigastric 01/22/2014  . Esophageal reflux 05/02/2012  . Dysphagia 05/02/2012  . DUODENITIS, WITHOUT HEMORRHAGE 04/09/2007  . Constipation 04/09/2007  . History of peptic ulcer disease 04/09/2007    Past Surgical History:  Procedure Laterality Date  . ABDOMINAL HYSTERECTOMY    . EYE SURGERY    . fibroid tumor removal    . FOOT SURGERY    . LAPAROSCOPIC GASTROTOMY W/ REPAIR OF ULCER    . PARTIAL HYSTERECTOMY    . TONSILLECTOMY     AS CHILD     OB History   No obstetric history on file.      Home Medications    Prior to Admission medications   Medication Sig Start Date End Date Taking? Authorizing Provider  amLODipine (NORVASC) 5 MG tablet Take 5 mg by mouth daily. 02/21/18  Yes [provider]  Iron-Vitamins (GERITOL COMPLETE) TABS Take 1 tablet by mouth daily.   Yes  [provider]  omeprazole-sodium bicarbonate (ZEGERID) 40-1100 MG capsule Take 1 capsule by mouth 2 (two) times daily with breakfast and lunch.   Yes [provider]  Phenylephrine-Acetaminophen (VICKS DAYQUIL SINUS PO) Take 2 capsules by mouth daily as needed (for cold).   Yes [provider]    Family History Family History  Problem Relation Age of Onset  . COPD Mother   . Cancer Mother   . Other Mother        blood cloth  . Diabetes Other     Social History Social History   Tobacco Use  . Smoking status: Former Smoker    Last attempt to quit: 01/04/1992    Years since quitting: 26.1  . Smokeless tobacco: Never Used  Substance Use Topics  . Alcohol use: No  . Drug use: No     Allergies   Ampicillin and Penicillins   Review of Systems Review of Systems  Gastrointestinal: Positive for nausea.  Neurological: Positive for light-headedness. Negative for dizziness.  All other systems reviewed and are negative.    Physical Exam Updated Vital Signs BP 120/86   Pulse 66   Temp 98.7 F (37.1 C) (Oral)   Resp 12   Ht 5\' 5"  (1.651 m)   Wt 51.3 kg   SpO2 100%   BMI 18.80 kg/m   Physical Exam Vitals signs and nursing  note reviewed.  Constitutional:      Appearance: She is well-developed. She is not toxic-appearing or diaphoretic.  HENT:     Head: Normocephalic and atraumatic.     Right Ear: Tympanic membrane and external ear normal.     Left Ear: Tympanic membrane and external ear normal.     Nose: Nose normal.     Mouth/Throat:     Mouth: Mucous membranes are moist.     Pharynx: Oropharynx is clear. No oropharyngeal exudate or posterior oropharyngeal erythema.  Eyes:     General: No scleral icterus.       Right eye: No discharge.        Left eye: No discharge.     Extraocular Movements: Extraocular movements intact.     Right eye: No nystagmus.     Left eye: No nystagmus.     Conjunctiva/sclera: Conjunctivae normal.     Pupils:  Pupils are equal, round, and reactive to light.  Neck:     Musculoskeletal: Normal range of motion.  Cardiovascular:     Rate and Rhythm: Normal rate and regular rhythm.     Pulses: Normal pulses.     Heart sounds: Normal heart sounds.  Pulmonary:     Effort: Pulmonary effort is normal.     Breath sounds: Normal breath sounds.  Abdominal:     General: There is no distension.     Palpations: Abdomen is soft.  Musculoskeletal: Normal range of motion.  Skin:    General: Skin is warm and dry.     Capillary Refill: Capillary refill takes less than 2 seconds.  Neurological:     Mental Status: She is oriented to person, place, and time.     Comments: Speech is clear and goal oriented, follows commands CN III-XII intact, no facial droop Normal strength in upper and lower extremities bilaterally including dorsiflexion and plantar flexion, strong and equal grip strength Sensation normal to light and sharp touch Moves extremities without ataxia, coordination intact Normal finger to nose and rapid alternating movements Normal gait and balance   Psychiatric:        Behavior: Behavior normal.      ED Treatments / Results  Labs (all labs ordered are listed, but only abnormal results are displayed) Labs Reviewed  BASIC METABOLIC PANEL - Abnormal; Notable for the following components:      Result Value   Potassium 5.3 (*)    Glucose, Bld 109 (*)    Creatinine, Ser 1.04 (*)    GFR calc non Af Amer 58 (*)    Anion gap 3 (*)    All other components within normal limits  CBC  CBG MONITORING, ED    EKG EKG Interpretation  Date/Time:  Friday March 09 2018 12:58:20 EST Ventricular Rate:  64 PR Interval:    QRS Duration: 96 QT Interval:  421 QTC Calculation: 435 R Axis:   86 Text Interpretation:  Sinus rhythm Short PR interval Borderline right axis deviation Confirmed by Virgel Manifold [id number]) on 03/09/2018 2:52:39 PM   Radiology No results found.  Procedures Procedures  (including critical care time)  Medications Ordered in ED Medications  sodium chloride 0.9 % bolus 1,000 mL (1,000 mLs Intravenous Bolus 03/09/18 1356)     Initial Impression / Assessment and Plan / ED Course  I have reviewed the triage vital signs and the nursing notes.  Pertinent labs & imaging results that were available during my care of the patient were reviewed by me and considered  in my medical decision making (see chart for details).    Pt is afebrile, well appearing. She had an episode of feeling light-headed while in a room with the heat "up very high." Her symptoms resolved after leaving the Room. Will check CBC, BMP, EKG, orthostatic vitals, and give IV fluids. Patient has an appointment already scheduled with her PCP in 3 days for her annual physical.   EKG shows short PR interval, no Q waves seen. No elevated T waves seen. No ischemic changes. BMP shows hyperkalemia of 5.3 with slight hemolysis noted. Will not repeat at this time, because of already scheduled follow up with pcp in near future. CBC unremarkable. Orthostatic vitals are negative. Recommend repeat labs to monitor creatinine and potassium at her upcoming appointment.   Pt case discussed with Dr. Wilson Singer who agrees with my plan.  Patient is hemodynamically stable, in NAD, and able to ambulate in the ED. Evaluation does not show pathology that would require ongoing emergent intervention or inpatient treatment. I explained the diagnosis to the patient.  Patient is comfortable with above plan and is stable for discharge at this time. All questions were answered prior to disposition. Strict return precautions for returning to the ED were discussed.    This note was prepared with assistance of Systems analyst. Occasional wrong-word or sound-a-like substitutions may have occurred due to the inherent limitations of voice recognition software.        Final Clinical Impressions(s) / ED Diagnoses   Final  diagnoses:  Lightheadedness  Nausea    ED Discharge Orders    None       Flint Melter 03/09/18 1539    Virgel Manifold, MD 03/10/18 [phone]

## 2018-03-12 DIAGNOSIS — E2839 Other primary ovarian failure: Secondary | ICD-10-CM | POA: Diagnosis not present

## 2018-03-12 DIAGNOSIS — I10 Essential (primary) hypertension: Secondary | ICD-10-CM | POA: Diagnosis not present

## 2018-03-12 DIAGNOSIS — J302 Other seasonal allergic rhinitis: Secondary | ICD-10-CM | POA: Diagnosis not present

## 2018-03-16 ENCOUNTER — Other Ambulatory Visit: Payer: Self-pay | Admitting: Internal Medicine

## 2018-03-16 DIAGNOSIS — Z1231 Encounter for screening mammogram for malignant neoplasm of breast: Secondary | ICD-10-CM

## 2018-03-16 DIAGNOSIS — E2839 Other primary ovarian failure: Secondary | ICD-10-CM

## 2018-04-11 DIAGNOSIS — K121 Other forms of stomatitis: Secondary | ICD-10-CM | POA: Diagnosis not present

## 2018-06-13 DIAGNOSIS — E559 Vitamin D deficiency, unspecified: Secondary | ICD-10-CM | POA: Diagnosis not present

## 2018-06-13 DIAGNOSIS — R5383 Other fatigue: Secondary | ICD-10-CM | POA: Diagnosis not present

## 2018-06-13 DIAGNOSIS — Z0000 Encounter for general adult medical examination without abnormal findings: Secondary | ICD-10-CM | POA: Diagnosis not present

## 2018-06-13 DIAGNOSIS — Z114 Encounter for screening for human immunodeficiency virus [HIV]: Secondary | ICD-10-CM | POA: Diagnosis not present

## 2018-06-13 DIAGNOSIS — I456 Pre-excitation syndrome: Secondary | ICD-10-CM | POA: Diagnosis not present

## 2018-06-20 DIAGNOSIS — I456 Pre-excitation syndrome: Secondary | ICD-10-CM | POA: Diagnosis not present

## 2018-06-27 DIAGNOSIS — R740 Nonspecific elevation of levels of transaminase and lactic acid dehydrogenase [LDH]: Secondary | ICD-10-CM | POA: Diagnosis not present

## 2018-06-27 DIAGNOSIS — Z1159 Encounter for screening for other viral diseases: Secondary | ICD-10-CM | POA: Diagnosis not present

## 2018-06-27 DIAGNOSIS — H938X3 Other specified disorders of ear, bilateral: Secondary | ICD-10-CM | POA: Diagnosis not present

## 2018-06-27 DIAGNOSIS — Z8619 Personal history of other infectious and parasitic diseases: Secondary | ICD-10-CM | POA: Diagnosis not present

## 2018-07-03 DIAGNOSIS — K219 Gastro-esophageal reflux disease without esophagitis: Secondary | ICD-10-CM | POA: Diagnosis not present

## 2018-07-03 DIAGNOSIS — Z1159 Encounter for screening for other viral diseases: Secondary | ICD-10-CM | POA: Diagnosis not present

## 2018-07-03 DIAGNOSIS — Z800 Family history of malignant neoplasm of digestive organs: Secondary | ICD-10-CM | POA: Diagnosis not present

## 2018-07-03 DIAGNOSIS — Z1211 Encounter for screening for malignant neoplasm of colon: Secondary | ICD-10-CM | POA: Diagnosis not present

## 2018-07-19 MED FILL — AMLODIPINE BESYLATE 5 MG TA: 5 | 90 days supply | Qty: 90 | Fill #0

## 2018-07-25 DIAGNOSIS — B182 Chronic viral hepatitis C: Secondary | ICD-10-CM | POA: Diagnosis not present

## 2018-08-06 ENCOUNTER — Other Ambulatory Visit: Payer: Self-pay

## 2018-08-06 ENCOUNTER — Ambulatory Visit
Admission: RE | Admit: 2018-08-06 | Discharge: 2018-08-06 | Disposition: A | Discharge status: Home / Self Care | Payer: 59 | Source: Ambulatory Visit | Attending: Internal Medicine | Admitting: Internal Medicine

## 2018-08-06 DIAGNOSIS — Z780 Asymptomatic menopausal state: Secondary | ICD-10-CM | POA: Diagnosis not present

## 2018-08-06 DIAGNOSIS — Z1231 Encounter for screening mammogram for malignant neoplasm of breast: Secondary | ICD-10-CM | POA: Diagnosis not present

## 2018-08-06 DIAGNOSIS — M8589 Other specified disorders of bone density and structure, multiple sites: Secondary | ICD-10-CM | POA: Diagnosis not present

## 2018-08-06 DIAGNOSIS — E2839 Other primary ovarian failure: Secondary | ICD-10-CM

## 2018-08-06 DIAGNOSIS — M810 Age-related osteoporosis without current pathological fracture: Secondary | ICD-10-CM | POA: Diagnosis not present

## 2018-08-08 DIAGNOSIS — K219 Gastro-esophageal reflux disease without esophagitis: Secondary | ICD-10-CM | POA: Diagnosis not present

## 2018-08-08 DIAGNOSIS — Z1211 Encounter for screening for malignant neoplasm of colon: Secondary | ICD-10-CM | POA: Diagnosis not present

## 2018-08-08 DIAGNOSIS — Z01818 Encounter for other preprocedural examination: Secondary | ICD-10-CM | POA: Diagnosis not present

## 2018-08-08 MED FILL — OMEPRAZOLE 40 MG CPDR: 40 | 30 days supply | Qty: 30 | Fill #0

## 2018-08-16 DIAGNOSIS — K2270 Barrett's esophagus without dysplasia: Secondary | ICD-10-CM | POA: Diagnosis not present

## 2018-08-29 DIAGNOSIS — D689 Coagulation defect, unspecified: Secondary | ICD-10-CM | POA: Diagnosis not present

## 2018-08-29 DIAGNOSIS — R945 Abnormal results of liver function studies: Secondary | ICD-10-CM | POA: Diagnosis not present

## 2018-08-29 DIAGNOSIS — K648 Other hemorrhoids: Secondary | ICD-10-CM | POA: Diagnosis not present

## 2018-08-29 DIAGNOSIS — B182 Chronic viral hepatitis C: Secondary | ICD-10-CM | POA: Diagnosis not present

## 2018-08-29 MED FILL — FAMOTIDINE 20 MG TABLET: 20 | 30 days supply | Qty: 60 | Fill #0

## 2018-09-04 MED FILL — OMEPRAZOLE 40 MG CPDR: 40 | 30 days supply | Qty: 30 | Fill #1

## 2018-09-06 DIAGNOSIS — R109 Unspecified abdominal pain: Secondary | ICD-10-CM | POA: Diagnosis not present

## 2018-09-12 ENCOUNTER — Ambulatory Visit: Payer: 59 | Admitting: Pharmacist

## 2018-09-12 ENCOUNTER — Other Ambulatory Visit: Payer: Self-pay

## 2018-09-12 DIAGNOSIS — Z7189 Other specified counseling: Secondary | ICD-10-CM

## 2018-09-12 MED ORDER — MAVYRET 100-40 MG PO TABS: 3.0000 | ORAL_TABLET | Freq: Every day | ORAL | 1 refills | Status: DC

## 2018-09-12 NOTE — Progress Notes (Signed)
  S: Patient presents today for review of their specialty medication.   Patient is currently taking Mavyret for HCV. Patient is managed by Dr. Dallas Breeding for this. Pt last saw Dr. Dallas Breeding at [facility] last Thursday (09/06/18).  Dosing: Chronic hepatitis C (HCV monoinfected or HCV/HIV co-infected patients): Without cirrhosis or with compensated cirrhosis (Child-Pugh class A): Oral: Treatment-naive patients: Genotype 1, 2, 3, 4, 5, or 6: Three tablets once daily for 8 weeks.  Adherence: hasn't started   Efficacy: hasn't started   Monitoring:  Glucose control (only in diabetics): hasn't started  CBC: hasn't started  Hepatic function: hasn't started  Renal function: hasn't started  HCV viral load: per pt, monitored by Dr. Dallas Breeding at [facility] HBV positive/negative: per pt, checked by Dr. Dallas Breeding at [facility] and is negative     Current adverse effects: hasn't started   O:  Lab Results  Component Value Date   WBC 7.4 03/09/2018   HGB 13.1 03/09/2018   HCT 41.7 03/09/2018   MCV 94.6 03/09/2018   PLT 173 03/09/2018      Chemistry      Component Value Date/Time   NA 139 03/09/2018 1357   K 5.3 (H) 03/09/2018 1357   CL 109 03/09/2018 1357   CO2 27 03/09/2018 1357   BUN 17 03/09/2018 1357   CREATININE 1.04 (H) 03/09/2018 1357      Component Value Date/Time   CALCIUM 8.9 03/09/2018 1357   ALKPHOS 60 10/18/2016 0615   AST 27 10/18/2016 0615   ALT 18 10/18/2016 0615   BILITOT 0.7 10/18/2016 0615      A/P: 1. Medication review: patient is currently prescribed Mavyret for chronic hepatitis C infection. Reviewed the medication with the patient, including the following: Mavyret is a medication used in the treatment of chronic HCV. Administer with food. Possible adverse effects include headache, fatigue, GI upset. Hepatitis B virus (HBV) reactivation has been reported in hepatitis C virus (HCV)/HBV coinfected patients who were receiving or had completed treatment with  HCV direct-acting antivirals and were not receiving HBV antiviral therapy. No recommendations for any changes.   Benard Halsted, PharmD, [place] [phone]

## 2018-09-13 MED FILL — MAVYRET 100-40 MG TABS: 100-40 | 28 days supply | Qty: 84 | Fill #0

## 2018-09-18 DIAGNOSIS — R110 Nausea: Secondary | ICD-10-CM | POA: Diagnosis not present

## 2018-09-18 DIAGNOSIS — K219 Gastro-esophageal reflux disease without esophagitis: Secondary | ICD-10-CM | POA: Diagnosis not present

## 2018-09-18 DIAGNOSIS — R109 Unspecified abdominal pain: Secondary | ICD-10-CM | POA: Diagnosis not present

## 2018-09-18 DIAGNOSIS — K5900 Constipation, unspecified: Secondary | ICD-10-CM | POA: Diagnosis not present

## 2018-09-19 ENCOUNTER — Other Ambulatory Visit: Payer: Self-pay | Admitting: Internal Medicine

## 2018-09-19 DIAGNOSIS — R110 Nausea: Secondary | ICD-10-CM

## 2018-09-25 DIAGNOSIS — R1084 Generalized abdominal pain: Secondary | ICD-10-CM | POA: Diagnosis not present

## 2018-10-01 ENCOUNTER — Other Ambulatory Visit: Payer: Self-pay

## 2018-10-01 ENCOUNTER — Encounter (HOSPITAL_COMMUNITY)
Admission: RE | Admit: 2018-10-01 | Discharge: 2018-10-01 | Disposition: A | Discharge status: Home / Self Care | Payer: 59 | Source: Ambulatory Visit | Attending: Internal Medicine | Admitting: Internal Medicine

## 2018-10-01 DIAGNOSIS — R110 Nausea: Secondary | ICD-10-CM | POA: Diagnosis not present

## 2018-10-01 DIAGNOSIS — R109 Unspecified abdominal pain: Secondary | ICD-10-CM | POA: Diagnosis not present

## 2018-10-01 DIAGNOSIS — R112 Nausea with vomiting, unspecified: Secondary | ICD-10-CM | POA: Diagnosis not present

## 2018-10-01 MED ORDER — TECHNETIUM TC 99M MEBROFENIN IV KIT
5.4000 | PACK | Freq: Once | INTRAVENOUS | Status: AC | PRN
  Administered 2018-10-01: 5.4 via INTRAVENOUS

## 2018-10-03 MED FILL — OMEPRAZOLE 40 MG CPDR: 40 | 30 days supply | Qty: 30 | Fill #2

## 2018-10-04 DIAGNOSIS — R948 Abnormal results of function studies of other organs and systems: Secondary | ICD-10-CM | POA: Diagnosis not present

## 2018-10-04 DIAGNOSIS — K6389 Other specified diseases of intestine: Secondary | ICD-10-CM | POA: Diagnosis not present

## 2018-10-04 DIAGNOSIS — B182 Chronic viral hepatitis C: Secondary | ICD-10-CM | POA: Diagnosis not present

## 2018-10-08 MED FILL — MAVYRET 100-40 MG TABS: 100-40 | 28 days supply | Qty: 84 | Fill #1

## 2018-10-13 DIAGNOSIS — B182 Chronic viral hepatitis C: Secondary | ICD-10-CM | POA: Diagnosis not present

## 2018-10-13 DIAGNOSIS — Z1159 Encounter for screening for other viral diseases: Secondary | ICD-10-CM | POA: Diagnosis not present

## 2018-10-13 DIAGNOSIS — R945 Abnormal results of liver function studies: Secondary | ICD-10-CM | POA: Diagnosis not present

## 2018-10-13 DIAGNOSIS — Z7189 Other specified counseling: Secondary | ICD-10-CM | POA: Diagnosis not present

## 2018-10-13 DIAGNOSIS — K5900 Constipation, unspecified: Secondary | ICD-10-CM | POA: Diagnosis not present

## 2018-10-13 DIAGNOSIS — Z79899 Other long term (current) drug therapy: Secondary | ICD-10-CM | POA: Diagnosis not present

## 2018-10-15 MED FILL — FAMOTIDINE 20 MG TABS: 20 | 30 days supply | Qty: 60 | Fill #1

## 2018-10-22 ENCOUNTER — Other Ambulatory Visit: Payer: Self-pay | Admitting: Surgery

## 2018-10-22 DIAGNOSIS — R1084 Generalized abdominal pain: Secondary | ICD-10-CM | POA: Diagnosis not present

## 2018-10-22 DIAGNOSIS — G8929 Other chronic pain: Secondary | ICD-10-CM | POA: Diagnosis not present

## 2018-10-23 MED FILL — AMLODIPINE BESYLATE 5 MG TA: 5 | 60 days supply | Qty: 60 | Fill #1

## 2018-10-24 DIAGNOSIS — Z2821 Immunization not carried out because of patient refusal: Secondary | ICD-10-CM | POA: Diagnosis not present

## 2018-10-24 DIAGNOSIS — B182 Chronic viral hepatitis C: Secondary | ICD-10-CM | POA: Diagnosis not present

## 2018-10-24 DIAGNOSIS — K2940 Chronic atrophic gastritis without bleeding: Secondary | ICD-10-CM | POA: Diagnosis not present

## 2018-10-24 MED FILL — SUCRALFATE 1 GM TABLET: 1 | 7 days supply | Qty: 21 | Fill #0

## 2018-10-27 MED FILL — OMEPRAZOLE 40 MG CPDR: 40 | 60 days supply | Qty: 60 | Fill #0

## 2018-10-29 DIAGNOSIS — Z760 Encounter for issue of repeat prescription: Secondary | ICD-10-CM | POA: Diagnosis not present

## 2018-11-19 DIAGNOSIS — Z1159 Encounter for screening for other viral diseases: Secondary | ICD-10-CM | POA: Diagnosis not present

## 2018-11-19 DIAGNOSIS — R634 Abnormal weight loss: Secondary | ICD-10-CM | POA: Diagnosis not present

## 2018-11-19 DIAGNOSIS — K5900 Constipation, unspecified: Secondary | ICD-10-CM | POA: Diagnosis not present

## 2018-11-19 DIAGNOSIS — B182 Chronic viral hepatitis C: Secondary | ICD-10-CM | POA: Diagnosis not present

## 2018-11-20 MED FILL — POTASSIUM CL ER 10 MEQ TAB: 10 | 15 days supply | Qty: 30 | Fill #0

## 2018-11-27 DIAGNOSIS — R109 Unspecified abdominal pain: Secondary | ICD-10-CM | POA: Diagnosis not present

## 2018-11-27 DIAGNOSIS — Z79899 Other long term (current) drug therapy: Secondary | ICD-10-CM | POA: Diagnosis not present

## 2018-11-27 DIAGNOSIS — E876 Hypokalemia: Secondary | ICD-10-CM | POA: Diagnosis not present

## 2018-11-27 DIAGNOSIS — B182 Chronic viral hepatitis C: Secondary | ICD-10-CM | POA: Diagnosis not present

## 2018-12-03 MED FILL — FAMOTIDINE 20 MG TABS: 20 | 30 days supply | Qty: 60 | Fill #2

## 2018-12-07 DIAGNOSIS — K5909 Other constipation: Secondary | ICD-10-CM | POA: Diagnosis not present

## 2018-12-07 DIAGNOSIS — B182 Chronic viral hepatitis C: Secondary | ICD-10-CM | POA: Diagnosis not present

## 2018-12-07 DIAGNOSIS — K219 Gastro-esophageal reflux disease without esophagitis: Secondary | ICD-10-CM | POA: Diagnosis not present

## 2018-12-18 DIAGNOSIS — B182 Chronic viral hepatitis C: Secondary | ICD-10-CM | POA: Diagnosis not present

## 2018-12-18 DIAGNOSIS — K5900 Constipation, unspecified: Secondary | ICD-10-CM | POA: Diagnosis not present

## 2018-12-18 DIAGNOSIS — K219 Gastro-esophageal reflux disease without esophagitis: Secondary | ICD-10-CM | POA: Diagnosis not present

## 2018-12-18 DIAGNOSIS — I10 Essential (primary) hypertension: Secondary | ICD-10-CM | POA: Diagnosis not present

## 2018-12-18 MED FILL — metroNIDAZOLE 250 MG TABS: 250 | 30 days supply | Qty: 60 | Fill #0

## 2018-12-21 MED FILL — OMEPRAZOLE 40 MG CPDR: 40 | 60 days supply | Qty: 60 | Fill #1

## 2018-12-26 DIAGNOSIS — M79645 Pain in left finger(s): Secondary | ICD-10-CM | POA: Diagnosis not present

## 2018-12-26 DIAGNOSIS — S61221A Laceration with foreign body of left index finger without damage to nail, initial encounter: Secondary | ICD-10-CM | POA: Diagnosis not present

## 2018-12-26 MED FILL — LIDOCAINE 5 % CRM: 5 | 15 days supply | Qty: 30 | Fill #0

## 2019-01-28 MED FILL — SUCRALFATE 1 GM TABLET: 1 | 10 days supply | Qty: 30 | Fill #0

## 2019-02-14 MED FILL — AMLODIPINE BESYLATE 5 MG TA: 5 | 90 days supply | Qty: 90 | Fill #0

## 2019-02-27 MED FILL — LINZESS 145 MCG CAPSULE: 145 | 30 days supply | Qty: 30 | Fill #0

## 2019-03-02 MED FILL — FAMOTIDINE 20 MG TABLET: 20 | 30 days supply | Qty: 60 | Fill #3

## 2019-03-02 MED FILL — OMEPRAZOLE 40 MG CPDR: 40 | 60 days supply | Qty: 60 | Fill #2

## 2019-03-04 MED FILL — SUCRALFATE 1 GM TABLET: 1 | 10 days supply | Qty: 30 | Fill #1

## 2019-05-17 ENCOUNTER — Other Ambulatory Visit (HOSPITAL_COMMUNITY): Payer: Self-pay | Admitting: Family Medicine

## 2019-05-20 MED FILL — PREDNISOLONE AC 1% EYE DROP: 1 | 50 days supply | Qty: 10 | Fill #0

## 2019-05-20 MED FILL — PROLENSA 0.07% EYE DROPS: 0.07 | 60 days supply | Qty: 3 | Fill #0

## 2019-07-05 MED FILL — OMEPRAZOLE 40 MG CPDR: 40 | 60 days supply | Qty: 60 | Fill #4

## 2019-07-05 MED FILL — SUCRALFATE 1 GM TABLET: 1 | 10 days supply | Qty: 30 | Fill #3

## 2019-07-05 MED FILL — FAMOTIDINE 20 MG TABLET: 20 | 30 days supply | Qty: 60 | Fill #1

## 2019-07-05 MED FILL — PROLENSA 0.07% EYE DROPS: 0.07 | 30 days supply | Qty: 3 | Fill #1

## 2019-07-07 ENCOUNTER — Emergency Department (HOSPITAL_COMMUNITY): Payer: No Typology Code available for payment source

## 2019-07-07 ENCOUNTER — Other Ambulatory Visit: Payer: Self-pay

## 2019-07-07 ENCOUNTER — Emergency Department (HOSPITAL_COMMUNITY)
Admission: EM | Admit: 2019-07-07 | Discharge: 2019-07-07 | Disposition: A | Discharge status: Home / Self Care | Payer: No Typology Code available for payment source | Attending: Emergency Medicine | Admitting: Emergency Medicine

## 2019-07-07 DIAGNOSIS — X501XXA Overexertion from prolonged static or awkward postures, initial encounter: Secondary | ICD-10-CM | POA: Insufficient documentation

## 2019-07-07 DIAGNOSIS — S99911A Unspecified injury of right ankle, initial encounter: Secondary | ICD-10-CM | POA: Diagnosis not present

## 2019-07-07 DIAGNOSIS — Z87891 Personal history of nicotine dependence: Secondary | ICD-10-CM | POA: Diagnosis not present

## 2019-07-07 DIAGNOSIS — Y9301 Activity, walking, marching and hiking: Secondary | ICD-10-CM | POA: Insufficient documentation

## 2019-07-07 DIAGNOSIS — Y9289 Other specified places as the place of occurrence of the external cause: Secondary | ICD-10-CM | POA: Diagnosis not present

## 2019-07-07 DIAGNOSIS — Y998 Other external cause status: Secondary | ICD-10-CM | POA: Insufficient documentation

## 2019-07-07 DIAGNOSIS — Z79899 Other long term (current) drug therapy: Secondary | ICD-10-CM | POA: Insufficient documentation

## 2019-07-07 MED ORDER — ACETAMINOPHEN 325 MG PO TABS
650.0000 mg | ORAL_TABLET | Freq: Once | ORAL | Status: AC
  Administered 2019-07-07: 650 mg via ORAL
  Filled 2019-07-07: qty 2

## 2019-07-07 NOTE — ED Provider Notes (Signed)
[facility] DEPT Provider Note   CSN: [id number] Arrival date & time: 07/07/19  0701     History Chief Complaint  Patient presents with  . Foot Injury    Angela Villanueva is a 64 y.o. female.  HPI 64 year old female with a history of anemia, GERD, diverticulitis, gastric ulcer presents to the ER with pain to her right ankle.  Patient states that yesterday she was walking with her grandson, when he tried to step into some boot.  She bent over to try to grab him, and states that she missed stepped and felt like her ankle twisted in a strange way.  She was able to ambulate after, and did not have any immediate pain.  However this morning, she woke up with 10/10 pain in the right ankle.  She noticed some swelling to the lateral outside of the ankle.  She denies any numbness or tingling, fevers or chills.  She has still been able to ambulate today however with pain.  She has tried to take some Aleve yesterday.  No history of ankle injuries or surgeries.    Past Medical History:  Diagnosis Date  . Allergy   . Anemia   . Diverticulitis   . Gastric ulcer   . GERD (gastroesophageal reflux disease)     Patient Active Problem List   Diagnosis Date Noted  . Gastroparesis 09/01/2015  . Abdominal pain, epigastric 01/22/2014  . Esophageal reflux 05/02/2012  . Dysphagia 05/02/2012  . DUODENITIS, WITHOUT HEMORRHAGE 04/09/2007  . Constipation 04/09/2007  . History of peptic ulcer disease 04/09/2007    Past Surgical History:  Procedure Laterality Date  . ABDOMINAL HYSTERECTOMY    . EYE SURGERY    . fibroid tumor removal    . FOOT SURGERY    . LAPAROSCOPIC GASTROTOMY W/ REPAIR OF ULCER    . PARTIAL HYSTERECTOMY    . TONSILLECTOMY     AS CHILD     OB History   No obstetric history on file.     Family History  Problem Relation Age of Onset  . COPD Mother   . Cancer Mother   . Other Mother        blood cloth  . Diabetes Other     Social  History   Tobacco Use  . Smoking status: Former Smoker    Quit date: 01/04/1992    Years since quitting: 27.5  . Smokeless tobacco: Never Used  Substance Use Topics  . Alcohol use: No  . Drug use: No    Home Medications Prior to Admission medications   Medication Sig Start Date End Date Taking? Authorizing Provider  amLODipine (NORVASC) 5 MG tablet Take 5 mg by mouth daily. 02/21/18   [provider]  Glecaprevir-Pibrentasvir (MAVYRET) 100-40 MG TABS Take 3 tablets by mouth daily after breakfast. Take with food. 09/12/18   Tresa Garter, MD  Iron-Vitamins (GERITOL COMPLETE) TABS Take 1 tablet by mouth daily.    [provider]  omeprazole-sodium bicarbonate (ZEGERID) 40-1100 MG capsule Take 1 capsule by mouth 2 (two) times daily with breakfast and lunch.    [provider]  Phenylephrine-Acetaminophen (VICKS DAYQUIL SINUS PO) Take 2 capsules by mouth daily as needed (for cold).    [provider]    Allergies    Ampicillin and Penicillins  Review of Systems   Review of Systems  Constitutional: Negative for chills and fever.  Musculoskeletal: Positive for arthralgias (Right ankle/foot pain).  Skin: Negative for color  change and wound.  Neurological: Negative for weakness and numbness.    Physical Exam Updated Vital Signs BP 117/76 (BP Location: Left Arm)   Pulse 72   Temp 98.1 F (36.7 C) (Oral)   Resp 16   Ht 5\' 4"  (1.626 m)   Wt 48.5 kg   SpO2 100%   BMI 18.37 kg/m   Physical Exam Vitals and nursing note reviewed.  Constitutional:      General: She is not in acute distress.    Appearance: Normal appearance. She is well-developed. She is not ill-appearing, toxic-appearing or diaphoretic.  HENT:     Head: Normocephalic and atraumatic.     Mouth/Throat:     Mouth: Mucous membranes are moist.     Pharynx: Oropharynx is clear.  Eyes:     Conjunctiva/sclera: Conjunctivae normal.  Cardiovascular:     Rate and Rhythm: Normal  rate and regular rhythm.     Pulses: Normal pulses.     Heart sounds: Normal heart sounds. No murmur heard.   Pulmonary:     Effort: Pulmonary effort is normal. No respiratory distress.     Breath sounds: Normal breath sounds.  Abdominal:     General: Abdomen is flat.     Palpations: Abdomen is soft.     Tenderness: There is no abdominal tenderness.  Musculoskeletal:        General: Swelling and tenderness present. No deformity.     Cervical back: Neck supple.     Right lower leg: No edema.     Left lower leg: No edema.     Comments: Right ankle with some soft tissue swelling on the outside.  Range of motion intact, 5/5 strength.  2+ DP pulses.  No evidence of bruising, erythema, fluctuance, swelling  Skin:    General: Skin is warm and dry.     Capillary Refill: Capillary refill takes less than 2 seconds.  Neurological:     General: No focal deficit present.     Mental Status: She is alert and oriented to person, place, and time.     Sensory: No sensory deficit.     Motor: No weakness.     ED Results / Procedures / Treatments   Labs (all labs ordered are listed, but only abnormal results are displayed) Labs Reviewed - No data to display  EKG None  Radiology DG Ankle Complete Right  Result Date: 07/07/2019 CLINICAL DATA:  Injury of the right ankle with pain. EXAM: RIGHT ANKLE - COMPLETE 3+ VIEW COMPARISON:  None. FINDINGS: There is no evidence of fracture, dislocation, or joint effusion. There is no evidence of arthropathy or other focal bone abnormality. Soft tissues are unremarkable. IMPRESSION: Negative. Electronically Signed   By: Abelardo Diesel M.D.   On: 07/07/2019 07:45   DG Foot Complete Right  Result Date: 07/07/2019 CLINICAL DATA:  Injury of the right ankle with pain. EXAM: RIGHT FOOT COMPLETE - 3+ VIEW COMPARISON:  None. FINDINGS: There is no evidence of fracture or dislocation. Chronic deformity of the first metatarsal is identified. Soft tissues are unremarkable.  IMPRESSION: No acute fracture or dislocation. Electronically Signed   By: Abelardo Diesel M.D.   On: 07/07/2019 07:46    Procedures Procedures (including critical care time)  Medications Ordered in ED Medications  acetaminophen (TYLENOL) tablet 650 mg (has no administration in time range)    ED Course  I have reviewed the triage vital signs and the nursing notes.  Pertinent labs & imaging results that were available  during my care of the patient were reviewed by me and considered in my medical decision making (see chart for details).    MDM Rules/Calculators/A&P                         64 year old female with right ankle pain Physical exam with some mild soft tissue edema to the right lateral ankle.  Full range of motion, 2+ DP pulses.  Sensations intact.  Plain films without evidence of fracture or dislocation.  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  Will provide cam walker boot and a note for work for several days.  Referral to orthopedics provided, encouraged her to followup with them.  Encouraged her to take Tylenol instead of Aleve since she has a history of gastric ulcers.  Return precautions given.  At this stage in the ED course, the patient has been medically screened and is stable for discharge.  Final Clinical Impression(s) / ED Diagnoses Final diagnoses:  Right ankle injury, initial encounter    Rx / DC Orders ED Discharge Orders    None       Lyndel Safe 07/07/19 0840    Lacretia Leigh, MD 07/07/19 1540

## 2019-07-07 NOTE — ED Triage Notes (Signed)
Per patient, she twisted right ankle yesterday at park. Patient c/o pain 10/10 in right ankle/foot. Took aleve yesterday for this.

## 2019-07-07 NOTE — Discharge Instructions (Signed)
Your x-ray did not show any evidence of dislocations or fractures.  Please wear the boot until you are able to follow-up with an orthopedic doctor.  Please call the phone number provided to schedule an appointment on Monday.  Please take Tylenol for pain.  Return to the ER if your symptoms worsen.

## 2019-08-07 MED FILL — RESTASIS 0.05% EYE EMULSION: 0.05 | 30 days supply | Qty: 60 | Fill #0

## 2019-08-12 MED FILL — AMLODIPINE BESYLATE 5 MG TA: 5 | 90 days supply | Qty: 90 | Fill #1

## 2019-08-12 MED FILL — FAMOTIDINE 20 MG TABLET: 20 | 30 days supply | Qty: 60 | Fill #2

## 2019-08-12 MED FILL — SUCRALFATE 1 GM TAB: 1 | 10 days supply | Qty: 30 | Fill #0

## 2019-08-23 MED FILL — OMEPRAZOLE 40 MG CPDR: 40 | 60 days supply | Qty: 60 | Fill #5

## 2019-09-18 ENCOUNTER — Other Ambulatory Visit (HOSPITAL_COMMUNITY): Payer: Self-pay | Admitting: Ophthalmology

## 2019-09-18 MED FILL — DOXYCYCLINE HYCLATE 100 MG: 100 | 30 days supply | Qty: 30 | Fill #0

## 2019-09-23 MED FILL — SUCRALFATE 1 GM TAB: 1 | 10 days supply | Qty: 30 | Fill #1

## 2019-09-23 MED FILL — FAMOTIDINE 20 MG TABLET: 20 | 30 days supply | Qty: 60 | Fill #3

## 2019-10-02 ENCOUNTER — Other Ambulatory Visit: Payer: Self-pay | Admitting: Internal Medicine

## 2019-10-07 ENCOUNTER — Other Ambulatory Visit (HOSPITAL_COMMUNITY): Payer: Self-pay | Admitting: Nurse Practitioner

## 2019-10-07 MED FILL — DICLOFENAC POT 50 MG TABLET: 50 | 30 days supply | Qty: 60 | Fill #0

## 2019-10-16 ENCOUNTER — Other Ambulatory Visit (HOSPITAL_COMMUNITY): Payer: Self-pay | Admitting: Nurse Practitioner

## 2019-10-16 MED FILL — RESTASIS 0.05% EYE EMULSION: 0.05 | 30 days supply | Qty: 60 | Fill #1

## 2019-10-16 MED FILL — ROSUVASTATIN CALCIUM 5 MG T: 5 | 90 days supply | Qty: 90 | Fill #0

## 2019-10-16 MED FILL — OMEPRAZOLE 40 MG CPDR: 40 | 90 days supply | Qty: 90 | Fill #0

## 2019-10-16 MED FILL — DOXYCYCLINE HYCLATE 100 MG: 100 | 30 days supply | Qty: 30 | Fill #1

## 2019-11-04 ENCOUNTER — Other Ambulatory Visit (HOSPITAL_COMMUNITY): Payer: Self-pay | Admitting: Nurse Practitioner

## 2019-11-04 MED FILL — DICLOFENAC SODIUM 1 % GEL: 1 | 30 days supply | Qty: 500 | Fill #0

## 2019-11-06 ENCOUNTER — Other Ambulatory Visit (HOSPITAL_COMMUNITY)
Admission: RE | Admit: 2019-11-06 | Discharge: 2019-11-06 | Disposition: A | Discharge status: Home / Self Care | Payer: No Typology Code available for payment source | Source: Ambulatory Visit | Attending: Nurse Practitioner | Admitting: Nurse Practitioner

## 2019-11-06 ENCOUNTER — Other Ambulatory Visit: Payer: Self-pay | Admitting: Nurse Practitioner

## 2019-11-06 DIAGNOSIS — N898 Other specified noninflammatory disorders of vagina: Secondary | ICD-10-CM | POA: Diagnosis not present

## 2019-11-11 LAB — MOLECULAR ANCILLARY ONLY
Bacterial Vaginitis (gardnerella): NEGATIVE
Candida Glabrata: NEGATIVE
Candida Vaginitis: NEGATIVE
Chlamydia: NEGATIVE
Comment: NEGATIVE
Comment: NEGATIVE
Comment: NEGATIVE
Comment: NEGATIVE
Comment: NEGATIVE
Comment: NORMAL
Neisseria Gonorrhea: NEGATIVE
Trichomonas: NEGATIVE

## 2019-11-27 MED FILL — RESTASIS 0.05% EYE EMULSION: 0.05 | 30 days supply | Qty: 60 | Fill #2

## 2019-12-02 ENCOUNTER — Other Ambulatory Visit (HOSPITAL_COMMUNITY): Payer: Self-pay | Admitting: Nurse Practitioner

## 2019-12-02 MED FILL — DICLOFENAC POT 50 MG TABLET: 50 | 30 days supply | Qty: 60 | Fill #0

## 2019-12-09 MED FILL — DOXYCYCLINE HYCLATE 100 MG: 100 | 30 days supply | Qty: 30 | Fill #2

## 2019-12-09 MED FILL — AMLODIPINE BESYLATE 5 MG TA: 5 | 90 days supply | Qty: 90 | Fill #0

## 2020-01-02 MED FILL — FAMOTIDINE 20 MG TABLET: 20 | 30 days supply | Qty: 60 | Fill #0

## 2020-01-07 MED FILL — OMEPRAZOLE 40 MG CPDR: 40 | 90 days supply | Qty: 90 | Fill #0

## 2020-01-12 IMAGING — MG DIGITAL SCREENING BILATERAL MAMMOGRAM WITH TOMO AND CAD
8 series · 9 of 24 positions shown · non-contrast
Comparison: Previous exam(s).

CLINICAL DATA: Screening.

EXAM:
DIGITAL SCREENING BILATERAL MAMMOGRAM WITH TOMO AND CAD

[R MLO synth-2D]
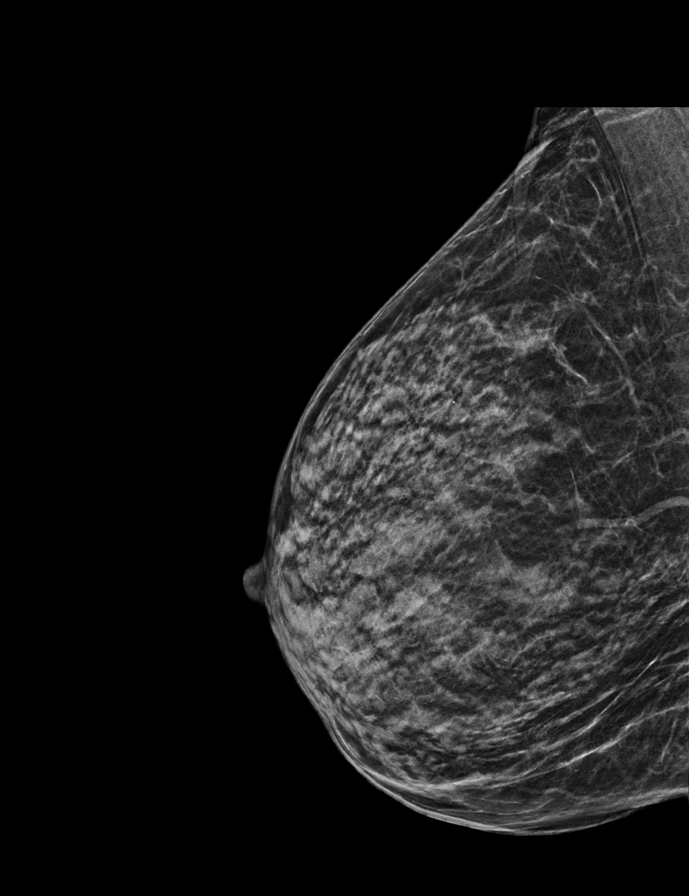

[R CC synth-2D]
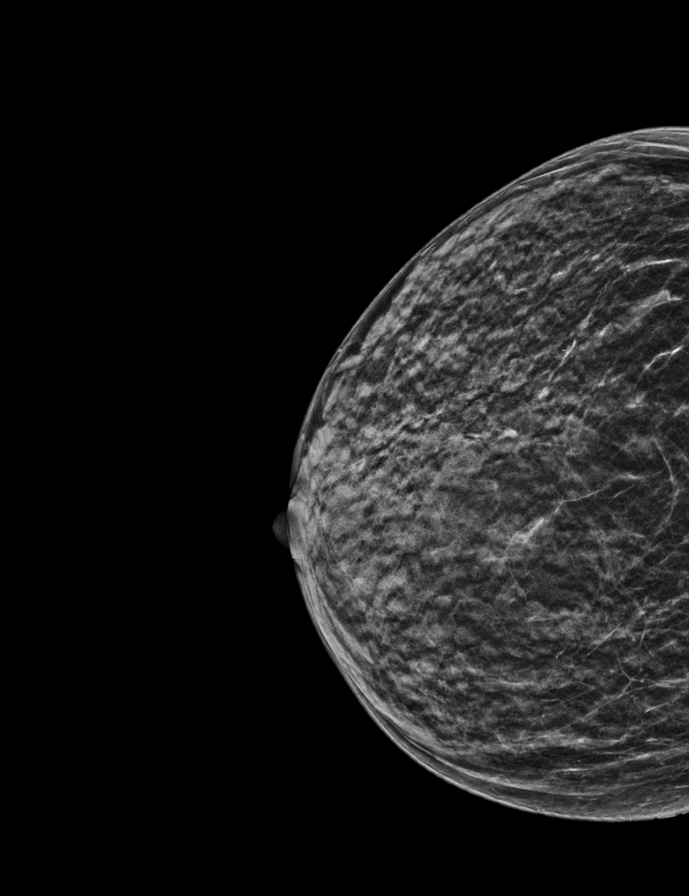

[L CC synth-2D]
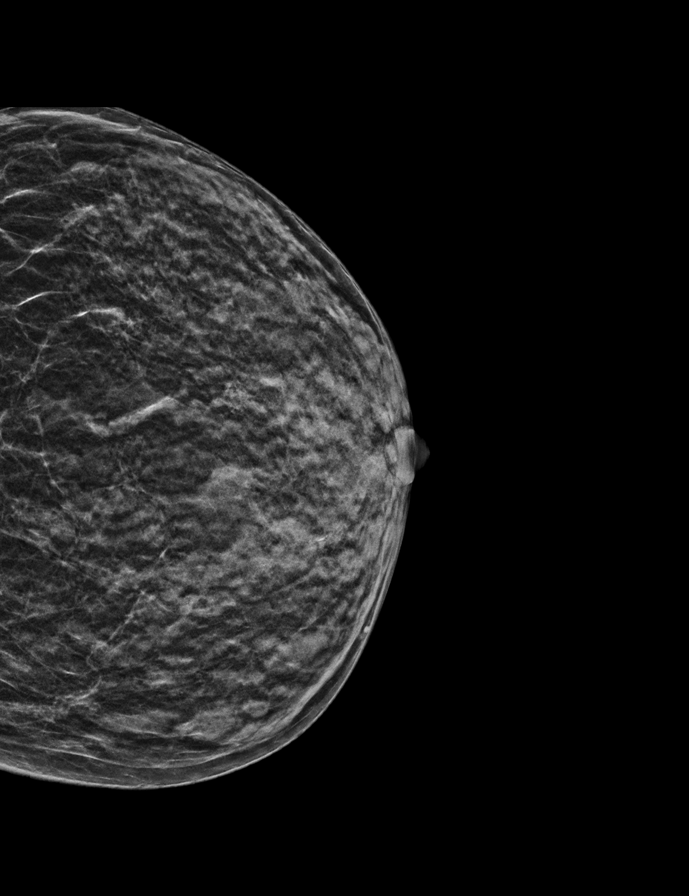

[L MLO synth-2D]
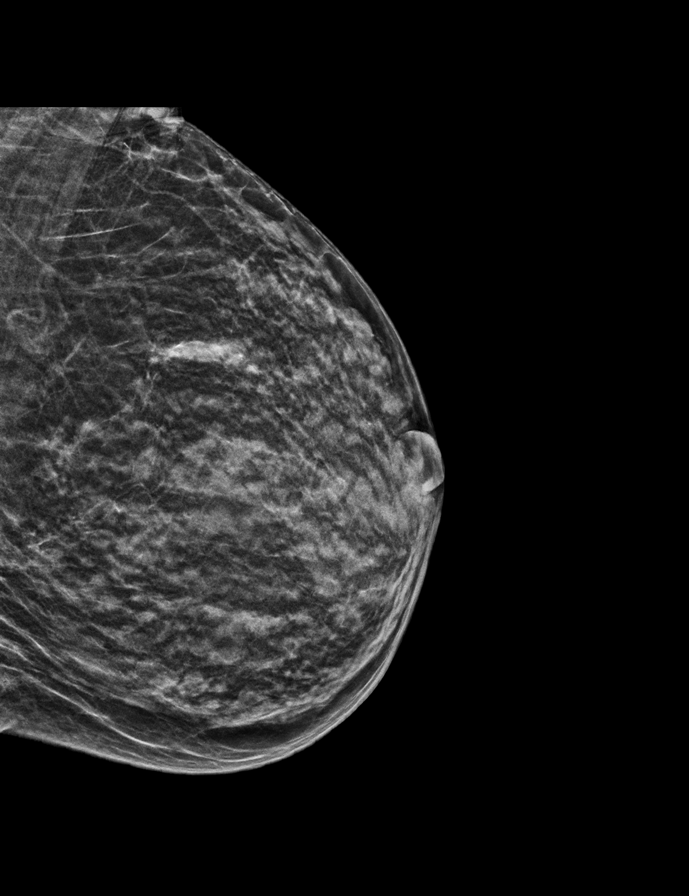

[R MLO tomo · 2 of 30 frames shown]
[frame 10/30]
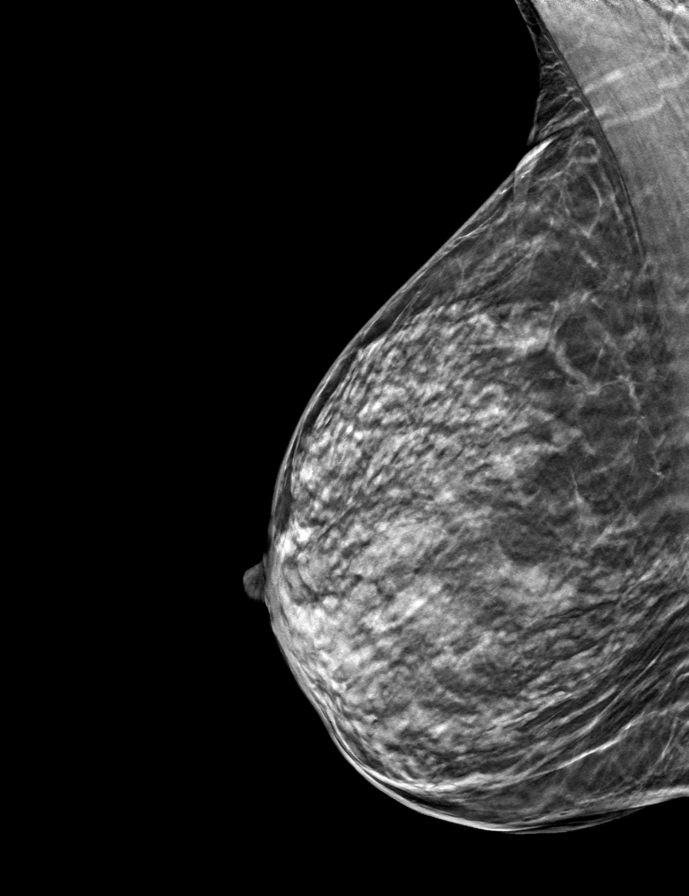
[frame 15/30]
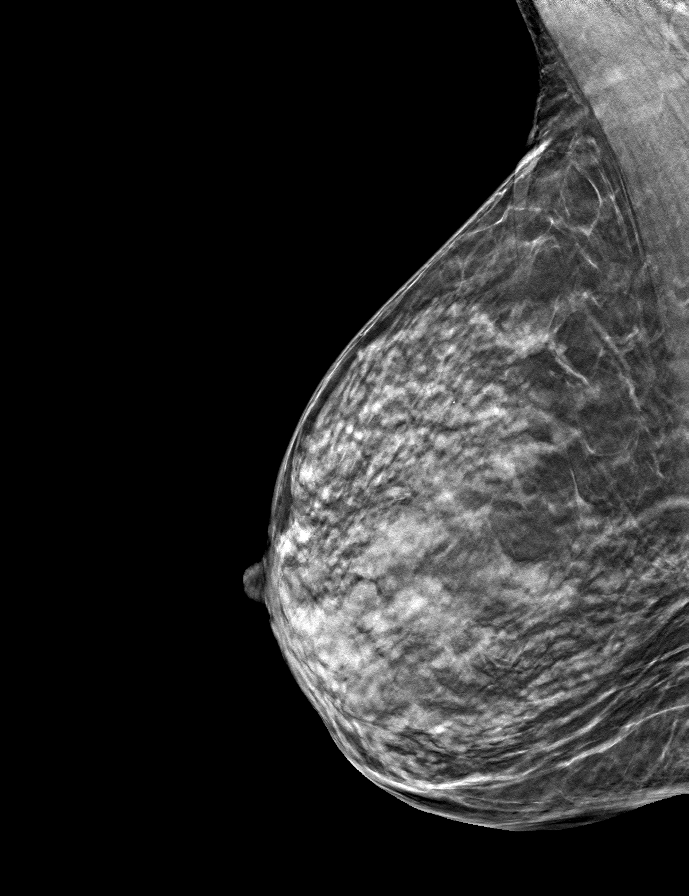

[L MLO tomo · tomo slice 16/31.0]
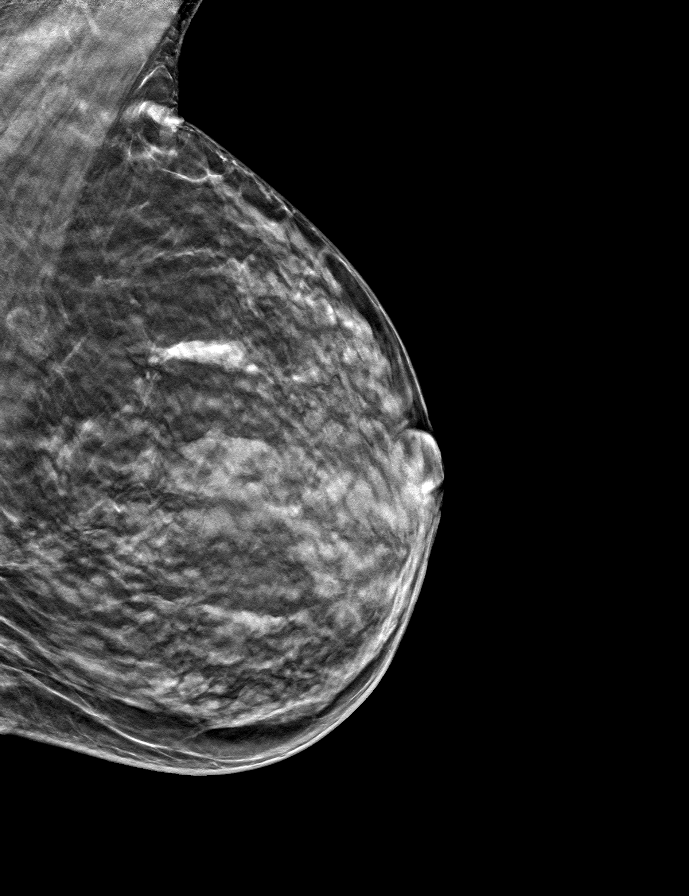

[L CC tomo · tomo slice 15/28.0]
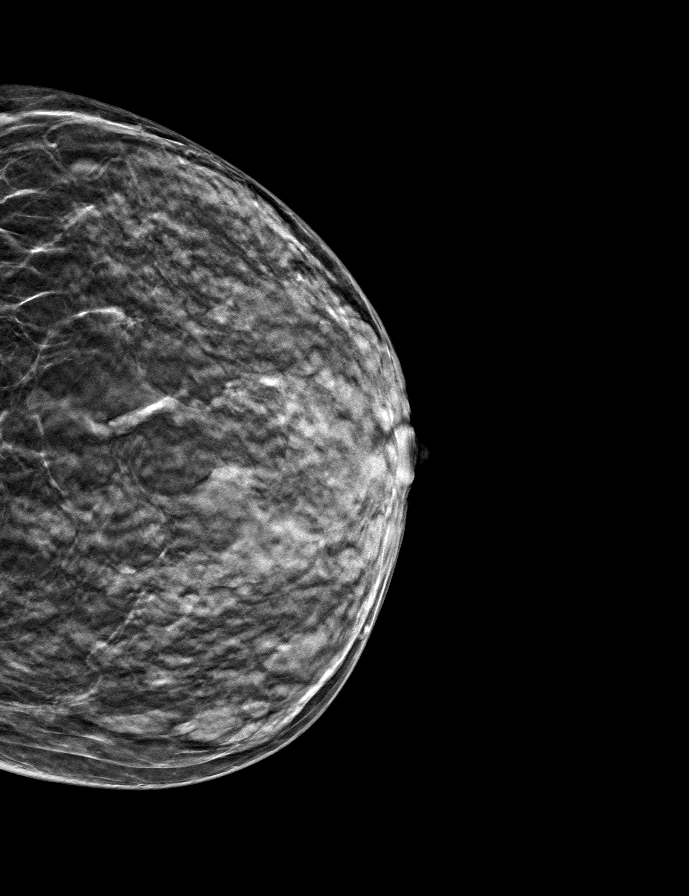

[R CC tomo · tomo slice 14/27.0]
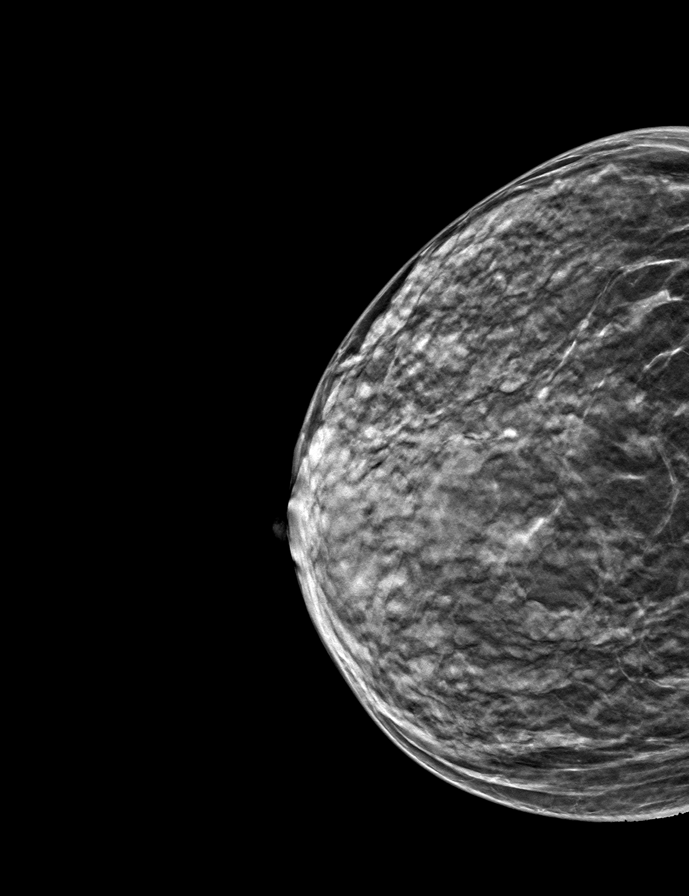

[9 of 24 positions shown; findings below may reference images not displayed]

ACR Breast Density Category d: The breast tissue is extremely dense,
which lowers the sensitivity of mammography.
FINDINGS: There are no findings suspicious for malignancy. Images were
processed with CAD.
IMPRESSION: No mammographic evidence of malignancy. A result letter of this
screening mammogram will be mailed directly to the patient.

RECOMMENDATION:
Screening mammogram in one year. (Code:RA-I-AVB)

BI-RADS CATEGORY  1: Negative.

## 2020-01-27 ENCOUNTER — Other Ambulatory Visit (HOSPITAL_COMMUNITY): Payer: Self-pay | Admitting: Internal Medicine

## 2020-01-27 MED FILL — SHINGRIX 50 MCG SUS: 50 | 1 days supply | Qty: 1 | Fill #0

## 2020-02-17 ENCOUNTER — Ambulatory Visit: Payer: No Typology Code available for payment source | Admitting: Physician Assistant

## 2020-02-19 ENCOUNTER — Encounter: Payer: Self-pay | Admitting: Physician Assistant

## 2020-02-19 ENCOUNTER — Ambulatory Visit (INDEPENDENT_AMBULATORY_CARE_PROVIDER_SITE_OTHER): Payer: No Typology Code available for payment source

## 2020-02-19 ENCOUNTER — Ambulatory Visit (INDEPENDENT_AMBULATORY_CARE_PROVIDER_SITE_OTHER): Payer: No Typology Code available for payment source | Admitting: Physician Assistant

## 2020-02-19 ENCOUNTER — Ambulatory Visit: Payer: Self-pay

## 2020-02-19 DIAGNOSIS — M19011 Primary osteoarthritis, right shoulder: Secondary | ICD-10-CM

## 2020-02-19 DIAGNOSIS — M79645 Pain in left finger(s): Secondary | ICD-10-CM | POA: Diagnosis not present

## 2020-02-19 DIAGNOSIS — M79644 Pain in right finger(s): Secondary | ICD-10-CM

## 2020-02-19 NOTE — Progress Notes (Signed)
Office Visit Note   Patient: Angela Villanueva           Date of Birth: [birth year]           MRN: [id number] Visit Date: 02/19/2020              Requested by: Jordan Hawks, [place],  [place] [address] PCP: Jordan Hawks, NP   Assessment & Plan: Visit Diagnoses:  1. Thumb pain, left   2. Thumb pain, right   3. Primary osteoarthritis of right shoulder     Plan: We will send her for an intra-articular injection of the right shoulder.  Have her follow-up with Dr. Marlou Sa after that to see what type of response she had to the injection and so he can discuss further treatment.  In regards to her bilateral metacarpal phalangeal joint pain both thumbs we will refer to hand surgery.  Questions were encouraged and answered at length  Follow-Up Instructions: Return 3 weeks Dr.Dean.   Orders:  Orders Placed This Encounter  Procedures  . XR Shoulder Right  . XR Hand Complete Left  . XR Hand Complete Right   No orders of the defined types were placed in this encounter.     Procedures: No procedures performed   Clinical Data: No additional findings.   Subjective: Chief Complaint  Patient presents with  . Right Shoulder - Pain  . Left Hand - Pain  . 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  She is having pain in both thumbs and her right shoulder.  Pain is worse with range of motion in regards to the right shoulder.  States the pain is been there for couple months no known injury.  No numbness tingling.  She has tried oral diclofenac without any real results in regards to the right shoulder pain.  She is also having bilateral thumb pain she has tried diclofenac oral and gel for the thumb pain but this is not helped.  She has had no known injury to either hand.  Review of Systems No fevers chills shortness breath chest pain.  Objective: Vital Signs: There were no  vitals taken for this visit.  Physical Exam Constitutional:      Appearance: She is not ill-appearing or diaphoretic.  Pulmonary:     Effort: Pulmonary effort is normal.  Neurological:     Mental Status: She is alert and oriented to person, place, and time.  Psychiatric:        Mood and Affect: Mood normal.     Ortho Exam Bilateral hands: Tenderness around the metacarpal phalangeal joints of both thumbs.  There is significant soft tissue swelling in this region.  Negative grind test bilaterally.  No active triggering of either thumb or any of the remaining fingers.  She has full sensation throughout both hands.  Radial pulses are 2+ bilaterally.  Bilateral shoulders 5-5 strength with external and internal rotation against resistance.  She has decreased forward flexion of the right shoulder compared to the left.  She is decreased external rotation of the right shoulder compared to the left.  Pain with extremes of external rotation right shoulder.  Specialty Comments:  No specialty comments available.  Imaging: XR Hand Complete Left  Result Date: 02/19/2020 Left hand 3 views: No acute fractures.  Subluxation of the metacarpal phalangeal joint of the thumb.  Soft tissue swelling.  Decreased metacarpophalangeal joint space.  XR Hand Complete Right  Result Date: 02/19/2020 Right hand 3 views: No acute fractures.  There is ulnar subluxation and decreased joint space at the metacarpal phalangeal joint.  No other bony abnormalities.  Soft tissue swelling at the metacarpal phalangeal joint  XR Shoulder Right  Result Date: 02/19/2020 Right shoulder 3 views: Shoulder is well located.  Significant near bone-on-bone arthritic changes of the glenohumeral joint.  No acute fractures.  Cystic changes within the humeral head.    PMFS History: Patient Active Problem List   Diagnosis Date Noted  . Gastroparesis 09/01/2015  . Abdominal pain, epigastric 01/22/2014  . Esophageal reflux 05/02/2012   . Dysphagia 05/02/2012  . DUODENITIS, WITHOUT HEMORRHAGE 04/09/2007  . Constipation 04/09/2007  . History of peptic ulcer disease 04/09/2007   Past Medical History:  Diagnosis Date  . Allergy   . Anemia   . Diverticulitis   . Gastric ulcer   . GERD (gastroesophageal reflux disease)     Family History  Problem Relation Age of Onset  . COPD Mother   . Cancer Mother   . Other Mother        blood cloth  . Diabetes Other     Past Surgical History:  Procedure Laterality Date  . ABDOMINAL HYSTERECTOMY    . EYE SURGERY    . fibroid tumor removal    . FOOT SURGERY    . LAPAROSCOPIC GASTROTOMY W/ REPAIR OF ULCER    . PARTIAL HYSTERECTOMY    . TONSILLECTOMY     AS CHILD   Social History   Occupational History  . Occupation: Custodian    Employer: [employer]  Tobacco Use  . Smoking status: Former Smoker    Quit date: 01/04/1992    Years since quitting: 28.1  . Smokeless tobacco: Never Used  Substance and Sexual Activity  . Alcohol use: No  . Drug use: No  . Sexual activity: Not on file

## 2020-02-20 ENCOUNTER — Telehealth: Payer: Self-pay

## 2020-02-20 NOTE — Telephone Encounter (Signed)
LVM for pt to call back.Marland KitchenMarland KitchenNeeds appt with Dr. Junius Roads for Right shoulder injection

## 2020-02-20 NOTE — Addendum Note (Signed)
Addended by: Robyne Peers on: 02/20/2020 08:45 AM   Modules accepted: Orders

## 2020-02-21 ENCOUNTER — Telehealth: Payer: Self-pay | Admitting: Radiology

## 2020-02-21 ENCOUNTER — Other Ambulatory Visit: Payer: Self-pay

## 2020-02-21 ENCOUNTER — Encounter: Payer: Self-pay | Admitting: Family Medicine

## 2020-02-21 ENCOUNTER — Ambulatory Visit: Payer: Self-pay

## 2020-02-21 ENCOUNTER — Ambulatory Visit (INDEPENDENT_AMBULATORY_CARE_PROVIDER_SITE_OTHER): Payer: No Typology Code available for payment source | Admitting: Family Medicine

## 2020-02-21 DIAGNOSIS — M19011 Primary osteoarthritis, right shoulder: Secondary | ICD-10-CM

## 2020-02-21 NOTE — Telephone Encounter (Signed)
I called and left this info on the patient's voice mail. Asked her to call back to let me know if she is coming at 4 or not.

## 2020-02-21 NOTE — Telephone Encounter (Signed)
Came in today and had the glenohumeral injection.  See ov note.

## 2020-02-21 NOTE — Telephone Encounter (Signed)
Please advise 

## 2020-02-21 NOTE — Telephone Encounter (Signed)
If it's just for an injection, can bring her in at 4 and will get it done quickly.

## 2020-02-21 NOTE — Telephone Encounter (Signed)
Patient called requesting work in for shoulder injection today if possible. 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 She thinks that she may be able to get off at 4pm. I advised 4:20p is Dr. Junius Roads latest appt and that he is completely booked today. She would like to know if there is something that you could do to get her worked in? She has appt on Monday.  Please call and leave voicemail at [phone]

## 2020-02-21 NOTE — Progress Notes (Signed)
Subjective: Patient is here for ultrasound-guided intra-articular right glenohumeral injection.    Objective:  Pain and decreased ROM with overhead and behind the back reach.  Procedure: Ultrasound guided injection is preferred based studies that show increased  duration, increased effect, greater accuracy, decreased procedural pain,  increased response rate, and decreased cost with ultrasound guided versus  blind injection.  Verbal informed consent obtained. Time-out conducted.   Noted no overlying erythema, induration, or other signs of local  infection. Ultrasound-guided right glenohumeral injection: After sterile  prep with Betadine, injected 4 cc 0.25% bupivocaine without epinephrine  and 6 mg betamethasone using a 22-gauge spinal needle, passing the needle  from posterior approach into the glenohumeral joint. Injectate seen  filling joint capsule. Good immediate relief.

## 2020-02-24 ENCOUNTER — Ambulatory Visit: Payer: No Typology Code available for payment source | Admitting: Family Medicine

## 2020-03-05 MED FILL — CYCLOSPORINE 0.05 % EMUL: 0.05 | 30 days supply | Qty: 60 | Fill #3

## 2020-03-09 ENCOUNTER — Telehealth: Payer: Self-pay | Admitting: Physician Assistant

## 2020-03-09 NOTE — Telephone Encounter (Signed)
E 

## 2020-03-10 MED FILL — AMLODIPINE BESYLATE 5 MG TA: 5 | 90 days supply | Qty: 90 | Fill #0

## 2020-03-16 ENCOUNTER — Other Ambulatory Visit (HOSPITAL_COMMUNITY): Payer: Self-pay | Admitting: Nurse Practitioner

## 2020-03-18 ENCOUNTER — Other Ambulatory Visit (HOSPITAL_COMMUNITY): Payer: Self-pay | Admitting: Orthopedic Surgery

## 2020-03-23 ENCOUNTER — Other Ambulatory Visit: Payer: Self-pay

## 2020-03-23 ENCOUNTER — Ambulatory Visit (INDEPENDENT_AMBULATORY_CARE_PROVIDER_SITE_OTHER): Payer: No Typology Code available for payment source | Admitting: Orthopedic Surgery

## 2020-03-23 ENCOUNTER — Other Ambulatory Visit (HOSPITAL_COMMUNITY): Payer: Self-pay | Admitting: Surgical

## 2020-03-23 DIAGNOSIS — M19011 Primary osteoarthritis, right shoulder: Secondary | ICD-10-CM

## 2020-03-23 MED ORDER — CELECOXIB 100 MG PO CAPS: 100.0000 mg | ORAL_CAPSULE | Freq: Two times a day (BID) | ORAL | 1 refills | Status: DC

## 2020-03-23 MED FILL — CELECOXIB 100 MG CAPS: 100 | 30 days supply | Qty: 60 | Fill #0

## 2020-03-24 ENCOUNTER — Encounter: Payer: Self-pay | Admitting: Orthopedic Surgery

## 2020-03-24 NOTE — Progress Notes (Signed)
Office Visit Note   Patient: Angela Villanueva           Date of Birth: 28-May-1955           MRN: [id number] Visit Date: 03/23/2020 Requested by: Jordan Hawks, [place],   [id number] PCP: Jordan Hawks, NP  Subjective: Chief Complaint  Patient presents with  . Right Shoulder - Pain    HPI: Angela Villanueva is a 65 y.o. female who presents to the office complaining of right shoulder pain.  Patient notes shoulder pain has been worsening over the last several months.  Localizes pain diffusely throughout her right shoulder with radiation to the elbow.  Denies any radiation past the elbow.  Denies any neck pain or numbness/tingling.  No history of injury and no previous surgery to the right shoulder.  She was seen by Artis Delay and diagnosed with right shoulder osteoarthritis after obtaining right shoulder radiographs.  She was referred to see Dr. Junius Roads for glenohumeral injection on 02/21/2020.  She states this injection provided no relief and in fact made her pain worse.  She has been taking Aleve without any significant relief.  Denies any significant medical history and specifically denies CKD, heart disease, diabetes, use of blood thinners, smoking.  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  Pain does get in the way of her working makes it difficult to work but she has not had to miss time due to work.  She does wake with pain at night to the right shoulder..                ROS: All systems reviewed are negative as they relate to the chief complaint within the history of present illness.  Patient denies fevers or chills.  Assessment & Plan: Visit Diagnoses:  1. Primary osteoarthritis of right shoulder     Plan: Patient is a 65 year old female who presents complaining of right shoulder pain.  She has had worsening right shoulder pain over the last several months that wakes her up at night.  Glenohumeral injection provided no  relief.  She has right shoulder radiographs that show severe end-stage right shoulder glenohumeral arthritis with osteophyte formation, joint space narrowing, subchondral cyst.  Discussed options available to patient.  She would not like to try another injection as that worsened her pain.  Discussed shoulder replacement and the recovery timeline.  After discussion, patient is open to considering surgery but would like to try stronger oral anti-inflammatories first to see if this will improve her pain to the point where she can put surgery off.  If these stronger oral meds did not provide lasting relief, she will return to discuss surgical intervention.  Prescribe Celebrex 100 mg twice daily.  Follow-up in 6 weeks for clinical recheck.  Follow-Up Instructions: No follow-ups on file.   Orders:  No orders of the defined types were placed in this encounter.  Meds ordered this encounter  Medications  . celecoxib (CELEBREX) 100 MG capsule    Sig: Take 1 capsule (100 mg total) by mouth 2 (two) times daily.    Dispense:  60 capsule    Refill:  1      Procedures: No procedures performed   Clinical Data: No additional findings.  Objective: Vital Signs: There were no vitals taken for this visit.  Physical Exam:  Constitutional: Patient appears well-developed HEENT:  Head: Normocephalic Eyes:EOM are normal Neck: Normal range of motion  Cardiovascular: Normal rate Pulmonary/chest: Effort normal Neurologic: Patient is alert Skin: Skin is warm Psychiatric: Patient has normal mood and affect  Ortho Exam: Ortho exam demonstrates right shoulder with passive external rotation to 40 degrees, passive abduction to 120 degrees, passive forward flexion to 170 degrees.  Excellent supraspinatus, infraspinatus, subscapularis strength of the right shoulder.  Positive crepitus that is consistent with osteoarthritis with passive motion of the shoulder.  No tenderness throughout the axial cervical spine.   Negative Spurling sign.  5/5 motor strength of bilateral grip strength, finger abduction, pronation/supination, bicep, tricep, deltoid.  Axillary nerve intact with deltoid firing.  Specialty Comments:  No specialty comments available.  Imaging: No results found.   PMFS History: Patient Active Problem List   Diagnosis Date Noted  . Gastroparesis 09/01/2015  . Abdominal pain, epigastric 01/22/2014  . Esophageal reflux 05/02/2012  . Dysphagia 05/02/2012  . DUODENITIS, WITHOUT HEMORRHAGE 04/09/2007  . Constipation 04/09/2007  . History of peptic ulcer disease 04/09/2007   Past Medical History:  Diagnosis Date  . Allergy   . Anemia   . Diverticulitis   . Gastric ulcer   . GERD (gastroesophageal reflux disease)     Family History  Problem Relation Age of Onset  . COPD Mother   . Cancer Mother   . Other Mother        blood cloth  . Diabetes Other     Past Surgical History:  Procedure Laterality Date  . ABDOMINAL HYSTERECTOMY    . EYE SURGERY    . fibroid tumor removal    . FOOT SURGERY    . LAPAROSCOPIC GASTROTOMY W/ REPAIR OF ULCER    . PARTIAL HYSTERECTOMY    . TONSILLECTOMY     AS CHILD   Social History   Occupational History  . Occupation: Custodian    Employer: [employer]  Tobacco Use  . Smoking status: Former Smoker    Quit date: 01/04/1992    Years since quitting: 28.2  . Smokeless tobacco: Never Used  Substance and Sexual Activity  . Alcohol use: No  . Drug use: No  . Sexual activity: Not on file

## 2020-03-30 MED FILL — SHINGRIX 50 MCG SUS: 50 | 1 days supply | Qty: 1 | Fill #1

## 2020-05-04 ENCOUNTER — Ambulatory Visit (INDEPENDENT_AMBULATORY_CARE_PROVIDER_SITE_OTHER): Payer: No Typology Code available for payment source | Admitting: Orthopedic Surgery

## 2020-05-04 ENCOUNTER — Other Ambulatory Visit (HOSPITAL_COMMUNITY): Payer: Self-pay

## 2020-05-04 DIAGNOSIS — M19011 Primary osteoarthritis, right shoulder: Secondary | ICD-10-CM | POA: Diagnosis not present

## 2020-05-04 MED ORDER — CYCLOSPORINE 0.05 % OP EMUL
OPHTHALMIC | 11 refills | Status: AC
  Filled 2020-05-04: qty 60, 30d supply, fill #0
  Filled 2020-07-10: qty 60, 30d supply, fill #1

## 2020-05-05 ENCOUNTER — Encounter: Payer: Self-pay | Admitting: Orthopedic Surgery

## 2020-05-05 NOTE — Progress Notes (Signed)
Office Visit Note   Patient: Angela Villanueva           Date of Birth: 1955/10/02           MRN: [id number] Visit Date: 05/04/2020 Requested by: Jordan Hawks, [address] [place],  [place] [id number] PCP: Jordan Hawks, NP  Subjective: Chief Complaint  Patient presents with  . Right Shoulder - Follow-up    HPI: Angela Villanueva is a 65 year old patient with right shoulder pain.  She describes worsening shoulder pain.  She states she is "ready for surgery".  She has still been working.  Has end-stage arthritis affecting the shoulder.  Has night pain rest pain as well as pain which interferes with her activities of daily living.  Does have husband at home who can help her recover.              ROS: All systems reviewed are negative as they relate to the chief complaint within the history of present illness.  Patient denies  fevers or chills.   Assessment & Plan: Visit Diagnoses:  1. Primary osteoarthritis of right shoulder     Plan: Impression is primary osteoarthritis of the right shoulder in a 65 year old patient with well-maintained motion and good rotator cuff strength.  Plan is thin cut CT scan for preoperative planning purposes.  At this time I would be inclined to go with anatomic shoulder replacement with convertible glenoid if augmented glenoid not required.  Risk and benefits of the surgery are discussed with the patient including but not limited to infection nerve vessel damage incomplete functional restoration as well as possible revision surgery in her lifetime.  Her work does not involve particularly heavy lifting.  Anticipate out of work time around 3 months.  All questions answered.  Follow-Up Instructions: No follow-ups on file.   Orders:  Orders Placed This Encounter  Procedures  . CT SHOULDER RIGHT WO CONTRAST   No orders of the defined types were placed in this encounter.     Procedures: No procedures performed   Clinical Data: No  additional findings.  Objective: Vital Signs: There were no vitals taken for this visit.  Physical Exam:   Constitutional: Patient appears well-developed HEENT:  Head: Normocephalic Eyes:EOM are normal Neck: Normal range of motion Cardiovascular: Normal rate Pulmonary/chest: Effort normal Neurologic: Patient is alert Skin: Skin is warm Psychiatric: Patient has normal mood and affect    Ortho Exam: Ortho exam demonstrates full active and passive range of motion of the cervical spine.  She has good rotator cuff strength on the right infraspinatus which may subscap muscle testing.  Passive range of motion on the right is 50/90/160.  She does have some coarseness consistent with bone-on-bone arthritis and not rotator cuff pathology.  Motor or sensory function to the hand is intact.  Deltoid is functional.  Specialty Comments:  No specialty comments available.  Imaging: No results found.   PMFS History: Patient Active Problem List   Diagnosis Date Noted  . Gastroparesis 09/01/2015  . Abdominal pain, epigastric 01/22/2014  . Esophageal reflux 05/02/2012  . Dysphagia 05/02/2012  . DUODENITIS, WITHOUT HEMORRHAGE 04/09/2007  . Constipation 04/09/2007  . History of peptic ulcer disease 04/09/2007   Past Medical History:  Diagnosis Date  . Allergy   . Anemia   . Diverticulitis   . Gastric ulcer   . GERD (gastroesophageal reflux disease)     Family History  Problem Relation Age of Onset  . COPD Mother   .  Cancer Mother   . Other Mother        blood cloth  . Diabetes Other     Past Surgical History:  Procedure Laterality Date  . ABDOMINAL HYSTERECTOMY    . EYE SURGERY    . fibroid tumor removal    . FOOT SURGERY    . LAPAROSCOPIC GASTROTOMY W/ REPAIR OF ULCER    . PARTIAL HYSTERECTOMY    . TONSILLECTOMY     AS CHILD   Social History   Occupational History  . Occupation: Custodian    Employer: [employer]  Tobacco Use  . Smoking status: Former Smoker     Quit date: 01/04/1992    Years since quitting: 28.3  . Smokeless tobacco: Never Used  Substance and Sexual Activity  . Alcohol use: No  . Drug use: No  . Sexual activity: Not on file

## 2020-05-09 ENCOUNTER — Ambulatory Visit
Admission: RE | Admit: 2020-05-09 | Discharge: 2020-05-09 | Disposition: A | Discharge status: Home / Self Care | Payer: No Typology Code available for payment source | Source: Ambulatory Visit | Attending: Orthopedic Surgery | Admitting: Orthopedic Surgery

## 2020-05-09 ENCOUNTER — Other Ambulatory Visit: Payer: Self-pay

## 2020-05-09 DIAGNOSIS — M19011 Primary osteoarthritis, right shoulder: Secondary | ICD-10-CM

## 2020-05-11 NOTE — Progress Notes (Signed)
blue sheet done.  Can you call her and let her know that we are getting her on the schedule.  Thanks

## 2020-05-12 NOTE — Progress Notes (Signed)
Patient is scheduled for right total shoulder arthroplasty with Dr. Marlou Sa at [facility] on 06-16-20 @11 :15am.

## 2020-06-02 ENCOUNTER — Other Ambulatory Visit: Payer: Self-pay

## 2020-06-03 ENCOUNTER — Telehealth: Payer: Self-pay | Admitting: Orthopedic Surgery

## 2020-06-03 NOTE — Telephone Encounter (Signed)
Received $25.00 cash from patient/Forwarding to [employer] today 

## 2020-06-09 ENCOUNTER — Other Ambulatory Visit (HOSPITAL_COMMUNITY): Payer: Self-pay

## 2020-06-11 NOTE — Progress Notes (Addendum)
Surgical Instructions    Your procedure is scheduled on Tuesday June 14th.  Report to [facility] Main Entrance "A" at 9 A.M., then check in with the Admitting office.  Call this number if you have problems the morning of surgery:  [phone]   If you have any questions prior to your surgery date call [phone]: Open Monday-Friday 8am-4pm    Remember:  Do not eat after midnight the night before your surgery  You may drink clear liquids until 8am the morning of your surgery.   Clear liquids allowed are: Water, Non-Citrus Juices (without pulp), Carbonated Beverages, Clear Tea, Black Coffee Only, and Gatorade   Enhanced Recovery after Surgery for Orthopedics Enhanced Recovery after Surgery is a protocol used to improve the stress on your body and your recovery after surgery.  Patient Instructions  The day of surgery (if you do NOT have diabetes):  Drink ONE (1) Pre-Surgery Clear Ensure by __8___ am the morning of surgery   This drink was given to you during your hospital  pre-op appointment visit. Nothing else to drink after completing the  Pre-Surgery Clear Ensure.         If you have questions, please contact your surgeon's office.     Take these medicines the morning of surgery with A SIP OF WATER  amLODipine (NORVASC) 5 MG tablet cycloSPORINE (RESTASIS) 0.05 % ophthalmic emulsion Omeprazole-Sodium Bicarbonate (ZEGERID) 20-1100 MG CAPS capsule   As of today, STOP taking any Aspirin (unless otherwise instructed by your surgeon) Diclofenac, Meloxicam, Aleve, Naproxen, Ibuprofen, Motrin, Advil, Goody's, BC's, all herbal medications, fish oil, and all vitamins.          Do not wear jewelry or makeup Do not wear lotions, powders, perfumes, or deodorant. Do not shave 48 hours prior to surgery.   DO Not wear nail polish, gel polish, artificial nails, or any other type of covering on  natural nails including finger and toenails. If patients have artificial nails, gel coating,  etc. that need to be removed by a nail salon please have this removed prior to surgery or surgery may need to be canceled/delayed if the surgeon/ anesthesia feels like the patient is unable to be adequately monitored.             [facility] is not responsible for any belongings or valuables.  Do NOT Smoke (Tobacco/Vaping) or drink Alcohol 24 hours prior to your procedure If you use a CPAP at night, you may bring all equipment for your overnight stay.   Contacts, glasses, dentures or bridgework may not be worn into surgery, please bring cases for these belongings   For patients admitted to the hospital, discharge time will be determined by your treatment team.   Patients discharged the day of surgery will not be allowed to drive home, and someone needs to stay with them for 24 hours.  ONLY 1 SUPPORT PERSON MAY BE PRESENT WHILE YOU ARE IN SURGERY. IF YOU ARE TO BE ADMITTED ONCE YOU ARE IN YOUR ROOM YOU WILL BE ALLOWED TWO (2) VISITORS.  Minor children may have two parents present. Special consideration for safety and communication needs will be reviewed on a case by case basis.  Special instructions:    Oral Hygiene is also important to reduce your risk of infection.  Remember - BRUSH YOUR TEETH THE MORNING OF SURGERY WITH YOUR REGULAR TOOTHPASTE  [place]- Preparing for Shoulder Surgery  ?  Before surgery, you can play an important role. Because skin is not  sterile, your skin needs to be as free of germs as possible. You can reduce the number of germs on your skin by using the following products.   1). Benzoyl Peroxide Gel: reduces the number of germs present on the skin   *Applied twice a day to shoulder area starting two days before surgery     2). Chlorhexidine Gluconate (CHG) Soap: An antiseptic cleaner that kills germs and bonds with the skin to continue killing germs even after washing   *Used for showering the night before surgery and morning of surgery   ?    Please  follow these instructions carefully:     1). BENZOYL PEROXIDE 5% GEL (Please do not use if you have an allergy to benzoyl peroxide.   If your skin becomes reddened/irritated stop using the benzoyl peroxide)     Starting TWO DAYS BEFORE surgery:    Apply benzoyl peroxide in the morning and at night. Apply after taking a shower. If you are not taking a shower clean entire shoulder front, back, and side along with the armpit with a clean wet washcloth.     Place a quarter-sized amount of gel on your shoulder and rub in thoroughly, making sure to cover the front, back, and side of your shoulder, along with the armpit.                           Do this twice a day for two days.  (Last application is the night before surgery, AFTER using the CHG soap as described below).   Do NOT apply benzoyl peroxide gel on the day of surgery.   2 days before ____ AM   ____ PM              1 day before ____ AM   ____ PM    2) CHG Soap: Please do not use if you have an allergy to CHG or antibacterial soaps. If your skin becomes reddened/irritated stop using the CHG.  Do not shave (including legs and underarms) for at least 48 hours prior to first CHG shower. It is OK to shave your face.  Please follow these instructions carefully.   Shower the NIGHT BEFORE SURGERY (before applying benzoyl peroxide gel) and the MORNING OF SURGERY with CHG Soap.   If you chose to wash your hair, wash your hair first as usual with your normal shampoo.  After you shampoo, rinse your hair and body thoroughly to remove the shampoo.  Use CHG as you would any other liquid soap. You can apply CHG directly to the skin and wash gently with a scrungie or a clean washcloth.   Apply the CHG Soap to your body ONLY FROM THE NECK DOWN.  Do not use on open wounds or open sores. Avoid contact with your eyes, ears, mouth and genitals (private parts). Wash Face and genitals (private parts) with your normal soap.   Wash thoroughly, paying  special attention to the area where your surgery will be performed.  Thoroughly rinse your body with warm water from the neck down.  DO NOT shower/wash with your normal soap after using and rinsing off the CHG Soap.  Pat yourself dry with a CLEAN TOWEL.  Wear CLEAN PAJAMAS to bed the night before surgery  Place CLEAN SHEETS on your bed the night of your first shower and DO NOT SLEEP WITH PETS.  Oral Hygiene is also important to reduce your risk of infection.  Remember - BRUSH YOUR TEETH THE MORNING OF SURGERY WITH YOUR REGULAR TOOTHPASTE  Day of Surgery: Wear Clean/Comfortable clothing the morning of surgery Do not apply any deodorants/lotions.   Remember to brush your teeth WITH YOUR REGULAR TOOTHPASTE.     Please read over the following fact sheets that you were given.

## 2020-06-12 ENCOUNTER — Other Ambulatory Visit (HOSPITAL_COMMUNITY): Payer: No Typology Code available for payment source

## 2020-06-12 ENCOUNTER — Other Ambulatory Visit: Payer: Self-pay

## 2020-06-12 ENCOUNTER — Encounter (HOSPITAL_COMMUNITY): Payer: Self-pay

## 2020-06-12 ENCOUNTER — Encounter (HOSPITAL_COMMUNITY)
Admission: RE | Admit: 2020-06-12 | Discharge: 2020-06-12 | Disposition: A | Discharge status: Home / Self Care | Payer: No Typology Code available for payment source | Source: Ambulatory Visit | Attending: Orthopedic Surgery | Admitting: Orthopedic Surgery

## 2020-06-12 ENCOUNTER — Telehealth: Payer: Self-pay

## 2020-06-12 DIAGNOSIS — Z01812 Encounter for preprocedural laboratory examination: Secondary | ICD-10-CM | POA: Insufficient documentation

## 2020-06-12 DIAGNOSIS — Z20822 Contact with and (suspected) exposure to covid-19: Secondary | ICD-10-CM | POA: Insufficient documentation

## 2020-06-12 HISTORY — DX: Inflammatory liver disease, unspecified: K75.9

## 2020-06-12 HISTORY — DX: Unspecified osteoarthritis, unspecified site: M19.90

## 2020-06-12 LAB — CBC
HCT: 41.5 % (ref 36.0–46.0)
Hemoglobin: 13.6 g/dL (ref 12.0–15.0)
MCH: 29.6 pg (ref 26.0–34.0)
MCHC: 32.8 g/dL (ref 30.0–36.0)
MCV: 90.2 fL (ref 80.0–100.0)
Platelets: 188 10*3/uL (ref 150–400)
RBC: 4.6 MIL/uL (ref 3.87–5.11)
RDW: 11.9 % (ref 11.5–15.5)
WBC: 8.7 10*3/uL (ref 4.0–10.5)
nRBC: 0 % (ref 0.0–0.2)

## 2020-06-12 LAB — SURGICAL PCR SCREEN
MRSA, PCR: NEGATIVE
Staphylococcus aureus: NEGATIVE

## 2020-06-12 LAB — URINALYSIS, ROUTINE W REFLEX MICROSCOPIC
Bilirubin Urine: NEGATIVE
Glucose, UA: NEGATIVE mg/dL
Hgb urine dipstick: NEGATIVE
Ketones, ur: NEGATIVE mg/dL
Leukocytes,Ua: NEGATIVE
Nitrite: NEGATIVE
Protein, ur: NEGATIVE mg/dL
Specific Gravity, Urine: 1.005 (ref 1.005–1.030)
pH: 7 (ref 5.0–8.0)

## 2020-06-12 LAB — BASIC METABOLIC PANEL
Anion gap: 8 (ref 5–15)
BUN: 6 mg/dL — ABNORMAL LOW (ref 8–23)
CO2: 32 mmol/L (ref 22–32)
Calcium: 9 mg/dL (ref 8.9–10.3)
Chloride: 96 mmol/L — ABNORMAL LOW (ref 98–111)
Creatinine, Ser: 0.73 mg/dL (ref 0.44–1.00)
GFR, Estimated: >60 mL/min (ref >60)
Glucose, Bld: 89 mg/dL (ref 70–99)
Potassium: 3.1 mmol/L — ABNORMAL LOW (ref 3.5–5.1)
Sodium: 136 mmol/L (ref 135–145)

## 2020-06-12 LAB — SARS CORONAVIRUS 2 (TAT 6-24 HRS): SARS Coronavirus 2: NEGATIVE

## 2020-06-12 NOTE — Telephone Encounter (Signed)
Just heads up, Sonia Side will not be able to provide patient with HHPT as his company is not in network with patients insurance. Will need to be referred to an alternate HHPT

## 2020-06-12 NOTE — Telephone Encounter (Signed)
Okay thanks. Case manager should be able to get it worked out in hospital

## 2020-06-12 NOTE — Progress Notes (Signed)
PCP - Jordan Hawks FNP   Chest x-ray - Not indicated EKG - Not indicated Stress Test - Denies ECHO - Denies Cardiac Cath - Denies  Sleep Study - Denies  DM - Denies  ERAS Protcol - Yes PRE-SURGERY Ensure given   COVID TEST- 06/12/20  Anesthesia review: Yes  Patient denies shortness of breath, fever, cough and chest pain at PAT appointment   All instructions explained to the patient, with a verbal understanding of the material. Patient agrees to go over the instructions while at home for a better understanding. Patient also instructed to wear mask in public after being tested for COVID-19. The opportunity to ask questions was provided.

## 2020-06-13 LAB — URINE CULTURE: Culture: <10000 — AB

## 2020-06-15 ENCOUNTER — Encounter (HOSPITAL_COMMUNITY): Payer: Self-pay | Admitting: Orthopedic Surgery

## 2020-06-15 NOTE — Anesthesia Preprocedure Evaluation (Addendum)
Anesthesia Evaluation  Patient identified by MRN, date of birth, ID band Patient awake    Reviewed: Allergy & Precautions, NPO status , Patient's Chart, lab work & pertinent test results  Airway Mallampati: I       Dental no notable dental hx.    Pulmonary neg pulmonary ROS, former smoker,    Pulmonary exam normal        Cardiovascular hypertension, Pt. on medications Normal cardiovascular exam     Neuro/Psych negative neurological ROS  negative psych ROS   GI/Hepatic PUD, GERD  Medicated and Controlled,  Endo/Other  negative endocrine ROS  Renal/GU negative Renal ROS  negative genitourinary   Musculoskeletal  (+) Arthritis , Osteoarthritis,    Abdominal Normal abdominal exam  (+)   Peds  Hematology   Anesthesia Other Findings   Reproductive/Obstetrics                            Anesthesia Physical Anesthesia Plan  ASA: 2  Anesthesia Plan: General   Post-op Pain Management:  Regional for Post-op pain   Induction: Intravenous  PONV Risk Score and Plan: 3 and Ondansetron, Dexamethasone and Midazolam  Airway Management Planned: Oral ETT  Additional Equipment: None  Intra-op Plan:   Post-operative Plan: Extubation in OR  Informed Consent: I have reviewed the patients History and Physical, chart, labs and discussed the procedure including the risks, benefits and alternatives for the proposed anesthesia with the patient or authorized representative who has indicated his/her understanding and acceptance.     Dental advisory given  Plan Discussed with: CRNA  Anesthesia Plan Comments:        Anesthesia Quick Evaluation

## 2020-06-16 ENCOUNTER — Encounter (HOSPITAL_COMMUNITY): Payer: Self-pay | Admitting: Orthopedic Surgery

## 2020-06-16 ENCOUNTER — Encounter (HOSPITAL_COMMUNITY)
Admission: RE | Disposition: A | Discharge status: Home / Self Care | Payer: Self-pay | Source: Home / Self Care | Attending: Orthopedic Surgery

## 2020-06-16 ENCOUNTER — Observation Stay (HOSPITAL_COMMUNITY)
Admission: RE | Admit: 2020-06-16 | Discharge: 2020-06-17 | Disposition: A | Discharge status: Home / Self Care | Payer: No Typology Code available for payment source | Attending: Orthopedic Surgery | Admitting: Orthopedic Surgery

## 2020-06-16 ENCOUNTER — Observation Stay (HOSPITAL_COMMUNITY): Payer: No Typology Code available for payment source

## 2020-06-16 ENCOUNTER — Ambulatory Visit (HOSPITAL_COMMUNITY): Payer: No Typology Code available for payment source | Admitting: Anesthesiology

## 2020-06-16 ENCOUNTER — Other Ambulatory Visit: Payer: Self-pay

## 2020-06-16 DIAGNOSIS — Z87891 Personal history of nicotine dependence: Secondary | ICD-10-CM | POA: Diagnosis not present

## 2020-06-16 DIAGNOSIS — I10 Essential (primary) hypertension: Secondary | ICD-10-CM | POA: Insufficient documentation

## 2020-06-16 DIAGNOSIS — Z79899 Other long term (current) drug therapy: Secondary | ICD-10-CM | POA: Insufficient documentation

## 2020-06-16 DIAGNOSIS — Z96611 Presence of right artificial shoulder joint: Secondary | ICD-10-CM

## 2020-06-16 DIAGNOSIS — Z9889 Other specified postprocedural states: Secondary | ICD-10-CM

## 2020-06-16 DIAGNOSIS — M19019 Primary osteoarthritis, unspecified shoulder: Secondary | ICD-10-CM

## 2020-06-16 DIAGNOSIS — M19011 Primary osteoarthritis, right shoulder: Secondary | ICD-10-CM | POA: Diagnosis present

## 2020-06-16 HISTORY — PX: TOTAL SHOULDER ARTHROPLASTY: SHX126

## 2020-06-16 HISTORY — DX: Essential (primary) hypertension: I10

## 2020-06-16 SURGERY — ARTHROPLASTY, SHOULDER, TOTAL
Anesthesia: General | Site: Shoulder | Laterality: Right

## 2020-06-16 MED ORDER — MIDAZOLAM HCL 2 MG/2ML IJ SOLN
1.0000 mg | Freq: Once | INTRAMUSCULAR | Status: DC
  Filled 2020-06-16: qty 1
  Filled 2020-06-16: qty 2

## 2020-06-16 MED ORDER — FENTANYL CITRATE (PF) 250 MCG/5ML IJ SOLN
INTRAMUSCULAR | Status: DC | PRN
  Administered 2020-06-16: 150 ug via INTRAVENOUS

## 2020-06-16 MED ORDER — FENTANYL CITRATE (PF) 100 MCG/2ML IJ SOLN
INTRAMUSCULAR | Status: AC
  Filled 2020-06-16: qty 2

## 2020-06-16 MED ORDER — PHENOL 1.4 % MT LIQD: 1.0000 | OROMUCOSAL | Status: DC | PRN

## 2020-06-16 MED ORDER — ACETAMINOPHEN 500 MG PO TABS
500.0000 mg | ORAL_TABLET | Freq: Four times a day (QID) | ORAL | Status: DC
  Administered 2020-06-16 (×2): 500 mg via ORAL
  Filled 2020-06-16 (×2): qty 1

## 2020-06-16 MED ORDER — DEXAMETHASONE SODIUM PHOSPHATE 10 MG/ML IJ SOLN
INTRAMUSCULAR | Status: AC
  Filled 2020-06-16: qty 1

## 2020-06-16 MED ORDER — FENTANYL CITRATE (PF) 250 MCG/5ML IJ SOLN
INTRAMUSCULAR | Status: AC
  Filled 2020-06-16: qty 5

## 2020-06-16 MED ORDER — CHLORHEXIDINE GLUCONATE 0.12 % MT SOLN
OROMUCOSAL | Status: AC
  Administered 2020-06-16: 15 mL via OROMUCOSAL
  Filled 2020-06-16: qty 15

## 2020-06-16 MED ORDER — ORAL CARE MOUTH RINSE: 15.0000 mL | Freq: Once | OROMUCOSAL | Status: AC

## 2020-06-16 MED ORDER — LACTATED RINGERS IV SOLN: INTRAVENOUS | Status: DC

## 2020-06-16 MED ORDER — BUPIVACAINE-EPINEPHRINE 0.5% -1:200000 IJ SOLN
INTRAMUSCULAR | Status: AC
  Filled 2020-06-16: qty 1

## 2020-06-16 MED ORDER — HYDROMORPHONE HCL 1 MG/ML IJ SOLN
INTRAMUSCULAR | Status: AC
  Filled 2020-06-16: qty 1

## 2020-06-16 MED ORDER — LACTATED RINGERS IV SOLN: INTRAVENOUS | Status: DC | PRN

## 2020-06-16 MED ORDER — AMLODIPINE BESYLATE 5 MG PO TABS
5.0000 mg | ORAL_TABLET | Freq: Every day | ORAL | Status: DC
  Administered 2020-06-16 – 2020-06-17 (×2): 5 mg via ORAL
  Filled 2020-06-16 (×2): qty 1

## 2020-06-16 MED ORDER — ONDANSETRON HCL 4 MG/2ML IJ SOLN
INTRAMUSCULAR | Status: DC | PRN
  Administered 2020-06-16: 4 mg via INTRAVENOUS

## 2020-06-16 MED ORDER — PROMETHAZINE HCL 25 MG/ML IJ SOLN: 6.2500 mg | INTRAMUSCULAR | Status: DC | PRN

## 2020-06-16 MED ORDER — KETOROLAC TROMETHAMINE 30 MG/ML IJ SOLN
INTRAMUSCULAR | Status: AC
  Filled 2020-06-16: qty 1

## 2020-06-16 MED ORDER — MENTHOL 3 MG MT LOZG: 1.0000 | LOZENGE | OROMUCOSAL | Status: DC | PRN

## 2020-06-16 MED ORDER — METOCLOPRAMIDE HCL 5 MG PO TABS: 5.0000 mg | ORAL_TABLET | Freq: Three times a day (TID) | ORAL | Status: DC | PRN

## 2020-06-16 MED ORDER — ROCURONIUM BROMIDE 10 MG/ML (PF) SYRINGE
PREFILLED_SYRINGE | INTRAVENOUS | Status: DC | PRN
  Administered 2020-06-16: 50 mg via INTRAVENOUS

## 2020-06-16 MED ORDER — PROPOFOL 10 MG/ML IV BOLUS
INTRAVENOUS | Status: AC
  Filled 2020-06-16: qty 20

## 2020-06-16 MED ORDER — METOCLOPRAMIDE HCL 5 MG/ML IJ SOLN: 5.0000 mg | Freq: Three times a day (TID) | INTRAMUSCULAR | Status: DC | PRN

## 2020-06-16 MED ORDER — HYDROCODONE-ACETAMINOPHEN 5-325 MG PO TABS
ORAL_TABLET | ORAL | Status: AC
  Administered 2020-06-16: 2
  Filled 2020-06-16: qty 2

## 2020-06-16 MED ORDER — VANCOMYCIN HCL IN DEXTROSE 1-5 GM/200ML-% IV SOLN
1000.0000 mg | INTRAVENOUS | Status: AC
  Administered 2020-06-16: 1000 mg via INTRAVENOUS
  Filled 2020-06-16: qty 200

## 2020-06-16 MED ORDER — PHENYLEPHRINE HCL-NACL 10-0.9 MG/250ML-% IV SOLN
INTRAVENOUS | Status: DC | PRN
  Administered 2020-06-16: 30 ug/min via INTRAVENOUS

## 2020-06-16 MED ORDER — SUGAMMADEX SODIUM 200 MG/2ML IV SOLN
INTRAVENOUS | Status: DC | PRN
  Administered 2020-06-16: 100 mg via INTRAVENOUS

## 2020-06-16 MED ORDER — ONDANSETRON HCL 4 MG/2ML IJ SOLN
INTRAMUSCULAR | Status: AC
  Filled 2020-06-16: qty 2

## 2020-06-16 MED ORDER — HYDROMORPHONE HCL 1 MG/ML IJ SOLN
0.2500 mg | INTRAMUSCULAR | Status: DC | PRN
  Administered 2020-06-16 (×2): 0.5 mg via INTRAVENOUS

## 2020-06-16 MED ORDER — POVIDONE-IODINE 10 % EX SWAB: 2.0000 "application " | Freq: Once | CUTANEOUS | Status: DC

## 2020-06-16 MED ORDER — POVIDONE-IODINE 7.5 % EX SOLN
Freq: Once | CUTANEOUS | Status: DC
  Filled 2020-06-16: qty 118

## 2020-06-16 MED ORDER — VANCOMYCIN HCL 1000 MG IV SOLR
INTRAVENOUS | Status: DC | PRN
  Administered 2020-06-16: 1000 mg

## 2020-06-16 MED ORDER — PANTOPRAZOLE SODIUM 40 MG PO TBEC
40.0000 mg | DELAYED_RELEASE_TABLET | Freq: Every day | ORAL | Status: DC
  Administered 2020-06-16 – 2020-06-17 (×2): 40 mg via ORAL
  Filled 2020-06-16 (×2): qty 1

## 2020-06-16 MED ORDER — MORPHINE SULFATE (PF) 2 MG/ML IV SOLN
0.5000 mg | INTRAVENOUS | Status: DC | PRN
Start: 2020-06-16 — End: 2020-06-17
  Administered 2020-06-16: 1 mg via INTRAVENOUS
  Filled 2020-06-16: qty 1

## 2020-06-16 MED ORDER — VANCOMYCIN HCL 1000 MG IV SOLR
INTRAVENOUS | Status: AC
  Filled 2020-06-16: qty 1000

## 2020-06-16 MED ORDER — BUPIVACAINE LIPOSOME 1.3 % IJ SUSP
INTRAMUSCULAR | Status: DC | PRN
  Administered 2020-06-16 (×5): 2 mL via PERINEURAL

## 2020-06-16 MED ORDER — DOCUSATE SODIUM 100 MG PO CAPS
100.0000 mg | ORAL_CAPSULE | Freq: Two times a day (BID) | ORAL | Status: DC
  Administered 2020-06-16 – 2020-06-17 (×2): 100 mg via ORAL
  Filled 2020-06-16 (×2): qty 1

## 2020-06-16 MED ORDER — MEPERIDINE HCL 25 MG/ML IJ SOLN: 6.2500 mg | INTRAMUSCULAR | Status: DC | PRN

## 2020-06-16 MED ORDER — PHENYLEPHRINE 40 MCG/ML (10ML) SYRINGE FOR IV PUSH (FOR BLOOD PRESSURE SUPPORT)
PREFILLED_SYRINGE | INTRAVENOUS | Status: AC
  Filled 2020-06-16: qty 10

## 2020-06-16 MED ORDER — 0.9 % SODIUM CHLORIDE (POUR BTL) OPTIME
TOPICAL | Status: DC | PRN
  Administered 2020-06-16 (×6): 1000 mL

## 2020-06-16 MED ORDER — DEXAMETHASONE SODIUM PHOSPHATE 10 MG/ML IJ SOLN
INTRAMUSCULAR | Status: DC | PRN
  Administered 2020-06-16: 5 mg via INTRAVENOUS

## 2020-06-16 MED ORDER — VANCOMYCIN HCL 1000 MG/200ML IV SOLN
1000.0000 mg | Freq: Two times a day (BID) | INTRAVENOUS | Status: AC
  Administered 2020-06-16: 1000 mg via INTRAVENOUS
  Filled 2020-06-16: qty 200

## 2020-06-16 MED ORDER — FENTANYL CITRATE (PF) 100 MCG/2ML IJ SOLN
50.0000 ug | Freq: Once | INTRAMUSCULAR | Status: DC
Start: 2020-06-16 — End: 2020-06-17
  Filled 2020-06-16: qty 2
  Filled 2020-06-16: qty 1

## 2020-06-16 MED ORDER — BUPIVACAINE-EPINEPHRINE (PF) 0.5% -1:200000 IJ SOLN
INTRAMUSCULAR | Status: DC | PRN
  Administered 2020-06-16 (×5): 3 mL via PERINEURAL

## 2020-06-16 MED ORDER — PHENYLEPHRINE 40 MCG/ML (10ML) SYRINGE FOR IV PUSH (FOR BLOOD PRESSURE SUPPORT)
PREFILLED_SYRINGE | INTRAVENOUS | Status: DC | PRN
  Administered 2020-06-16: 80 ug via INTRAVENOUS

## 2020-06-16 MED ORDER — KETOROLAC TROMETHAMINE 30 MG/ML IJ SOLN
30.0000 mg | Freq: Once | INTRAMUSCULAR | Status: AC | PRN
  Administered 2020-06-16: 30 mg via INTRAVENOUS

## 2020-06-16 MED ORDER — TRANEXAMIC ACID-NACL 1000-0.7 MG/100ML-% IV SOLN
1000.0000 mg | INTRAVENOUS | Status: AC
  Administered 2020-06-16: 1000 mg via INTRAVENOUS
  Filled 2020-06-16: qty 100

## 2020-06-16 MED ORDER — METHOCARBAMOL 500 MG PO TABS
ORAL_TABLET | ORAL | Status: AC
  Filled 2020-06-16: qty 1

## 2020-06-16 MED ORDER — MIDAZOLAM HCL 2 MG/2ML IJ SOLN
INTRAMUSCULAR | Status: AC
  Filled 2020-06-16: qty 2

## 2020-06-16 MED ORDER — METHOCARBAMOL 500 MG PO TABS
500.0000 mg | ORAL_TABLET | Freq: Four times a day (QID) | ORAL | Status: DC | PRN
  Administered 2020-06-16: 500 mg via ORAL

## 2020-06-16 MED ORDER — HYDROCODONE-ACETAMINOPHEN 5-325 MG PO TABS
1.0000 | ORAL_TABLET | ORAL | Status: DC | PRN
Start: 2020-06-16 — End: 2020-06-17
  Administered 2020-06-16 – 2020-06-17 (×2): 2 via ORAL
  Filled 2020-06-16 (×2): qty 2

## 2020-06-16 MED ORDER — HEMOSTATIC AGENTS (NO CHARGE) OPTIME
TOPICAL | Status: DC | PRN
  Administered 2020-06-16: 1 via TOPICAL

## 2020-06-16 MED ORDER — IRRISEPT - 450ML BOTTLE WITH 0.05% CHG IN STERILE WATER, USP 99.95% OPTIME
TOPICAL | Status: DC | PRN
  Administered 2020-06-16: 450 mL

## 2020-06-16 MED ORDER — CHLORHEXIDINE GLUCONATE 0.12 % MT SOLN: 15.0000 mL | Freq: Once | OROMUCOSAL | Status: AC

## 2020-06-16 MED ORDER — ONDANSETRON HCL 4 MG/2ML IJ SOLN: 4.0000 mg | Freq: Four times a day (QID) | INTRAMUSCULAR | Status: DC | PRN

## 2020-06-16 MED ORDER — PROPOFOL 10 MG/ML IV BOLUS
INTRAVENOUS | Status: DC | PRN
  Administered 2020-06-16: 140 mg via INTRAVENOUS

## 2020-06-16 MED ORDER — METHOCARBAMOL 1000 MG/10ML IJ SOLN
500.0000 mg | Freq: Four times a day (QID) | INTRAVENOUS | Status: DC | PRN
  Filled 2020-06-16: qty 5

## 2020-06-16 MED ORDER — ROCURONIUM BROMIDE 10 MG/ML (PF) SYRINGE
PREFILLED_SYRINGE | INTRAVENOUS | Status: AC
  Filled 2020-06-16: qty 10

## 2020-06-16 MED ORDER — ONDANSETRON HCL 4 MG PO TABS: 4.0000 mg | ORAL_TABLET | Freq: Four times a day (QID) | ORAL | Status: DC | PRN

## 2020-06-16 MED ORDER — ASPIRIN EC 81 MG PO TBEC
81.0000 mg | DELAYED_RELEASE_TABLET | Freq: Every day | ORAL | Status: DC
  Filled 2020-06-16: qty 1

## 2020-06-16 MED ORDER — ACETAMINOPHEN 325 MG PO TABS: 325.0000 mg | ORAL_TABLET | Freq: Four times a day (QID) | ORAL | Status: DC | PRN

## 2020-06-16 SURGICAL SUPPLY — 89 items
ADPR HD STD TPR HUM TI RVRS (Orthopedic Implant) ×1 IMPLANT
AID PSTN UNV HD RSTRNT DISP (MISCELLANEOUS) ×1
ALCOHOL 70% 16 OZ (MISCELLANEOUS) ×2 IMPLANT
APL PRP STRL LF DISP 70% ISPRP (MISCELLANEOUS) ×2
BIT DRILL QUICK REL 1/8 2PK SL (DRILL) IMPLANT
BLADE SAW SGTL 13X75X1.27 (BLADE) ×2 IMPLANT
CEMENT BONE R 1X40 (Cement) ×1 IMPLANT
CEMENT HV SMART SET (Cement) IMPLANT
CHLORAPREP W/TINT 26 (MISCELLANEOUS) ×4 IMPLANT
COOLER ICEMAN CLASSIC (MISCELLANEOUS) ×2 IMPLANT
COVER SURGICAL LIGHT HANDLE (MISCELLANEOUS) ×2 IMPLANT
COVER WAND RF STERILE (DRAPES) ×2 IMPLANT
DRAPE INCISE IOBAN 66X45 STRL (DRAPES) ×2 IMPLANT
DRAPE U-SHAPE 47X51 STRL (DRAPES) ×4 IMPLANT
DRESSING AQUACEL AG SP 3.5X10 (GAUZE/BANDAGES/DRESSINGS) IMPLANT
DRILL QUICK RELEASE 1/8 INCH (DRILL) ×2
DRSG AQUACEL AG ADV 3.5X10 (GAUZE/BANDAGES/DRESSINGS) ×2 IMPLANT
DRSG AQUACEL AG SP 3.5X10 (GAUZE/BANDAGES/DRESSINGS) ×2
ELECT BLADE 4.0 EZ CLEAN MEGAD (MISCELLANEOUS)
ELECT REM PT RETURN 9FT ADLT (ELECTROSURGICAL) ×2
ELECTRODE BLDE 4.0 EZ CLN MEGD (MISCELLANEOUS) IMPLANT
ELECTRODE REM PT RTRN 9FT ADLT (ELECTROSURGICAL) ×1 IMPLANT
GAUZE SPONGE 4X4 12PLY STRL LF (GAUZE/BANDAGES/DRESSINGS) ×2 IMPLANT
GLENOID MOD PE 3 PEG SZ 2 (Shoulder) ×1 IMPLANT
GLENOID MOD POST TM SZ2 (Post) ×1 IMPLANT
GLOVE ECLIPSE 7.0 STRL STRAW (GLOVE) ×2 IMPLANT
GLOVE ECLIPSE 8.0 STRL XLNG CF (GLOVE) ×2 IMPLANT
GLOVE SRG 8 PF TXTR STRL LF DI (GLOVE) ×1 IMPLANT
GLOVE SURG UNDER POLY LF SZ7 (GLOVE) ×2 IMPLANT
GLOVE SURG UNDER POLY LF SZ8 (GLOVE) ×2
GOWN STRL REUS W/ TWL LRG LVL3 (GOWN DISPOSABLE) ×2 IMPLANT
GOWN STRL REUS W/ TWL XL LVL3 (GOWN DISPOSABLE) ×1 IMPLANT
GOWN STRL REUS W/TWL LRG LVL3 (GOWN DISPOSABLE) ×4
GOWN STRL REUS W/TWL XL LVL3 (GOWN DISPOSABLE) ×2
GUIDE MODEL REV SHLD RT SZ2 (ORTHOPEDIC DISPOSABLE SUPPLIES) ×1 IMPLANT
HEAD HUMERAL BIPOLAR 43X21X42 (Miscellaneous) IMPLANT
HEAD HUMERAL COMP STD (Orthopedic Implant) IMPLANT
HUMERAL HEAD BIPOLAR 43X21X42 (Miscellaneous) ×2 IMPLANT
HUMERAL HEAD COMP STD (Orthopedic Implant) ×2 IMPLANT
HYDROGEN PEROXIDE 16OZ (MISCELLANEOUS) ×2 IMPLANT
JET LAVAGE IRRISEPT WOUND (IRRIGATION / IRRIGATOR) ×2
KIT BASIN OR (CUSTOM PROCEDURE TRAY) ×2 IMPLANT
KIT TURNOVER KIT B (KITS) ×2 IMPLANT
LAVAGE JET IRRISEPT WOUND (IRRIGATION / IRRIGATOR) ×1 IMPLANT
LOOP VESSEL MAXI BLUE (MISCELLANEOUS) ×2 IMPLANT
MANIFOLD NEPTUNE II (INSTRUMENTS) ×2 IMPLANT
NDL HYPO 25GX1X1/2 BEV (NEEDLE) IMPLANT
NDL SUT 6 .5 CRC .975X.05 MAYO (NEEDLE) ×1 IMPLANT
NEEDLE HYPO 25GX1X1/2 BEV (NEEDLE) IMPLANT
NEEDLE MAYO TAPER (NEEDLE) ×2
NOZZLE CEMENT SMALL (ORTHOPEDIC DISPOSABLE SUPPLIES) IMPLANT
NS IRRIG 1000ML POUR BTL (IV SOLUTION) ×2 IMPLANT
PACK SHOULDER (CUSTOM PROCEDURE TRAY) ×2 IMPLANT
PAD ARMBOARD 7.5X6 YLW CONV (MISCELLANEOUS) ×4 IMPLANT
PAD COLD SHLDR WRAP-ON (PAD) ×2 IMPLANT
PIN HUMERAL STMN 3.2MMX9IN (INSTRUMENTS) ×2 IMPLANT
RESTRAINT HEAD UNIVERSAL NS (MISCELLANEOUS) ×2 IMPLANT
RETRIEVER SUT HEWSON (MISCELLANEOUS) ×2 IMPLANT
SLING ARM IMMOBILIZER LRG (SOFTGOODS) ×2 IMPLANT
SOL PREP POV-IOD 4OZ 10% (MISCELLANEOUS) ×2 IMPLANT
SPONGE LAP 18X18 RF (DISPOSABLE) ×2 IMPLANT
SPONGE LAP 18X18 X RAY DECT (DISPOSABLE) ×1 IMPLANT
STEM HUMERAL STRL 12MMX83MM (Stem) ×1 IMPLANT
STRIP CLOSURE SKIN 1/2X4 (GAUZE/BANDAGES/DRESSINGS) ×2 IMPLANT
SUCTION FRAZIER HANDLE 10FR (MISCELLANEOUS) ×2
SUCTION TUBE FRAZIER 10FR DISP (MISCELLANEOUS) ×1 IMPLANT
SUT BROADBAND TAPE 2PK 1.5 (SUTURE) ×3 IMPLANT
SUT FIBERWIRE #2 38 T-5 BLUE (SUTURE)
SUT MAXBRAID (SUTURE) IMPLANT
SUT MNCRL AB 3-0 PS2 18 (SUTURE) ×2 IMPLANT
SUT SILK 2 0 TIES 10X30 (SUTURE) ×2 IMPLANT
SUT VIC AB 0 CT1 27 (SUTURE) ×4
SUT VIC AB 0 CT1 27XBRD ANBCTR (SUTURE) ×2 IMPLANT
SUT VIC AB 1 CT1 27 (SUTURE) ×2
SUT VIC AB 1 CT1 27XBRD ANBCTR (SUTURE) ×1 IMPLANT
SUT VIC AB 2-0 CT1 27 (SUTURE) ×4
SUT VIC AB 2-0 CT1 TAPERPNT 27 (SUTURE) ×2 IMPLANT
SUT VICRYL 0 UR6 27IN ABS (SUTURE) ×5 IMPLANT
SUTURE FIBERWR #2 38 T-5 BLUE (SUTURE) IMPLANT
SUTURE TAPE 1.3 40 TPR END (SUTURE) IMPLANT
SUTURETAPE 1.3 40 TPR END (SUTURE)
SYR 10ML LL (SYRINGE) ×1 IMPLANT
SYR CONTROL 10ML LL (SYRINGE) IMPLANT
TOWEL GREEN STERILE (TOWEL DISPOSABLE) ×2 IMPLANT
TOWEL GREEN STERILE FF (TOWEL DISPOSABLE) ×2 IMPLANT
TOWER CARTRIDGE SMART MIX (DISPOSABLE) ×1 IMPLANT
TRAY FOLEY W/BAG SLVR 14FR LF (SET/KITS/TRAYS/PACK) ×1 IMPLANT
WATER STERILE IRR 1000ML POUR (IV SOLUTION) ×2 IMPLANT
YANKAUER SUCT BULB TIP NO VENT (SUCTIONS) ×1 IMPLANT

## 2020-06-16 NOTE — Anesthesia Postprocedure Evaluation (Signed)
Anesthesia Post Note  Patient: Angela Villanueva  Procedure(s) Performed: RIGHT TOTAL SHOULDER ARTHROPLASTY (Right: Shoulder)     Patient location during evaluation: PACU Anesthesia Type: General Level of consciousness: awake and sedated Pain management: pain level controlled Vital Signs Assessment: post-procedure vital signs reviewed and stable Respiratory status: spontaneous breathing Cardiovascular status: stable Postop Assessment: no apparent nausea or vomiting Anesthetic complications: no   No notable events documented.  Last Vitals:  Vitals:   06/16/20 1620 06/16/20 1721  BP: 108/76 (!) 121/92  Pulse:  63  Resp: 10   Temp: (!) 36.3 C (!) 36.4 C  SpO2:  99%    Last Pain:  Vitals:   06/16/20 1721  TempSrc: Oral  PainSc:                  Huston Foley

## 2020-06-16 NOTE — Anesthesia Procedure Notes (Signed)
Anesthesia Regional Block: Interscalene brachial plexus block   Pre-Anesthetic Checklist: , timeout performed,  Correct Patient, Correct Site, Correct Laterality,  Correct Procedure, Correct Position, site marked,  Risks and benefits discussed,  Surgical consent,  Pre-op evaluation,  At surgeon's request and post-op pain management  Laterality: Upper and Right  Prep: chloraprep       Needles:  Injection technique: Single-shot  Needle Type: Echogenic Stimulator Needle     Needle Length: 9cm  Needle Gauge: 20   Needle insertion depth: 0.5 cm   Additional Needles:   Procedures:,,,, ultrasound used (permanent image in chart),,    Narrative:  Start time: 06/16/2020 10:20 AM End time: 06/16/2020 10:28 AM Injection made incrementally with aspirations every 5 mL.  Events: other event  Performed by: Personally  Anesthesiologist: Lyn Hollingshead, MD

## 2020-06-16 NOTE — Brief Op Note (Signed)
   06/16/2020  3:31 PM  PATIENT:  Angela Villanueva  65 y.o. female  PRE-OPERATIVE DIAGNOSIS:  right should osteoarthritis  POST-OPERATIVE DIAGNOSIS:  right should osteoarthritis  PROCEDURE:  Procedure(s): RIGHT TOTAL SHOULDER ARTHROPLASTY  SURGEON:  Surgeon(s): Marlou Sa, Tonna Corner, MD  ASSISTANT: Annie Main, PA  ANESTHESIA:   general  EBL: 100 ml    Total I/O In: 1000 [I.V.:1000] Out: 100 [Blood:100]  BLOOD ADMINISTERED: none  DRAINS: none   LOCAL MEDICATIONS USED:  none  SPECIMEN:  No Specimen  COUNTS:  YES  TOURNIQUET:  * No tourniquets in log *  DICTATION: .Other Dictation: Dictation Number [id number]  PLAN OF CARE: Admit for overnight observation  PATIENT DISPOSITION:  PACU - hemodynamically stable

## 2020-06-16 NOTE — H&P (Signed)
Angela Villanueva is an 65 y.o. female.   Chief Complaint: Right shoulder pain HPI: Angela Villanueva is a 65 year old patient with right shoulder pain.  She describes worsening shoulder pain.  She states she is "ready for surgery".  She has still been working.  Has end-stage arthritis affecting the shoulder.  Has night pain rest pain as well as pain which interferes with her activities of daily living.  Does have husband at home who can help her recover  Past Medical History:  Diagnosis Date   Allergy    Anemia    Arthritis    Diverticulitis    Gastric ulcer    GERD (gastroesophageal reflux disease)    Hepatitis    Hypertension     Past Surgical History:  Procedure Laterality Date   ABDOMINAL HYSTERECTOMY     EYE SURGERY     fibroid tumor removal     FOOT SURGERY     LAPAROSCOPIC GASTROTOMY W/ REPAIR OF ULCER     PARTIAL HYSTERECTOMY     TONSILLECTOMY     AS CHILD   TUBAL LIGATION      Family History  Problem Relation Age of Onset   COPD Mother    Cancer Mother    Other Mother        blood cloth   Diabetes Other    Social History:  reports that she quit smoking about 28 years ago. Her smoking use included cigarettes. She has never used smokeless tobacco. She reports that she does not drink alcohol and does not use drugs.  Allergies:  Allergies  Allergen Reactions   Ampicillin Anaphylaxis and Other (See Comments)    Has patient had a PCN reaction causing immediate rash, facial/tongue/throat swelling, SOB or lightheadedness with hypotension: yes Has patient had a PCN reaction causing severe rash involving mucus membranes or skin necrosis: no Has patient had a PCN reaction that required hospitalization no Has patient had a PCN reaction occurring within the last 10 years: no If all of the above answers are "NO", then may proceed with Cephalosporin use.    Penicillins Hives, Rash and Other (See Comments)    Has patient had a PCN reaction causing immediate rash,  facial/tongue/throat swelling, SOB or lightheadedness with hypotension: yes Has patient had a PCN reaction causing severe rash involving mucus membranes or skin necrosis: no Has patient had a PCN reaction that required hospitalization yes - reaction sent me to hospital Has patient had a PCN reaction occurring within the last 10 years: no If all of the above answers are "NO", then may proceed with Cephalosporin use.     Medications Prior to Admission  Medication Sig Dispense Refill   amLODipine (NORVASC) 5 MG tablet TAKE 1 TABLET BY MOUTH ONCE DAILY (Patient taking differently: Take 5 mg by mouth daily.) 90 tablet 0   cycloSPORINE (RESTASIS) 0.05 % ophthalmic emulsion Place 1 drop into both eyes twice a day (Patient taking differently: Place 1 drop into both eyes 2 (two) times daily.) 60 each 11   ferrous sulfate 325 (65 FE) MG tablet Take 325 mg by mouth daily with breakfast.     Multiple Vitamins-Minerals (MULTIVITAMIN WITH MINERALS) tablet Take 1 tablet by mouth daily. 50 +     Omeprazole-Sodium Bicarbonate (ZEGERID) 20-1100 MG CAPS capsule Take 1 capsule by mouth 2 (two) times daily with breakfast and lunch.     celecoxib (CELEBREX) 100 MG capsule Take 1 capsule (100 mg total) by mouth 2 (two) times daily. (Patient not taking:  Reported on 06/10/2020) 60 capsule 1   celecoxib (CELEBREX) 100 MG capsule TAKE 1 CAPSULE BY MOUTH TWICE DAILY (Patient not taking: Reported on 06/10/2020) 60 capsule 1   diclofenac (CATAFLAM) 50 MG tablet TAKE 1 TABLET BY MOUTH 2 TIMES DAILY AS NEEDED FOR SHOULDER PAIN (Patient not taking: No sig reported) 60 tablet 0   diclofenac (CATAFLAM) 50 MG tablet TAKE 1 TABLET BY MOUTH TWO TIMES DAILY AS NEEDED FOR SHOULDER PAIN (Patient not taking: Reported on 06/10/2020) 60 tablet 0   diclofenac Sodium (VOLTAREN) 1 % GEL APPLY TO THE AFFECTED AREA(S) FOUR TIMES DAILY (Patient not taking: Reported on 06/10/2020) 500 g 2   famotidine (PEPCID) 20 MG tablet TAKE 1 TABLET BY MOUTH TWICE A  DAY REPLACES OMEPRAZOLE. (Patient not taking: No sig reported) 60 tablet 2   meloxicam (MOBIC) 7.5 MG tablet TAKE 1 TABLET BY MOUTH ONCE DAILY WITH FOOD (Patient not taking: Reported on 06/10/2020) 30 tablet 0   omeprazole (PRILOSEC) 40 MG capsule TAKE 1 CAPSULE BY MOUTH ONCE A DAY (Patient not taking: No sig reported) 90 capsule 0   Phenylephrine-Acetaminophen (VICKS DAYQUIL SINUS PO) Take 2 capsules by mouth daily as needed (for cold).     rosuvastatin (CRESTOR) 5 MG tablet TAKE 1 TABLET BY MOUTH ONCE DAILY AT BEDTIME (Patient not taking: No sig reported) 90 tablet 0   Zoster Vaccine Adjuvanted (SHINGRIX) injection TO BE ADMINISTERED BY PHARMACIST (Patient taking differently: TO BE ADMINISTERED BY PHARMACIST) 1 each 1    No results found for this or any previous visit (from the past 32 hour(s)). No results found.  Review of Systems  Musculoskeletal:  Positive for arthralgias.  All other systems reviewed and are negative.  Blood pressure 113/63, pulse (!) 56, temperature 98 F (36.7 C), temperature source Oral, resp. rate 14, height 5\' 4"  (1.626 m), weight 45.5 kg, SpO2 100 %. Physical Exam Vitals reviewed.  HENT:     Head: Normocephalic.     Nose: Nose normal.     Mouth/Throat:     Mouth: Mucous membranes are moist.  Eyes:     Pupils: Pupils are equal, round, and reactive to light.  Cardiovascular:     Rate and Rhythm: Normal rate.     Pulses: Normal pulses.  Pulmonary:     Effort: Pulmonary effort is normal.  Abdominal:     General: Abdomen is flat.  Musculoskeletal:     Cervical back: Normal range of motion.  Skin:    General: Skin is warm.     Capillary Refill: Capillary refill takes less than 2 seconds.  Neurological:     General: No focal deficit present.     Mental Status: She is alert.  Psychiatric:        Mood and Affect: Mood normal.     Ortho exam demonstrates full active and passive range of motion of the cervical spine.  She has good rotator cuff strength on  the right infraspinatus supraspinatus and subscap muscle testing.  Passive range of motion on the right is 50/90/160.  She does have some coarseness consistent with bone-on-bone arthritis and not rotator cuff pathology.  Motor or sensory function to the hand is intact.  Deltoid is functional Assessment/Plan  Impression is primary osteoarthritis of the right shoulder in a 65 year old patient with well-maintained motion and good rotator cuff strength.   thin cut CT scan demonstrates end-stage arthritis with maintenance of acromiohumeral distance and no rotator cuff atrophy.   at this time I would be  inclined to go with anatomic shoulder replacement with convertible glenoid if augmented glenoid not required.  Risk and benefits of the surgery are discussed with the patient including but not limited to infection nerve vessel damage incomplete functional restoration as well as possible revision surgery in her lifetime.  Her work does not involve particularly heavy lifting.  Anticipate out of work time around 3 months.  All questions answered Anderson Malta, MD 06/16/2020, 10:37 AM

## 2020-06-16 NOTE — Anesthesia Procedure Notes (Addendum)
Procedure Name: Intubation Date/Time: 06/16/2020 11:49 AM Performed by: Flossie Dibble, CRNA Pre-anesthesia Checklist: Patient identified, Patient being monitored, Timeout performed, Emergency Drugs available and Suction available Patient Re-evaluated:Patient Re-evaluated prior to induction Oxygen Delivery Method: Circle System Utilized Preoxygenation: Pre-oxygenation with 100% oxygen Induction Type: IV induction Ventilation: Mask ventilation without difficulty Laryngoscope Size: Mac and 3 Grade View: Grade I Tube type: Oral Tube size: 7.0 mm Number of attempts: 1 Airway Equipment and Method: stylet Placement Confirmation: ETT inserted through vocal cords under direct vision, positive ETCO2 and breath sounds checked- equal and bilateral Secured at: 20 cm Tube secured with: Tape Dental Injury: Teeth and Oropharynx as per pre-operative assessment

## 2020-06-16 NOTE — Transfer of Care (Signed)
Immediate Anesthesia Transfer of Care Note  Patient: Angela Villanueva  Procedure(s) Performed: RIGHT TOTAL SHOULDER ARTHROPLASTY (Right: Shoulder)  Patient Location: PACU  Anesthesia Type:GA combined with regional for post-op pain  Level of Consciousness: awake, alert  and oriented  Airway & Oxygen Therapy: Patient Spontanous Breathing  Post-op Assessment: Report given to RN and Post -op Vital signs reviewed and stable  Post vital signs: Reviewed and stable  Last Vitals:  Vitals Value Taken Time  BP 109/76 06/16/20 1518  Temp 36.4 C 06/16/20 1520  Pulse 70 06/16/20 1520  Resp 15 06/16/20 1520  SpO2 97 % 06/16/20 1520  Vitals shown include unvalidated device data.  Last Pain:  Vitals:   06/16/20 0928  TempSrc:   PainSc: 6          Complications: No notable events documented.

## 2020-06-17 ENCOUNTER — Telehealth: Payer: Self-pay | Admitting: Orthopedic Surgery

## 2020-06-17 ENCOUNTER — Encounter (HOSPITAL_COMMUNITY): Payer: Self-pay | Admitting: Orthopedic Surgery

## 2020-06-17 ENCOUNTER — Other Ambulatory Visit (HOSPITAL_COMMUNITY): Payer: Self-pay

## 2020-06-17 DIAGNOSIS — M19011 Primary osteoarthritis, right shoulder: Secondary | ICD-10-CM | POA: Diagnosis not present

## 2020-06-17 MED ORDER — SIMETHICONE 40 MG/0.6ML PO SUSP
80.0000 mg | Freq: Four times a day (QID) | ORAL | Status: DC | PRN
  Administered 2020-06-17: 80 mg via ORAL
  Filled 2020-06-17 (×2): qty 1.2

## 2020-06-17 MED ORDER — METHOCARBAMOL 500 MG PO TABS
500.0000 mg | ORAL_TABLET | Freq: Three times a day (TID) | ORAL | 0 refills | Status: DC | PRN
  Filled 2020-06-17: qty 30, 10d supply, fill #0

## 2020-06-17 MED ORDER — ASPIRIN 81 MG PO TBEC
81.0000 mg | DELAYED_RELEASE_TABLET | Freq: Every day | ORAL | 0 refills | Status: DC
  Filled 2020-06-17: qty 30, 30d supply, fill #0

## 2020-06-17 MED ORDER — HYDROCODONE-ACETAMINOPHEN 5-325 MG PO TABS
1.0000 | ORAL_TABLET | ORAL | 0 refills | Status: DC | PRN
  Filled 2020-06-17: qty 30, 3d supply, fill #0

## 2020-06-17 MED ORDER — SIMETHICONE 80 MG PO CHEW: 80.0000 mg | CHEWABLE_TABLET | Freq: Four times a day (QID) | ORAL | Status: DC | PRN

## 2020-06-17 NOTE — Progress Notes (Signed)
Physical Therapy Evaluation Patient Details Name: Angela Villanueva MRN: [id number] DOB: December 23, 1955 Today's Date: 06/17/2020   History of Present Illness  Pt is a 65 y.o. female who presented 6/14 with R shoulder pain and end-stage arthritis affecting the shoulder. S/p R total shoulder arthroplasty. PMH: anemia, arthritis, diverticulitis, GERD, hepatitis, and HTN.   Clinical Impression  Pt presents with condition above and deficits mentioned below, see PT Problem List. 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 Currently, pt displays limitations in R UE strength, coordination, and ROM. Initially, pt was slightly unsteady with mobility, likely due to feeling lightheaded. However, as distance progressed and lightheadedness decreased, her balance and gait pattern improved. Pt is not at risk for falls, supported by her DGI score of 21 this date. Reviewed R shoulder precautions with pt verbalizing understanding. Recommend follow surgeon's recs for follow-up PT services at d/c based on progression of R UE ROM, coordination, stability, and strength. Will continue to follow acutely.    Follow Up Recommendations Follow surgeon's recommendation for DC plan and follow-up therapies    Equipment Recommendations  None recommended by PT    Recommendations for Other Services       Precautions / Restrictions Precautions Precaution Comments: AROM elbow, wrist, and hand; No AROM shoulder; OK for pendulums; external rotation 0-30; sling all times except ADLs/exercise Required Braces or Orthoses: Sling Restrictions Weight Bearing Restrictions: Yes RUE Weight Bearing: Non weight bearing Other Position/Activity Restrictions: No shoulder AROM      Mobility  Bed Mobility               General bed mobility comments: Pt sitting up in recliner upon arrival.    Transfers Overall transfer level: Needs assistance Equipment used: None Transfers: Sit to/from  Stand Sit to Stand: Supervision         General transfer comment: Supervision for safety, no LOB.  Ambulation/Gait Ambulation/Gait assistance: Min guard;Supervision Gait Distance (Feet): 250 Feet Assistive device: None Gait Pattern/deviations: Step-through pattern;Decreased stride length;Drifts right/left Gait velocity: WFL Gait velocity interpretation: >4.37 ft/sec, indicative of normal walking speed (when cued to increase speed) General Gait Details: Pt initially reporting feeling a little lightheaded, stating it was from not being up and walking in a day. Pt with slight drift bil until lightheadedness resolved in which her speed, bil step length, and steadiness then improved. No overt LOB, min guard-supervision for safety.  Stairs Stairs: Yes Stairs assistance: Supervision Stair Management: One rail Left;Alternating pattern;Forwards Number of Stairs: 6 General stair comments: Ascends and descends with L rail and reciprocal pattern, no LOB. Supervision for safety.  Wheelchair Mobility    Modified Rankin (Stroke Patients Only)       Balance Overall balance assessment: Mild deficits observed, not formally tested                               Standardized Balance Assessment Standardized Balance Assessment : Dynamic Gait Index   Dynamic Gait Index Level Surface: Mild Impairment Change in Gait Speed: Normal Gait with Horizontal Head Turns: Normal Gait with Vertical Head Turns: Normal Gait and Pivot Turn: Normal Step Over Obstacle: Mild Impairment Step Around Obstacles: Normal Steps: Mild Impairment Total Score: 21       Pertinent Vitals/Pain Pain Assessment: Faces Faces Pain Scale: Hurts little more Pain Location: R shoulder Pain Descriptors / Indicators: Discomfort;Grimacing;Guarding Pain Intervention(s): Limited activity within patient's tolerance;Repositioned;Monitored during session  Home Living Family/patient expects to be discharged to::  Private residence Living Arrangements: Spouse/significant other Available Help at Discharge: Family;Available 24 hours/day Type of Home: House Home Access: Stairs to enter Entrance Stairs-Rails: Psychiatric nurse of Steps: 3 Home Layout: One level Home Equipment: Grab bars - tub/shower;Grab bars - toilet      Prior Function Level of Independence: Independent         Comments: 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     Hand Dominance        Extremity/Trunk Assessment   Upper Extremity Assessment Upper Extremity Assessment: RUE deficits/detail RUE Deficits / Details: s/p R total shoulder arthroplasty with decreased generalized strength noted    Lower Extremity Assessment Lower Extremity Assessment: Overall WFL for tasks assessed    Cervical / Trunk Assessment Cervical / Trunk Assessment: Normal  Communication   Communication: No difficulties  Cognition Arousal/Alertness: Awake/alert Behavior During Therapy: WFL for tasks assessed/performed Overall Cognitive Status: Within Functional Limits for tasks assessed                                        General Comments      Exercises     Assessment/Plan    PT Assessment Patient needs continued PT services  PT Problem List Decreased strength;Decreased range of motion;Decreased balance;Decreased coordination;Decreased mobility;Decreased knowledge of precautions;Pain       PT Treatment Interventions DME instruction;Gait training;Stair training;Functional mobility training;Therapeutic activities;Therapeutic exercise;Balance training;Neuromuscular re-education;Patient/family education    PT Goals (Current goals can be found in the Care Plan section)  Acute Rehab PT Goals Patient Stated Goal: to rest and eventually return to work PT Goal Formulation: With patient Time For Goal Achievement: 06/24/20 Potential to Achieve Goals: Good    Frequency 7X/week   Barriers to  discharge        Co-evaluation               AM-PAC PT "6 Clicks" Mobility  Outcome Measure Help needed turning from your back to your side while in a flat bed without using bedrails?: A Little Help needed moving from lying on your back to sitting on the side of a flat bed without using bedrails?: A Little Help needed moving to and from a bed to a chair (including a wheelchair)?: A Little Help needed standing up from a chair using your arms (e.g., wheelchair or bedside chair)?: A Little Help needed to walk in hospital room?: A Little Help needed climbing 3-5 steps with a railing? : A Little 6 Click Score: 18    End of Session Equipment Utilized During Treatment: Gait belt;Other (comment) (sling) Activity Tolerance: Patient tolerated treatment well Patient left: in chair;with call bell/phone within reach;with nursing/sitter in room Nurse Communication: Mobility status PT Visit Diagnosis: Unsteadiness on feet (R26.81);Muscle weakness (generalized) (M62.81);Pain Pain - Right/Left: Left Pain - part of body: Shoulder    Time: 8592-9244 PT Time Calculation (min) (ACUTE ONLY): 11 min   Charges:   PT Evaluation $PT Eval Low Complexity: 1 Low          Moishe Spice, PT, DPT Acute Rehabilitation Services  Pager: [phone] Office: [phone]   Orvan Falconer 06/17/2020, 12:07 PM

## 2020-06-17 NOTE — Discharge Planning (Signed)
Patient refused Aspirin this morning due to prior issues with stomach. Daralene Milch PA informed and says its ok for her not to take now and after discharge.

## 2020-06-17 NOTE — Op Note (Signed)
NAME: BIANNEY, ROCKWOOD MEDICAL RECORD NO: [id number] ACCOUNT NO: [id number] DATE OF BIRTH: [birth year] FACILITY: [facility] LOCATION: [facility] PHYSICIAN: Yetta Barre. Marlou Sa, MD  Operative Report   DATE OF PROCEDURE: 06/16/2020  PREOPERATIVE DIAGNOSIS:  Right shoulder arthritis.  POSTOPERATIVE DIAGNOSIS:  Right shoulder arthritis.  PROCEDURE:  Right shoulder replacement.  SURGEON:  Attending Meredith Pel, MD  ASSISTANT:  Annie Main, PA  IMPLANTS UTILIZED:  Include [employer] modular 3 peg glenoid with trabecular metal peg and cemented 2 pegs size 2 with 12 mm x 83 mm humeral stem with modular head 42 x 21, with 43 mm of curvature.  INDICATIONS:  The patient is a 65 year old patient with end-stage right shoulder arthritis, who presents for operative management after explanation of risks and benefits.  DESCRIPTION OF PROCEDURE:  The patient was brought to the operating room where general endotracheal anesthesia was induced.  Preoperative antibiotics were administered.  Timeout was called.  The patient was placed in the beach chair position with the  head in neutral position.  Right arm was examined under anesthesia.  Found to have about 60 degrees of external rotation.  Forward flexion was about 90.  Isolated glenohumeral abduction was 90, forward flexion was about 150.  The patient's head was in  neutral position.  Right shoulder, arm and hand prescrubbed with hydrogen peroxide, alcohol and Betadine, was allowed to air dry, then prepped with ChloraPrep solution and draped in a sterile manner.  Ioban used to cover the operative field.  Timeout was  called.  Deltopectoral approach was made.  Skin and subcutaneous tissue were sharply divided.  Cephalic vein mobilized laterally.  The interval between the deltoid and the pectoralis was developed. Subacromial and subdeltoid adhesions were released.   Manually, the anterior portion of the deltoid was also released.  Biceps tendon was tenodesed  to the pec tendon.  This was done with three 0 Vicryl sutures.  The rotator interval was then opened up along the course of the biceps tendon above the  subscapularis.  Circumflex vessels were ligated.  Axillary nerve identified and a vessel loop placed around it.  Protected at all times during the case.  Next, the subscapularis was detached using a 15 blade around to the 5 o'clock position on the  humeral head.  Significant arthritis was present within the glenohumeral joint.  Rotator cuff was intact.  Subscap was tagged.  Capsule was released inferiorly off of about 2 cm from the inferior humeral neck.  At this time, the head was dislocated.   Retractors placed.  Proximal reaming was performed up to size 12.  The head was cut in about 30 degrees of retroversion, which was just anterior to the lesser tuberosity medial border and went back to the rotator cuff insertion.  Broaching then performed  to match this version up to size 12.  Cap was placed.  Attention was then directed towards the glenoid.  Labrum was removed from the 12 o'clock to 6 o'clock position.  Biceps tendon was released.  Coracohumeral ligament was released and the  subscapularis was also released back to the base of the coracoid.  Good excursion was obtained on the subscapularis.  Next, the guide was placed on the glenoid.  Reaming was then performed in accordance with preoperative templating and planning.  The  central peg was then reamed and a 3 peg trial was placed.  The humeral head trial was then also placed onto the stem and the patient had good stability with 50%  translation posterior, inferior and no instability.  With the arm in neutral position, the  humeral head pointed towards the glenoid.  Next, cap was placed back onto the humerus and trial component was removed from the glenoid.  Vault and peg holes were enclosed in bone.  Thorough irrigation performed of all these holes and Surgicel placed into  the superior and inferior  cemented holes.  Next, the glenoid baseplate was placed in good position and alignment.  Excellent press fit obtained and excellent cemented fit also obtained after putting cement in the holes.  The cement was allowed to  harden.  Next, attention was directed towards determining the optimal size for the humeral head as well as offset.  The 42 x 21 mm head gave a very good coverage and also very good soft tissue tension and stability.  Trial humeral component was removed.   True component was placed.  Six SutureTapes were placed for subscap repair.  These were placed prior to placement of the humeral stem.  Next, the true head was placed after placement of the stem and same stability parameters were maintained.  The  patient had 50% posterior translation with excellent spring back along with 50% inferior translation and very good stability.  The patient had internal rotation parallel to the trunk.  At this time, IrriSept solution which had been utilized after the  incision as well as after the arthrotomy was also utilized.  True components placed.  Humeral head height approximately equal to the top of the tuberosity.  No impingement on the rotator cuff.  The subscap was then repaired with the arm in about 30  degrees of external rotation using the 6 SutureTapes.  Nice knots were utilized and a Psychologist, occupational was achieved.  The rotator interval was closed after irrigating with IrriSept solution and placing vancomycin powder into the joint.  The vancomycin  powder was also placed into the glenoid vault as well as the humeral shaft prior to implant placement.  The deltopectoral interval was then closed using #1 Vicryl suture followed by interrupted inverted 2-0 Vicryl suture and 3-0 Monocryl along with  Steri-Strips and Aquacel dressing and shoulder immobilizer.  The patient tolerated the procedure well without immediate complications and was transferred to the recovery room in stable condition.  Luke's assistance  was required at all times during the  case for retraction, mobilization of tissue, opening and closing.  His assistance was a medical necessity.   SHW D: 06/16/2020 3:39:50 pm T: 06/16/2020 10:26:00 pm  JOB: [id number]/ [id number]

## 2020-06-17 NOTE — Progress Notes (Signed)
  Subjective: Angela Villanueva is a 65 y.o. female s/p right total shoulder arthroplasty.  They are POD 1.  Pt's pain is controlled.  Block still in effect.  Patient cannot lift her arm at this time.  She has minimal pain.  She is ready for discharge home.  Objective: Vital signs in last 24 hours: Temp:  [97.3 F (36.3 C)-98.2 F (36.8 C)] 97.9 F (36.6 C) (06/15 0747) Pulse Rate:  [61-72] 65 (06/15 0747) Resp:  [10-17] 14 (06/15 0747) BP: (100-121)/(63-92) 100/63 (06/15 0747) SpO2:  [95 %-100 %] 100 % (06/15 0747)  Intake/Output from previous day: 06/14 0701 - 06/15 0700 In: 1000 [I.V.:1000] Out: 550 [Urine:450; Blood:100] Intake/Output this shift: Total I/O In: 1409.6 [P.O.:240; I.V.:1169.6] Out: 1 [Urine:1]  Exam:  No gross blood or drainage overlying the dressing 2+ radial pulse Sensation intact distally in the right hand Unable to extend the right wrist or move her fingers without some slight flexion extension of the fingertips.  Deltoid not firing at this time.  Block still in effect.   Labs: No results for input(s): HGB in the last 72 hours. No results for input(s): WBC, RBC, HCT, PLT in the last 72 hours. No results for input(s): NA, K, CL, CO2, BUN, CREATININE, GLUCOSE, CALCIUM in the last 72 hours. No results for input(s): LABPT, INR in the last 72 hours.  Assessment/Plan: Pt is POD 1 s/p right TSA    -Plan to discharge to home today  -No lifting with the operative arm  -Follow-up with Dr. Marlou Sa on 06/29/2020.  She has CPM machine at home.     [name] 06/17/2020, 2:39 PM

## 2020-06-17 NOTE — Progress Notes (Signed)
Occupational Therapy Evaluation Patient Details Name: Angela Villanueva MRN: [id number] DOB: 07/07/1955 Today's Date: 06/17/2020    History of Present Illness Pt is a 65 y.o. female who presented 6/14 with R shoulder pain and end-stage arthritis affecting the shoulder. S/p R total shoulder arthroplasty. PMH: anemia, arthritis, diverticulitis, GERD, hepatitis, and HTN.   Clinical Impression   Angela Villanueva was evaluated s/p the above R shoulder replacement. 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 She lives in a 1 level home with 3 STE with her husband who is able to assist 24/7 at dc. Pt demonstrated great understanding of all d/c shoulder exercises, compensatory techniques for ADLs and sling wear schedule; handouts left in room with pt. Pt requires mod A for upper body dressing to manage RUE. She is supervision for all ambulation and toilet transfer. Pt does not require OT acutely. Recommend pt follow surgeons discharge plan for therapies.     Follow Up Recommendations  Follow surgeon's recommendation for DC plan and follow-up therapies;Supervision - Intermittent    Equipment Recommendations  Tub/shower bench       Precautions / Restrictions Precautions Precautions: Shoulder Shoulder Interventions: Shoulder sling/immobilizer;At all times;Off for dressing/bathing/exercises Precaution Booklet Issued: Yes (comment) Precaution Comments: AROM elbow, wrist, and hand; No AROM shoulder; OK for pendulums; external rotation 0-30; sling all times except ADLs/exercise Required Braces or Orthoses: Sling Restrictions Weight Bearing Restrictions: Yes RUE Weight Bearing: Non weight bearing Other Position/Activity Restrictions: No shoulder AROM      Mobility Bed Mobility Overal bed mobility: Modified Independent             General bed mobility comments: EOB elevated    Transfers Overall transfer level: Needs assistance Equipment used: None Transfers: Sit  to/from Stand Sit to Stand: Supervision         General transfer comment: Supervision for safety, no LOB.    Balance Overall balance assessment: Mild deficits observed, not formally tested         Standardized Balance Assessment Standardized Balance Assessment : Dynamic Gait Index   Dynamic Gait Index Level Surface: Mild Impairment Change in Gait Speed: Normal Gait with Horizontal Head Turns: Normal Gait with Vertical Head Turns: Normal Gait and Pivot Turn: Normal Step Over Obstacle: Mild Impairment Step Around Obstacles: Normal Steps: Mild Impairment Total Score: 21     ADL either performed or assessed with clinical judgement   ADL Overall ADL's : Needs assistance/impaired Eating/Feeding: Set up;Sitting   Grooming: Wash/dry hands;Wash/dry face;Oral care;Applying deodorant;Set up;Cueing for compensatory techniques;Sitting;Standing   Upper Body Bathing: Minimal assistance;Cueing for compensatory techniques;Sitting;Cueing for UE precautions   Lower Body Bathing: Minimal assistance;Cueing for compensatory techniques;Sit to/from stand   Upper Body Dressing : Moderate assistance;Cueing for compensatory techniques;Cueing for UE precautions;Sitting   Lower Body Dressing: Minimal assistance;Sit to/from stand   Toilet Transfer: Min guard;Ambulation   Toileting- Clothing Manipulation and Hygiene: Supervision/safety;Sit to/from stand       Functional mobility during ADLs: Supervision/safety General ADL Comments: assist levels fro compensatory techniques, pt demonstrated great understanding - handouts provided     Vision Patient Visual Report: No change from baseline Vision Assessment?: No apparent visual deficits            Pertinent Vitals/Pain Pain Assessment: Faces Faces Pain Scale: Hurts a little bit Pain Location: R shoulder Pain Descriptors / Indicators: Discomfort;Grimacing;Guarding Pain Intervention(s): Limited activity within patient's  tolerance;Monitored during session;Patient requesting pain meds-RN notified     Hand Dominance  Extremity/Trunk Assessment Upper Extremity Assessment Upper Extremity Assessment: RUE deficits/detail RUE Deficits / Details: s/p R total shoulder arthroplasty with decreased generalized strength noted (Pt PROM of hand, wrist and elbow are WFL, slight edema noted in hand) RUE: Unable to fully assess due to immobilization (Pt still experiencing nerve block) RUE Sensation: decreased light touch RUE Coordination: decreased fine motor;decreased gross motor   Lower Extremity Assessment Lower Extremity Assessment: Overall WFL for tasks assessed   Cervical / Trunk Assessment Cervical / Trunk Assessment: Normal   Communication Communication Communication: No difficulties   Cognition Arousal/Alertness: Awake/alert Behavior During Therapy: WFL for tasks assessed/performed Overall Cognitive Status: Within Functional Limits for tasks assessed             General Comments  pt still experiencing nerve block, some pin and needles sensation in hand, pain medication requested    Exercises Exercises: Shoulder Shoulder Exercises Pendulum Exercise: 5 reps;Right Elbow Flexion: PROM;10 reps;Right;Seated Elbow Extension: PROM;Right;10 reps;Seated Wrist Flexion: PROM;Right;10 reps;Seated Wrist Extension: PROM;Right;10 reps;Seated Digit Composite Flexion: PROM;Right;10 reps;Seated Composite Extension: PROM;10 reps;Right;Seated   Shoulder Instructions Shoulder Instructions Donning/doffing shirt without moving shoulder: Moderate assistance Method for sponge bathing under operated UE: Minimal assistance Donning/doffing sling/immobilizer: Moderate assistance;Patient able to independently direct caregiver Correct positioning of sling/immobilizer: Patient able to independently direct caregiver Pendulum exercises (written home exercise program): Min-guard ROM for elbow, wrist and digits of operated  UE: Patient able to independently direct caregiver Sling wearing schedule (on at all times/off for ADL's): Independent Proper positioning of operated UE when showering: Independent    Home Living Family/patient expects to be discharged to:: Private residence Living Arrangements: Spouse/significant other Available Help at Discharge: Family;Available 24 hours/day Type of Home: House Home Access: Stairs to enter [employer] of Steps: 3 Entrance Stairs-Rails: Right;Left Home Layout: One level     Bathroom Shower/Tub: Teacher, early years/pre: Standard     Home Equipment: Grab bars - tub/shower;Grab bars - toilet          Prior Functioning/Environment Level of Independence: Independent        Comments: 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        OT Problem List: Decreased strength;Decreased range of motion;Decreased knowledge of use of DME or AE;Decreased knowledge of precautions;Pain;Impaired UE functional use      OT Treatment/Interventions:      OT Goals(Current goals can be found in the care plan section) Acute Rehab OT Goals Patient Stated Goal: to go home soon OT Goal Formulation: With patient      AM-PAC OT "6 Clicks" Daily Activity     Outcome Measure Help from another person eating meals?: A Little Help from another person taking care of personal grooming?: A Little Help from another person toileting, which includes using toliet, bedpan, or urinal?: A Little Help from another person bathing (including washing, rinsing, drying)?: A Little Help from another person to put on and taking off regular upper body clothing?: A Lot Help from another person to put on and taking off regular lower body clothing?: A Little 6 Click Score: 17   End of Session Equipment Utilized During Treatment: Other (comment) (sling) Nurse Communication: Mobility status  Activity Tolerance: Patient tolerated treatment well Patient left: in chair;with  call bell/phone within reach  OT Visit Diagnosis: Pain Pain - Right/Left: Right Pain - part of body: Shoulder                Time: 4098-1191 OT Time Calculation (min):  35 min Charges:  OT General Charges $OT Visit: 1 Visit OT Evaluation $OT Eval Moderate Complexity: 1 Mod OT Treatments $Self Care/Home Management : 8-22 mins    Angela Villanueva 06/17/2020, 12:18 PM

## 2020-06-17 NOTE — Discharge Planning (Signed)
Discharge instructions given to patient and spouse. Patient with no questions at this time. PIV d/c'd intact.Patient discharged to car via [facility].

## 2020-06-17 NOTE — Discharge Summary (Signed)
Physician Discharge Summary      Patient ID: Angela Villanueva MRN: [id number] DOB/AGE: 1955-05-12 65 y.o.  Admit date: 06/16/2020 Discharge date: 06/17/20  Admission Diagnoses:  Active Problems:   S/P shoulder replacement, right   Discharge Diagnoses:  Same  Surgeries: Procedure(s): RIGHT TOTAL SHOULDER ARTHROPLASTY on 06/16/2020   Consultants:   Discharged Condition: Stable  Hospital Course: Angela Villanueva is an 65 y.o. female who was admitted 06/16/2020 with a chief complaint of right shoulder pain, and found to have a diagnosis of right shoulder OA.  They were brought to the operating room on 06/16/2020 and underwent the above named procedures.  Pt awoke from anesthesia without complication and was transferred to the floor. On POD1, patient's pain was well-controlled with block still in effect. She had no difficulties mobilizing or any concerning complaints. She was discharged home on POD1.  Pt will f/u with Dr. Marlou Sa in clinic in ~2 weeks.   Antibiotics given:  Anti-infectives (From admission, onward)    Start     Dose/Rate Route Frequency Ordered Stop   06/16/20 2330  vancomycin (VANCOREADY) IVPB 1000 mg/200 mL        1,000 mg 200 mL/hr over 60 Minutes Intravenous Every 12 hours 06/16/20 1725 06/17/20 0030   06/16/20 1344  vancomycin (VANCOCIN) powder  Status:  Discontinued          As needed 06/16/20 1345 06/16/20 1513   06/16/20 0930  vancomycin (VANCOCIN) IVPB 1000 mg/200 mL premix        1,000 mg 200 mL/hr over 60 Minutes Intravenous On call to O.R. 06/16/20 3009 06/16/20 1104     .  Recent vital signs:  Vitals:   06/17/20 0600 06/17/20 0747  BP: 116/74 100/63  Pulse: 64 65  Resp: 16 14  Temp: 98.2 F (36.8 C) 97.9 F (36.6 C)  SpO2: 97% 100%    Recent laboratory studies:  Results for orders placed or performed during the hospital encounter of 06/12/20  Urine culture   Specimen: Urine, Clean Catch  Result Value Ref Range   Specimen Description  URINE, CLEAN CATCH    Special Requests NONE    Culture (A)     <10,000 COLONIES/mL INSIGNIFICANT GROWTH Performed at [facility] [address] [place], [place] [address]    Report Status 06/13/2020 FINAL   Surgical pcr screen   Specimen: Nasal Mucosa; Nasal Swab  Result Value Ref Range   MRSA, PCR NEGATIVE NEGATIVE   Staphylococcus aureus NEGATIVE NEGATIVE  SARS CORONAVIRUS 2 (TAT 6-24 HRS) Nasopharyngeal Nasopharyngeal Swab   Specimen: Nasopharyngeal Swab  Result Value Ref Range   SARS Coronavirus 2 NEGATIVE NEGATIVE  CBC  Result Value Ref Range   WBC 8.7 4.0 - 10.5 K/uL   RBC 4.60 3.87 - 5.11 MIL/uL   Hemoglobin 13.6 12.0 - 15.0 g/dL   HCT 41.5 36.0 - 46.0 %   MCV 90.2 80.0 - 100.0 fL   MCH 29.6 26.0 - 34.0 pg   MCHC 32.8 30.0 - 36.0 g/dL   RDW 11.9 11.5 - 15.5 %   Platelets 188 150 - 400 K/uL   nRBC 0.0 0.0 - 0.2 %  Basic metabolic panel  Result Value Ref Range   Sodium 136 135 - 145 mmol/L   Potassium 3.1 (L) 3.5 - 5.1 mmol/L   Chloride 96 (L) 98 - 111 mmol/L   CO2 32 22 - 32 mmol/L   Glucose, Bld 89 70 - 99 mg/dL   BUN 6 (  L) 8 - 23 mg/dL   Creatinine, Ser 0.73 0.44 - 1.00 mg/dL   Calcium 9.0 8.9 - 10.3 mg/dL   GFR, Estimated >60 >60 mL/min   Anion gap 8 5 - 15  Urinalysis, Routine w reflex microscopic  Result Value Ref Range   Color, Urine YELLOW YELLOW   APPearance CLEAR CLEAR   Specific Gravity, Urine 1.005 1.005 - 1.030   pH 7.0 5.0 - 8.0   Glucose, UA NEGATIVE NEGATIVE mg/dL   Hgb urine dipstick NEGATIVE NEGATIVE   Bilirubin Urine NEGATIVE NEGATIVE   Ketones, ur NEGATIVE NEGATIVE mg/dL   Protein, ur NEGATIVE NEGATIVE mg/dL   Nitrite NEGATIVE NEGATIVE   Leukocytes,Ua NEGATIVE NEGATIVE    Discharge Medications:   Allergies as of 06/17/2020       Reactions   Ampicillin Anaphylaxis, Other (See Comments)   Has patient had a PCN reaction causing immediate rash, facial/tongue/throat swelling, SOB or lightheadedness with hypotension: yes Has  patient had a PCN reaction causing severe rash involving mucus membranes or skin necrosis: no Has patient had a PCN reaction that required hospitalization no Has patient had a PCN reaction occurring within the last 10 years: no If all of the above answers are "NO", then may proceed with Cephalosporin use.   Penicillins Hives, Rash, Other (See Comments)   Has patient had a PCN reaction causing immediate rash, facial/tongue/throat swelling, SOB or lightheadedness with hypotension: yes Has patient had a PCN reaction causing severe rash involving mucus membranes or skin necrosis: no Has patient had a PCN reaction that required hospitalization yes - reaction sent me to hospital Has patient had a PCN reaction occurring within the last 10 years: no If all of the above answers are "NO", then may proceed with Cephalosporin use.        Medication List     STOP taking these medications    celecoxib 100 MG capsule Commonly known as: CELEBREX   diclofenac 50 MG tablet Commonly known as: CATAFLAM   diclofenac Sodium 1 % Gel Commonly known as: VOLTAREN   famotidine 20 MG tablet Commonly known as: PEPCID   meloxicam 7.5 MG tablet Commonly known as: MOBIC   omeprazole 40 MG capsule Commonly known as: PRILOSEC   rosuvastatin 5 MG tablet Commonly known as: CRESTOR       TAKE these medications    amLODipine 5 MG tablet Commonly known as: NORVASC TAKE 1 TABLET BY MOUTH ONCE DAILY What changed: how much to take   aspirin 81 MG EC tablet Take 1 tablet (81 mg total) by mouth daily. Swallow whole.   cycloSPORINE 0.05 % ophthalmic emulsion Commonly known as: RESTASIS Place 1 drop into both eyes twice a day (Place 1 drop into both eyes twice a day) What changed:  how much to take how to take this when to take this   ferrous sulfate 325 (65 FE) MG tablet Take 325 mg by mouth daily with breakfast.   HYDROcodone-acetaminophen 5-325 MG tablet Commonly known as: NORCO/VICODIN Take  1-2 tablets by mouth every 4 (four) hours as needed for moderate pain (pain score 4-6).   methocarbamol 500 MG tablet Commonly known as: ROBAXIN Take 1 tablet (500 mg total) by mouth every 8 (eight) hours as needed for muscle spasms.   multivitamin with minerals tablet Take 1 tablet by mouth daily. 50 +   Omeprazole-Sodium Bicarbonate 20-1100 MG Caps capsule Commonly known as: ZEGERID Take 1 capsule by mouth 2 (two) times daily with breakfast and lunch.  Shingrix injection Generic drug: Zoster Vaccine Adjuvanted TO BE ADMINISTERED BY PHARMACIST   VICKS DAYQUIL SINUS PO Take 2 capsules by mouth daily as needed (for cold).        Diagnostic Studies: DG Shoulder Right Port  Result Date: 06/16/2020 CLINICAL DATA:  Status post right shoulder surgery EXAM: PORTABLE RIGHT SHOULDER COMPARISON:  None. FINDINGS: Right shoulder prosthesis is now noted in satisfactory position. No acute bony or soft tissue abnormality is seen. IMPRESSION: Status post right shoulder replacement. Electronically Signed   By: Inez Catalina M.D.   On: 06/16/2020 15:40    Disposition: Discharge disposition: 01-Home or Self Care       Discharge Instructions     Call MD / Call 911   Complete by: As directed    If you experience chest pain or shortness of breath, CALL 911 and be transported to the hospital emergency room.  If you develope a fever above 101 F, pus (white drainage) or increased drainage or redness at the wound, or calf pain, call your surgeon's office.   Constipation Prevention   Complete by: As directed    Drink plenty of fluids.  Prune juice may be helpful.  You may use a stool softener, such as Colace (over the counter) 100 mg twice a day.  Use MiraLax (over the counter) for constipation as needed.   Diet - low sodium heart healthy   Complete by: As directed    Discharge instructions   Complete by: As directed    You may shower, dressing is waterproof.  Do not bathe or soak the operative  shoulder in a tub, pool.  Use the CPM machine 3 times a day for one hour each time, increase the degrees of range of motion with each session.  No lifting with the operative shoulder. Continue use of the sling.  Follow-up with Dr. Marlou Sa in ~2 weeks on your given appointment date (06/29/2020 which is a Monday).  We will remove your adhesive bandage at that time.  Call the office at [phone] with any questions/concerns.  Dental Antibiotics:  In most cases prophylactic antibiotics for Dental procdeures after total joint surgery are not necessary.  Exceptions are as follows:  1. History of prior total joint infection  2. Severely immunocompromised (Organ Transplant, cancer chemotherapy, Rheumatoid biologic meds such as [place])  3. Poorly controlled diabetes (A1C &gt; 8.0, blood glucose over 200)  If you have one of these conditions, contact your surgeon for an antibiotic prescription, prior to your dental procedure.   Increase activity slowly as tolerated   Complete by: As directed    Post-operative opioid taper instructions:   Complete by: As directed    POST-OPERATIVE OPIOID TAPER INSTRUCTIONS: It is important to wean off of your opioid medication as soon as possible. If you do not need pain medication after your surgery it is ok to stop day one. Opioids include: Codeine, Hydrocodone(Norco, Vicodin), Oxycodone(Percocet, oxycontin) and hydromorphone amongst others.  Long term and even short term use of opiods can cause: Increased pain response Dependence Constipation Depression Respiratory depression And more.  Withdrawal symptoms can include Flu like symptoms Nausea, vomiting And more Techniques to manage these symptoms Hydrate well Eat regular healthy meals Stay active Use relaxation techniques(deep breathing, meditating, yoga) Do Not substitute Alcohol to help with tapering If you have been on opioids for less than two weeks and do not have pain than it is ok to stop all  together.  Plan to wean off of opioids This  plan should start within one week post op of your joint replacement. Maintain the same interval or time between taking each dose and first decrease the dose.  Cut the total daily intake of opioids by one tablet each day Next start to increase the time between doses. The last dose that should be eliminated is the evening dose.             SignedDonella Stade 06/17/2020, 4:16 PM

## 2020-06-17 NOTE — Telephone Encounter (Signed)
Pt just got home from surgery with Dr. Marlou Sa (06/16/20) and said she had a missed called from our number. I checked with Jackelyn Poling who had not called her but advised pt that I would make note that she returned the phone call. The best call back number is [phone].

## 2020-06-18 ENCOUNTER — Other Ambulatory Visit (HOSPITAL_COMMUNITY): Payer: Self-pay | Admitting: Family Medicine

## 2020-06-18 ENCOUNTER — Other Ambulatory Visit (HOSPITAL_COMMUNITY): Payer: Self-pay

## 2020-06-18 ENCOUNTER — Telehealth: Payer: Self-pay

## 2020-06-18 NOTE — Telephone Encounter (Signed)
Faxed opnote and discharge summary to [phone] per [facility]

## 2020-06-19 ENCOUNTER — Other Ambulatory Visit (HOSPITAL_COMMUNITY): Payer: Self-pay

## 2020-06-19 MED ORDER — AMLODIPINE BESYLATE 5 MG PO TABS
ORAL_TABLET | ORAL | 0 refills | Status: DC
  Filled 2020-06-19: qty 90, 90d supply, fill #0

## 2020-06-20 DIAGNOSIS — M19019 Primary osteoarthritis, unspecified shoulder: Secondary | ICD-10-CM

## 2020-06-22 ENCOUNTER — Telehealth: Payer: Self-pay

## 2020-06-22 ENCOUNTER — Other Ambulatory Visit: Payer: Self-pay | Admitting: *Deleted

## 2020-06-22 ENCOUNTER — Encounter: Payer: Self-pay | Admitting: *Deleted

## 2020-06-22 NOTE — Telephone Encounter (Signed)
Patient came into the office and paid $25.00 to [employer] for FMLA paperwork to be completed.

## 2020-06-22 NOTE — Patient Outreach (Signed)
[facility]) Care Management  06/22/2020  Angela Villanueva [birth year] [id number]  Transition of care call/case closure   Referral received: 06/22/20 Initial outreach:06/22/20 Insurance: [employer]   Subjective: Initial successful telephone call to patient's preferred number in order to complete transition of care assessment; 2 HIPAA identifiers verified. Explained purpose of call and completed transition of care assessment.  Angela Villanueva states that she is doing well  denies post-operative problems, says surgical incisions are unremarkable states  dressing in place. She reports surgical pain well managed with prescribed medications. She reports tolerating use of CPM machine as recommended. She is working on exercises provided  and wearing sling. She reports tolerating diet, denies bowel or bladder problems.  Spouse is assisting with her recovery.   Reviewed accessing the following [place] Benefits : She denies any ongoing health issues and says she does not need a referral to one of the [place] chronic disease management programs.  She states she has the hospital indemnity plan and has made contact with [employer]  She uses a [facility] outpatient pharmacy at [employer]    .    Objective:  [name] was hospitalized at [facility] 6/14-6/15/22 for Right total shoulder arthroplasty  She was discharged to home on 06/17/20 without the need for home health services or DME.   Assessment:  Patient voices good understanding of all discharge instructions.  See transition of care flowsheet for assessment details.   Plan:  Reviewed hospital discharge diagnosis of Right total shoulder arthroplasty  and discharge treatment plan using hospital discharge instructions, assessing medication adherence, reviewing problems requiring provider notification, and discussing the importance of follow up with surgeon, primary care provider and/or specialists  as directed.  Reviewed [place] healthy lifestyle program information to receive discounted premium for  2023   Step 1: Get  your annual physical  Step 2: Complete your health assessment  Step 3:Identify your current health status and complete the corresponding action step between January 04, 2020 and September 03, 2020.     No ongoing care management needs identified so will close case to [place] Management services and route successful outreach letter with [place] Management pamphlet and 24 Hour Nurse Line Magnet to [facility] clinical pool to be mailed to patient's home address.  Thanked patient for their services to [facility].  Joylene Draft, RN, BSN  [place] Management Coordinator  [phone]- Mobile [phone]- Toll Free Main Office

## 2020-06-29 ENCOUNTER — Ambulatory Visit (INDEPENDENT_AMBULATORY_CARE_PROVIDER_SITE_OTHER): Payer: No Typology Code available for payment source

## 2020-06-29 ENCOUNTER — Encounter: Payer: Self-pay | Admitting: Orthopedic Surgery

## 2020-06-29 ENCOUNTER — Ambulatory Visit (INDEPENDENT_AMBULATORY_CARE_PROVIDER_SITE_OTHER): Payer: No Typology Code available for payment source | Admitting: Orthopedic Surgery

## 2020-06-29 ENCOUNTER — Encounter: Payer: No Typology Code available for payment source | Admitting: Orthopedic Surgery

## 2020-06-29 DIAGNOSIS — Z96611 Presence of right artificial shoulder joint: Secondary | ICD-10-CM

## 2020-06-29 DIAGNOSIS — M19011 Primary osteoarthritis, right shoulder: Secondary | ICD-10-CM

## 2020-06-29 NOTE — Progress Notes (Signed)
Post-Op Visit Note   Patient: Angela Villanueva           Date of Birth: [birth year]           MRN: [id number] Visit Date: 06/29/2020 PCP: Jordan Hawks, FNP   Assessment & Plan:  Chief Complaint:  Chief Complaint  Patient presents with   Right Shoulder - Routine Post Op   Visit Diagnoses:  1. Primary osteoarthritis of right shoulder   2. History of arthroplasty of right shoulder     Plan: Patient is a 65 year old female presents s/p right total shoulder arthroplasty on 06/16/2020.  She is doing very well.  She is up to 90 degrees on the CPM.  Only taking Aleve for pain control.  She is currently in her sling and not lift anything heavier than 5 pounds.  Plan to discontinue sling today but still abide by the 5 pound lifting restriction.  On exam she has 10 degrees external rotation, 85 degrees abduction, 120 degrees forward flexion.  Incision is healing well without any evidence of infection or dehiscence.  Excellent subscap strength.  Axillary nerve intact with deltoid firing.  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  We will start patient in outpatient physical therapy to work on primarily passive range of motion of the right shoulder for now and then graduate to strengthening exercises of the right shoulder about 6 weeks out from procedure.  Patient is going to obtain the fax number for her disability contact as well as her jobs fax number so that documentation can be provided detailing the return to work date that is planned for September 14.  Follow-up in 4 weeks for clinical recheck.  Radiographs were reviewed with the patient today and do show the prosthesis in excellent position with no complicating features.  Follow-Up Instructions: No follow-ups on file.   Orders:  Orders Placed This Encounter  Procedures   XR Shoulder Right   Ambulatory referral to Physical Therapy   No orders of the defined  types were placed in this encounter.   Imaging: No results found.  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   Gastroparesis 09/01/2015   Abdominal pain, epigastric 01/22/2014   Esophageal reflux 05/02/2012   Dysphagia 05/02/2012   DUODENITIS, WITHOUT HEMORRHAGE 04/09/2007   Constipation 04/09/2007   History of peptic ulcer disease 04/09/2007   Past Medical History:  Diagnosis Date   Allergy    Anemia    Arthritis    Diverticulitis    Gastric ulcer    GERD (gastroesophageal reflux disease)    Hepatitis    Hypertension     Family History  Problem Relation Age of Onset   COPD Mother    Cancer Mother    Other Mother        blood cloth   Diabetes Other     Past Surgical History:  Procedure Laterality Date   ABDOMINAL HYSTERECTOMY     EYE SURGERY     fibroid tumor removal     FOOT SURGERY     LAPAROSCOPIC GASTROTOMY W/ REPAIR OF ULCER     PARTIAL HYSTERECTOMY     TONSILLECTOMY     AS CHILD   TOTAL SHOULDER ARTHROPLASTY Right 06/16/2020   Procedure: RIGHT TOTAL SHOULDER ARTHROPLASTY;  Surgeon: Meredith Pel, MD;  Location: [facility];  Service: Orthopedics;  Laterality: Right;   TUBAL LIGATION     Social History   Occupational History   Occupation: Custodian    Employer: [employer]  Tobacco Use   Smoking status: Former    Pack years: 0.00    Types: Cigarettes    Quit date: 01/04/1992    Years since quitting: 28.5   Smokeless tobacco: Never  Vaping Use   Vaping Use: Never used  Substance and Sexual Activity   Alcohol use: No   Drug use: No   Sexual activity: Not Currently

## 2020-06-30 ENCOUNTER — Telehealth: Payer: Self-pay | Admitting: Orthopedic Surgery

## 2020-06-30 NOTE — Telephone Encounter (Signed)
Pt called stating she needs her work note to have the estimated date she will return to work written down. She states she discussed going back on 09/16/20 with Lurena Joiner and would like a CB when the work note has been updated and is ready for pick up.   [phone]

## 2020-06-30 NOTE — Telephone Encounter (Signed)
IC discussed with patient.

## 2020-07-01 ENCOUNTER — Encounter: Payer: No Typology Code available for payment source | Admitting: Orthopedic Surgery

## 2020-07-02 ENCOUNTER — Other Ambulatory Visit: Payer: Self-pay

## 2020-07-02 ENCOUNTER — Encounter: Payer: Self-pay | Admitting: Physical Therapy

## 2020-07-02 ENCOUNTER — Ambulatory Visit: Payer: No Typology Code available for payment source | Admitting: Physical Therapy

## 2020-07-02 DIAGNOSIS — M6281 Muscle weakness (generalized): Secondary | ICD-10-CM | POA: Diagnosis not present

## 2020-07-02 DIAGNOSIS — R600 Localized edema: Secondary | ICD-10-CM

## 2020-07-02 DIAGNOSIS — M25511 Pain in right shoulder: Secondary | ICD-10-CM

## 2020-07-02 DIAGNOSIS — M25611 Stiffness of right shoulder, not elsewhere classified: Secondary | ICD-10-CM

## 2020-07-02 NOTE — Therapy (Signed)
[facility] Therapy [address] [place], [place], [address] Phone: [phone]   Fax:  [phone]  Physical Therapy Evaluation  Patient Details  Name: Angela Villanueva MRN: [id number] Date of Birth: [birth year] Referring Provider (PT): Donella Stade, [place]   Encounter Date: 07/02/2020   PT End of Session - 07/02/20 0834     Visit Number 1    Number of Visits 21    Date for PT Re-Evaluation 09/24/20    Authorization Type [facility] - med necessity review after 25 visits    PT Start Time 0805    PT Stop Time 0833    PT Time Calculation (min) 28 min    Activity Tolerance Patient tolerated treatment well    Behavior During Therapy [facility] for tasks assessed/performed             Past Medical History:  Diagnosis Date   Allergy    Anemia    Arthritis    Diverticulitis    Gastric ulcer    GERD (gastroesophageal reflux disease)    Hepatitis    Hypertension     Past Surgical History:  Procedure Laterality Date   ABDOMINAL HYSTERECTOMY     EYE SURGERY     fibroid tumor removal     FOOT SURGERY     LAPAROSCOPIC GASTROTOMY W/ REPAIR OF ULCER     PARTIAL HYSTERECTOMY     TONSILLECTOMY     AS CHILD   TOTAL SHOULDER ARTHROPLASTY Right 06/16/2020   Procedure: RIGHT TOTAL SHOULDER ARTHROPLASTY;  Surgeon: Meredith Pel, MD;  Location: [facility];  Service: Orthopedics;  Laterality: Right;   TUBAL LIGATION      There were no vitals filed for this visit.    Subjective Assessment - 07/02/20 0808     Subjective Pt is a 65 y/o female who presents to [facility] s/p Rt total shoulder arthroplasty on 06/16/20.  She has CPM machine at home working arm up to 90 deg at this time.    Limitations Lifting    Patient Stated Goals return to 100%, regain use of RUE    Currently in Pain? Yes    Pain Score 0-No pain   up to 3/10   Pain Location Shoulder    Pain Orientation Right    Pain Descriptors / Indicators Aching    Pain Type Acute pain;Surgical pain     Pain Onset 1 to 4 weeks ago    Pain Frequency Intermittent    Aggravating Factors  quick movements; hygiene activities    Pain Relieving Factors aleve (minimal)                [facility] PT Assessment - 07/02/20 0811       Assessment   Medical Diagnosis Z96.611 (ICD-10-CM) - History of arthroplasty of right shoulder    Referring Provider (PT) Magnant, Gerrianne Scale, PA-C    Onset Date/Surgical Date 06/16/20    Hand Dominance Right    Next MD Visit 07/27/20    Prior Therapy none      Precautions   Precautions None    Precaution Comments PROM until 7/26      Restrictions   Weight Bearing Restrictions No      Balance Screen   Has the patient fallen in the past 6 months No    Has the patient had a decrease in activity level because of a fear of falling?  No    Is the patient reluctant to leave their home because of  a fear of falling?  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, standing on feet all day    Leisure reading the bible      Cognition   Overall Cognitive Status Within Functional Limits for tasks assessed      Observation/Other Assessments   Focus on Therapeutic Outcomes (FOTO)  40 (predicted 64)      ROM / Strength   AROM / PROM / Strength PROM;Strength      PROM   PROM Assessment Site Shoulder    Right/Left Shoulder Right    Right Shoulder Flexion 125 Degrees    Right Shoulder ABduction 111 Degrees    Right Shoulder Internal Rotation 34 Degrees    Right Shoulder External Rotation 85 Degrees      Strength   Overall Strength Comments deferred due to post op precautions                        Objective measurements completed on examination: See above findings.        [facility] Adult PT Treatment/Exercise - 07/02/20 0834       Exercises   Exercises Other Exercises    Other Exercises  see pt instructions; performed HEP with mod cues                    PT Education - 07/02/20 0828     Education Details HEP    Person(s) Educated Patient    Methods Explanation;Demonstration;Handout    Comprehension Verbalized understanding;Returned demonstration;Need further instruction              PT Short Term Goals - 07/02/20 0838       PT SHORT TERM GOAL #1   Title Independent with HEP    Time 3    Period Weeks    Status New    Target Date 07/23/20               PT Long Term Goals - 07/02/20 0838       PT LONG TERM GOAL #1   Title Independent with final HEP    Time 12    Period Weeks    Status New    Target Date 09/24/20      PT LONG TERM GOAL #2   Title Improve FOTO score to 64 for improved mobility    Time 12    Period Weeks    Status New    Target Date 09/24/20      PT LONG TERM GOAL #3   Title Improve Rt shoulder AROM to [facility] for improved function and mobility    Time 12    Period Weeks    Status New    Target Date 09/24/20      PT LONG TERM GOAL #4   Title Improve Rt shoulder strength to at least 4/5 for improved function    Time 12    Period Weeks    Status New    Target Date 09/24/20      PT LONG TERM GOAL #5   Title report no pain with work simulated activities for improved function in anticipation of return to  work    Time 12    Period Weeks    Status New    Target Date 09/24/20                    Plan - 07/02/20 0835     Clinical Impression Statement Pt is a 65 year old female who presents to [facility] s/p right total shoulder arthroplasty on 06/16/2020.  She demonstrates post op pain as well as decreased strength and ROM affecting functional mobility.  She will benefit from PT to address deficits listed.    Personal Factors and Comorbidities Comorbidity 3+;Profession    Comorbidities  anemia, arthritis, diverticulitis, GERD, hepatitis, and HTN    Examination-Activity Limitations Bathing;Caring for Others;Carry;Lift;Reach Overhead;Hygiene/Grooming    Examination-Participation Restrictions Church;Cleaning;Community Activity;Occupation;Meal Prep;Laundry;Driving    Stability/Clinical Decision Making Stable/Uncomplicated    Clinical Decision Making Low    Rehab Potential Good    PT Frequency 2x / week   1x/wk x 3 wks, then 2x/wk x 9 wks   PT Duration 12 weeks    PT Treatment/Interventions ADLs/Self Care Home Management;Cryotherapy;Electrical Stimulation;Moist Heat;Therapeutic exercise;Therapeutic activities;Functional mobility training;Neuromuscular re-education;Patient/family education;Manual techniques;Vasopneumatic Device;Taping;Dry needling;Passive range of motion    PT Next Visit Plan review HEP, PROM as tolerated, modalities PRN    PT Home Exercise Plan Access Code: [email or web address]    [email or web address] and Agree with Plan of Care Patient             Patient will benefit from skilled therapeutic intervention in order to improve the following deficits and impairments:  Pain, Impaired UE functional use, Decreased strength, Decreased range of motion  Visit Diagnosis: Acute pain of right shoulder - Plan: PT plan of care cert/re-cert  Stiffness of right shoulder, not elsewhere classified - Plan: PT plan of care cert/re-cert  Localized edema - Plan: PT plan of care cert/re-cert  Muscle weakness (generalized) - Plan: PT plan of care cert/re-cert     Problem List Patient Active Problem List   Diagnosis Date Noted   Shoulder arthritis    S/P shoulder replacement, 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 [place], [place], [address] Phone: [phone]   Fax:  [phone]  Name: SHELLI PORTILLA MRN: [id number] Date of Birth: April 02, 1955

## 2020-07-02 NOTE — Patient Instructions (Signed)
Access Code: [email or web address] URL: [email or web address] Date: 07/02/2020 Prepared by: Faustino Congress  Exercises Circular Shoulder Pendulum with Table Support - 3 x daily - 7 x weekly - 2 sets - 10 reps Flexion-Extension Shoulder Pendulum with Table Support - 3 x daily - 7 x weekly - 2 sets - 10 reps Horizontal Shoulder Pendulum with Table Support - 3 x daily - 7 x weekly - 2 sets - 10 reps Seated Shoulder Flexion Towel Slide at Table Top - 3 x daily - 7 x weekly - 2 sets - 10 reps Seated Shoulder Abduction Towel Slide at Table Top - 3 x daily - 7 x weekly - 2 sets - 10 reps Seated Shoulder External Rotation PROM on Table - 3 x daily - 7 x weekly - 2 sets - 10 reps Supported Elbow Flexion Extension AROM - 3 x daily - 7 x weekly - 2 sets - 10 reps Wrist Circumduction AROM - 3 x daily - 7 x weekly - 2 sets - 10 reps

## 2020-07-08 ENCOUNTER — Ambulatory Visit: Payer: No Typology Code available for payment source | Admitting: Rehabilitative and Restorative Service Providers"

## 2020-07-08 ENCOUNTER — Encounter: Payer: Self-pay | Admitting: Rehabilitative and Restorative Service Providers"

## 2020-07-08 ENCOUNTER — Other Ambulatory Visit: Payer: Self-pay

## 2020-07-08 DIAGNOSIS — M25611 Stiffness of right shoulder, not elsewhere classified: Secondary | ICD-10-CM | POA: Diagnosis not present

## 2020-07-08 DIAGNOSIS — R600 Localized edema: Secondary | ICD-10-CM

## 2020-07-08 DIAGNOSIS — M25511 Pain in right shoulder: Secondary | ICD-10-CM | POA: Diagnosis not present

## 2020-07-08 DIAGNOSIS — M6281 Muscle weakness (generalized): Secondary | ICD-10-CM

## 2020-07-08 NOTE — Therapy (Signed)
[facility] Physical Therapy [address] [place], [place], [address] Phone: [phone]   Fax:  [phone]  Physical Therapy Treatment  Patient Details  Name: Angela Villanueva MRN: [id number] Date of Birth: 02-Aug-1955 Referring Provider (PT): Donella Stade, [place]   Encounter Date: 07/08/2020   PT End of Session - 07/08/20 1424     Visit Number 2    Number of Visits 21    Date for PT Re-Evaluation 09/24/20    Authorization Type [facility] - med necessity review after 25 visits    PT Start Time 0932    PT Stop Time 1015    PT Time Calculation (min) 43 min    Activity Tolerance Patient tolerated treatment well;No increased pain;Patient limited by fatigue    Behavior During Therapy [facility] for tasks assessed/performed             Past Medical History:  Diagnosis Date   Allergy    Anemia    Arthritis    Diverticulitis    Gastric ulcer    GERD (gastroesophageal reflux disease)    Hepatitis    Hypertension     Past Surgical History:  Procedure Laterality Date   ABDOMINAL HYSTERECTOMY     EYE SURGERY     fibroid tumor removal     FOOT SURGERY     LAPAROSCOPIC GASTROTOMY W/ REPAIR OF ULCER     PARTIAL HYSTERECTOMY     TONSILLECTOMY     AS CHILD   TOTAL SHOULDER ARTHROPLASTY Right 06/16/2020   Procedure: RIGHT TOTAL SHOULDER ARTHROPLASTY;  Surgeon: Meredith Pel, MD;  Location: [facility];  Service: Orthopedics;  Laterality: Right;   TUBAL LIGATION      There were no vitals filed for this visit.   Subjective Assessment - 07/08/20 1017     Subjective Cera reports good early HEP compliance.  She has little pain.    Limitations Lifting    Patient Stated Goals return to 100%, regain use of RUE    Currently in Pain? No/denies    Pain Score 0-No pain    Pain Onset 1 to 4 weeks ago    Pain Frequency Intermittent    Aggravating Factors  End range    Pain Relieving Factors Aleve PRN    Multiple Pain Sites No                [facility]  PT Assessment - 07/08/20 0001       PROM   PROM Assessment Site Shoulder    Right/Left Shoulder Right    Right Shoulder Flexion 130 Degrees    Right Shoulder Internal Rotation 60 Degrees    Right Shoulder External Rotation 25 Degrees                           [facility] Adult PT Treatment/Exercise - 07/08/20 0001       Exercises   Exercises Shoulder      Shoulder Exercises: Seated   Flexion AAROM;Right;10 reps;Limitations    Flexion Limitations 5 seconds thumb up in comfortable range, table slide    Abduction AAROM;Right;10 reps;Limitations    ABduction Limitations 5 seconds thumb up, table slide      Shoulder Exercises: Standing   Other Standing Exercises Codmans forward/back; side to side; CW circles and CCW circles 1 minutes    Other Standing Exercises Biceps curls No weight 10X with grip      Manual Therapy   Manual Therapy Passive  ROM    Passive ROM IR and flexion to comfort (no pain), limited ER to 25 degrees to protect subscapularis healing                    PT Education - 07/08/20 1422     Education Details Reviewed HEP.  Discussed avoiding using her R arm to press up out of a chair and avoid excessive extension and ER AROM.    Person(s) Educated Patient    Methods Explanation;Demonstration;Tactile cues;Verbal cues;Handout    Comprehension Verbalized understanding;Tactile cues required;Returned demonstration;Need further instruction;Verbal cues required              PT Short Term Goals - 07/08/20 1423       PT SHORT TERM GOAL #1   Title Independent with HEP    Time 3    Period Weeks    Status On-going    Target Date 07/23/20               PT Long Term Goals - 07/08/20 1424       PT LONG TERM GOAL #1   Title Independent with final HEP    Time 12    Period Weeks    Status On-going      PT LONG TERM GOAL #2   Title Improve FOTO score to 64 for improved mobility    Time 12    Period Weeks    Status On-going       PT LONG TERM GOAL #3   Title Improve Rt shoulder AROM to [facility] for improved function and mobility    Time 12    Period Weeks    Status On-going      PT LONG TERM GOAL #4   Title Improve Rt shoulder strength to at least 4/5 for improved function    Time 12    Period Weeks    Status On-going      PT LONG TERM GOAL #5   Title report no pain with work simulated activities for improved function in anticipation of return to work    Time 12    Period Weeks    Status On-going                   Plan - 07/08/20 [place] Fortunata is doing very well < 4 weeks post shoulder replacement.  She is sleeping "normally" and has little pain.  We reviewed her HEP and discussed avoiding excessive shoulder ER and extension AROM and to avoid pushing out of a chair with the R shoulder.  Continue PROM, AAROM and AROM progression.    Personal Factors and Comorbidities Comorbidity 3+;Profession    Comorbidities anemia, arthritis, diverticulitis, GERD, hepatitis, and HTN    Examination-Activity Limitations Bathing;Caring for Others;Carry;Lift;Reach Overhead;Hygiene/Grooming    Examination-Participation Restrictions Church;Cleaning;Community Activity;Occupation;Meal Prep;Laundry;Driving    Stability/Clinical Decision Making Stable/Uncomplicated    Rehab Potential Good    PT Frequency 2x / week   1x/wk x 3 wks, then 2x/wk x 9 wks   PT Duration 12 weeks    PT Treatment/Interventions ADLs/Self Care Home Management;Cryotherapy;Electrical Stimulation;Moist Heat;Therapeutic exercise;Therapeutic activities;Functional mobility training;Neuromuscular re-education;Patient/family education;Manual techniques;Vasopneumatic Device;Taping;Dry needling;Passive range of motion    PT Next Visit Plan PROM, AAROM and AROM as appropriate.  Avoid excessive ER AROM.    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

## 2020-07-10 ENCOUNTER — Other Ambulatory Visit (HOSPITAL_COMMUNITY): Payer: Self-pay

## 2020-07-13 ENCOUNTER — Encounter: Payer: Self-pay | Admitting: Physical Therapy

## 2020-07-13 ENCOUNTER — Ambulatory Visit: Payer: No Typology Code available for payment source | Admitting: Physical Therapy

## 2020-07-13 ENCOUNTER — Other Ambulatory Visit: Payer: Self-pay

## 2020-07-13 DIAGNOSIS — M25611 Stiffness of right shoulder, not elsewhere classified: Secondary | ICD-10-CM | POA: Diagnosis not present

## 2020-07-13 DIAGNOSIS — M6281 Muscle weakness (generalized): Secondary | ICD-10-CM | POA: Diagnosis not present

## 2020-07-13 DIAGNOSIS — R600 Localized edema: Secondary | ICD-10-CM | POA: Diagnosis not present

## 2020-07-13 DIAGNOSIS — M25511 Pain in right shoulder: Secondary | ICD-10-CM | POA: Diagnosis not present

## 2020-07-13 NOTE — Therapy (Signed)
[facility] [address] [place], [place], [address] Phone: [phone]   Fax:  [phone]  Physical Therapy Treatment  Patient Details  Name: Angela Villanueva MRN: [id number] Date of Birth: May 31, 1955 Referring Provider (PT): Donella Stade, [place]   Encounter Date: 07/13/2020   PT End of Session - 07/13/20 1514     Visit Number 3    Number of Visits 21    Date for PT Re-Evaluation 09/24/20    Authorization Type [facility] - med necessity review after 25 visits    PT Start Time 1510    PT Stop Time 5465    PT Time Calculation (min) 38 min    Activity Tolerance Patient tolerated treatment well;No increased pain;Patient limited by fatigue    Behavior During Therapy [facility] for tasks assessed/performed             Past Medical History:  Diagnosis Date   Allergy    Anemia    Arthritis    Diverticulitis    Gastric ulcer    GERD (gastroesophageal reflux disease)    Hepatitis    Hypertension     Past Surgical History:  Procedure Laterality Date   ABDOMINAL HYSTERECTOMY     EYE SURGERY     fibroid tumor removal     FOOT SURGERY     LAPAROSCOPIC GASTROTOMY W/ REPAIR OF ULCER     PARTIAL HYSTERECTOMY     TONSILLECTOMY     AS CHILD   TOTAL SHOULDER ARTHROPLASTY Right 06/16/2020   Procedure: RIGHT TOTAL SHOULDER ARTHROPLASTY;  Surgeon: Meredith Pel, MD;  Location: [place];  Service: Orthopedics;  Laterality: Right;   TUBAL LIGATION      There were no vitals filed for this visit.   Subjective Assessment - 07/13/20 1509     Subjective doing well, no pain    Limitations Lifting    Patient Stated Goals return to 100%, regain use of RUE    Currently in Pain? No/denies    Pain Onset --                               [facility] Adult PT Treatment/Exercise - 07/13/20 1513       Shoulder Exercises: Supine   Flexion AAROM;5 reps   hands clasped together   Other Supine Exercises bicep curl x 20 reps       Shoulder Exercises: Seated   Retraction Both;20 reps   5 sec hold     Shoulder Exercises: ROM/Strengthening   Pendulum ant/post, lateral and circles x 1 min each      Shoulder Exercises: Stretch   Table Stretch - Flexion --   20 reps x 5 sec hold   Table Stretch - Abduction --   20 reps x 5 sec hold   Table Stretch - External Rotation --   20 reps x 5 sec hold     Manual Therapy   Passive ROM Rt shoulder all motions to tolerance                      PT Short Term Goals - 07/13/20 1547       PT SHORT TERM GOAL #1   Title Independent with HEP    Time 3    Period Weeks    Status Achieved    Target Date 07/23/20  PT Long Term Goals - 07/08/20 1424       PT LONG TERM GOAL #1   Title Independent with final HEP    Time 12    Period Weeks    Status On-going      PT LONG TERM GOAL #2   Title Improve FOTO score to 64 for improved mobility    Time 12    Period Weeks    Status On-going      PT LONG TERM GOAL #3   Title Improve Rt shoulder AROM to [facility] for improved function and mobility    Time 12    Period Weeks    Status On-going      PT LONG TERM GOAL #4   Title Improve Rt shoulder strength to at least 4/5 for improved function    Time 12    Period Weeks    Status On-going      PT LONG TERM GOAL #5   Title report no pain with work simulated activities for improved function in anticipation of return to work    Time 12    Period Weeks    Status On-going                   Plan - 07/13/20 1547     Clinical Impression Statement Pt is compliant and independent with initial HEP within protocol guidelines.  Needs to continue with PROM and light AAROM exercises until 7/26 at this time per MD oreder.  Will continue to benefit from PT to maximize function.    Personal Factors and Comorbidities Comorbidity 3+;Profession    Comorbidities anemia, arthritis, diverticulitis, GERD, hepatitis, and HTN    Examination-Activity Limitations  Bathing;Caring for Others;Carry;Lift;Reach Overhead;Hygiene/Grooming    Examination-Participation Restrictions Church;Cleaning;Community Activity;Occupation;Meal Prep;Laundry;Driving    Stability/Clinical Decision Making Stable/Uncomplicated    Rehab Potential Good    PT Frequency 2x / week   1x/wk x 3 wks, then 2x/wk x 9 wks   PT Duration 12 weeks    PT Treatment/Interventions ADLs/Self Care Home Management;Cryotherapy;Electrical Stimulation;Moist Heat;Therapeutic exercise;Therapeutic activities;Functional mobility training;Neuromuscular re-education;Patient/family education;Manual techniques;Vasopneumatic Device;Taping;Dry needling;Passive range of motion    PT Next Visit Plan PROM with light AA exercise until 7/26; try pulleys next visit    PT Home Exercise Plan Access Code: [email or web address]    Consulted and Agree with Plan of Care Patient             Patient will benefit from skilled therapeutic intervention in order to improve the following deficits and impairments:  Pain, Impaired UE functional use, Decreased strength, Decreased range of motion  Visit Diagnosis: Stiffness of right shoulder, not elsewhere classified  Acute pain of right shoulder  Localized edema  Muscle weakness (generalized)     Problem List Patient Active Problem List   Diagnosis Date Noted   Shoulder arthritis    S/P shoulder replacement, 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 [place], [place], [address] Phone: [phone]   Fax:  [phone]  Name: Angela Villanueva MRN: [id number] Date of Birth: 25-Apr-1955

## 2020-07-20 ENCOUNTER — Ambulatory Visit (INDEPENDENT_AMBULATORY_CARE_PROVIDER_SITE_OTHER): Payer: No Typology Code available for payment source | Admitting: Physical Therapy

## 2020-07-20 ENCOUNTER — Telehealth: Payer: Self-pay

## 2020-07-20 ENCOUNTER — Other Ambulatory Visit: Payer: Self-pay

## 2020-07-20 DIAGNOSIS — R600 Localized edema: Secondary | ICD-10-CM

## 2020-07-20 DIAGNOSIS — M25511 Pain in right shoulder: Secondary | ICD-10-CM

## 2020-07-20 DIAGNOSIS — M6281 Muscle weakness (generalized): Secondary | ICD-10-CM | POA: Diagnosis not present

## 2020-07-20 DIAGNOSIS — M25611 Stiffness of right shoulder, not elsewhere classified: Secondary | ICD-10-CM | POA: Diagnosis not present

## 2020-07-20 NOTE — Telephone Encounter (Signed)
Pt came into the office stating that when she touches her arm below her incision where her shoulder was replaced she is burping and cant stop she stated that her arm was extremely tight. She has an appt schedule on Wednesday but she would like to know what she can do till then.

## 2020-07-20 NOTE — Telephone Encounter (Signed)
Dc robaxin may need  other meds  thorizine 25 po q 6 for 2 days prn hiccups # 10

## 2020-07-20 NOTE — Therapy (Signed)
[facility] [address] [place], [place], [address] Phone: [phone]   Fax:  [phone]  Physical Therapy Treatment  Patient Details  Name: Angela Villanueva MRN: [id number] Date of Birth: 02-14-1955 Referring Provider (PT): Donella Stade, [place]   Encounter Date: 07/20/2020   PT End of Session - 07/20/20 1031     Visit Number 4    Number of Visits 21    Date for PT Re-Evaluation 09/24/20    Authorization Type [facility] - med necessity review after 25 visits    PT Start Time 0800    PT Stop Time 0843    PT Time Calculation (min) 43 min    Activity Tolerance Patient tolerated treatment well;Patient limited by pain    Behavior During Therapy [facility] for tasks assessed/performed             Past Medical History:  Diagnosis Date   Allergy    Anemia    Arthritis    Diverticulitis    Gastric ulcer    GERD (gastroesophageal reflux disease)    Hepatitis    Hypertension     Past Surgical History:  Procedure Laterality Date   ABDOMINAL HYSTERECTOMY     EYE SURGERY     fibroid tumor removal     FOOT SURGERY     LAPAROSCOPIC GASTROTOMY W/ REPAIR OF ULCER     PARTIAL HYSTERECTOMY     TONSILLECTOMY     AS CHILD   TOTAL SHOULDER ARTHROPLASTY Right 06/16/2020   Procedure: RIGHT TOTAL SHOULDER ARTHROPLASTY;  Surgeon: Meredith Pel, MD;  Location: [place];  Service: Orthopedics;  Laterality: Right;   TUBAL LIGATION      There were no vitals filed for this visit.    Subjective: Keltie Labell reports about 10/10 pain in her mid arm and she feels like it is gas buildup in her arm and when you touch it, it makes her burp. It has been like this since Saturday morning. She wants to know what she should do about this. PT is unsure what this is and advised her to reach out to her MD about this pain.   Objective: Seated Tableslides flexion, abd, and ER all 5 sec holds X 15 ea Pulleys 2 min flexion, 2 min abd Pendulums X 20  CW,CCW Supine AAROM flexion, abd, ER 5 sec holds X 10 ea Vasopnuematic device 10 minutes to Rt shoulder medium compression, 34 deg   Assessment:  Pt had increased pain and edema noted in her Right mid arm that she thinks is gas buildup. Trialed vasopnumatic device in efforts to reduce the edema, pain and soreness she is having and recommended she follow up with MD about this severe pain. On a positive note her [place] is progressing very well.  Plan: We will have he follow up with MD and will continue with TSA rehab protocol to her tolerance level      PT Short Term Goals - 07/13/20 1547       PT SHORT TERM GOAL #1   Title Independent with HEP    Time 3    Period Weeks    Status Achieved    Target Date 07/23/20               PT Long Term Goals - 07/08/20 1424       PT LONG TERM GOAL #1   Title Independent with final HEP    Time 12    Period Weeks  Status On-going      PT LONG TERM GOAL #2   Title Improve FOTO score to 64 for improved mobility    Time 12    Period Weeks    Status On-going      PT LONG TERM GOAL #3   Title Improve Rt shoulder AROM to [facility] for improved function and mobility    Time 12    Period Weeks    Status On-going      PT LONG TERM GOAL #4   Title Improve Rt shoulder strength to at least 4/5 for improved function    Time 12    Period Weeks    Status On-going      PT LONG TERM GOAL #5   Title report no pain with work simulated activities for improved function in anticipation of return to work    Time 12    Period Weeks    Status On-going                    Patient will benefit from skilled therapeutic intervention in order to improve the following deficits and impairments:     Visit Diagnosis: Stiffness of right shoulder, not elsewhere classified  Acute pain of right shoulder  Localized edema  Muscle weakness (generalized)     Problem List Patient Active Problem List   Diagnosis Date Noted   Shoulder arthritis     S/P shoulder replacement, 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 [place], [place], [address] Phone: [phone]   Fax:  [phone]  Name: Angela Villanueva MRN: [id number] Date of Birth: April 07, 1955

## 2020-07-21 NOTE — Telephone Encounter (Signed)
Ok not sure about the burp thing

## 2020-07-22 ENCOUNTER — Ambulatory Visit (INDEPENDENT_AMBULATORY_CARE_PROVIDER_SITE_OTHER): Payer: No Typology Code available for payment source | Admitting: Orthopedic Surgery

## 2020-07-22 ENCOUNTER — Ambulatory Visit (INDEPENDENT_AMBULATORY_CARE_PROVIDER_SITE_OTHER): Payer: No Typology Code available for payment source

## 2020-07-22 ENCOUNTER — Other Ambulatory Visit (HOSPITAL_COMMUNITY): Payer: Self-pay

## 2020-07-22 ENCOUNTER — Encounter: Payer: Self-pay | Admitting: Orthopedic Surgery

## 2020-07-22 VITALS — Ht 64.0 in | Wt 100.0 lb

## 2020-07-22 DIAGNOSIS — Z96611 Presence of right artificial shoulder joint: Secondary | ICD-10-CM

## 2020-07-22 MED ORDER — HYDROCODONE-ACETAMINOPHEN 5-325 MG PO TABS
1.0000 | ORAL_TABLET | Freq: Two times a day (BID) | ORAL | 0 refills | Status: DC | PRN
  Filled 2020-07-22: qty 20, 10d supply, fill #0

## 2020-07-22 MED ORDER — CYCLOBENZAPRINE HCL 5 MG PO TABS
10.0000 mg | ORAL_TABLET | Freq: Three times a day (TID) | ORAL | 0 refills | Status: DC | PRN
  Filled 2020-07-22: qty 30, 10d supply, fill #0

## 2020-07-22 MED ORDER — FAMOTIDINE 20 MG PO TABS
20.0000 mg | ORAL_TABLET | Freq: Every day | ORAL | 0 refills | Status: DC
  Filled 2020-07-22: qty 10, 10d supply, fill #0

## 2020-07-26 ENCOUNTER — Encounter: Payer: Self-pay | Admitting: Orthopedic Surgery

## 2020-07-26 NOTE — Progress Notes (Signed)
Post-Op Visit Note   Patient: Angela Villanueva           Date of Birth: September 02, 1955           MRN: [id number] Visit Date: 07/22/2020 PCP: Jordan Hawks, FNP   Assessment & Plan:  Chief Complaint:  Chief Complaint  Patient presents with   Right Shoulder - Follow-up    Right total shoulder arthroplasty 06/16/2020   Visit Diagnoses:  1. S/P reverse total shoulder arthroplasty, right     Plan: Angela Villanueva is a 65 year old patient underwent right shoulder replacement 06/16/2020.  She woke up Saturday morning and moving her arm and reported some swelling.  Denies any fevers and reports questionable chills.  Is having a lot of burping.  On examination there is no lymphadenopathy in the subscap is functional.  Range of motion is about 50/90/110.  Shoulder is located and rotator cuff strength is good.  Incision looks intact.  She does have mild swelling of the biceps region but no Popeye deformity.  Plan at this time is to put her on prescription strength Pepcid and have her keep her appointment for next week.  Does not really look like infection at this time.  Not entirely clear why she had the cute onset of being unable to move her arm.  Could be related to her biceps tendon tenodesis.  We will assess her in a week to 10 days.  Follow-Up Instructions: No follow-ups on file.   Orders:  Orders Placed This Encounter  Procedures   XR Shoulder Right   Meds ordered this encounter  Medications   famotidine (PEPCID) 20 MG tablet    Sig: Take 1 tablet (20 mg total) by mouth daily.    Dispense:  10 tablet    Refill:  0   cyclobenzaprine (FLEXERIL) 5 MG tablet    Sig: Take 2 tablets (10 mg total) by mouth every 8 (eight) hours as needed for muscle spasms.    Dispense:  30 tablet    Refill:  0   HYDROcodone-acetaminophen (NORCO/VICODIN) 5-325 MG tablet    Sig: Take 1 tablet by mouth every 12 (twelve) hours as needed for moderate pain (pain score 4-6).    Dispense:  20 tablet    Refill:  0     Imaging: No results found.  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   Gastroparesis 09/01/2015   Abdominal pain, epigastric 01/22/2014   Esophageal reflux 05/02/2012   Dysphagia 05/02/2012   DUODENITIS, WITHOUT HEMORRHAGE 04/09/2007   Constipation 04/09/2007   History of peptic ulcer disease 04/09/2007   Past Medical History:  Diagnosis Date   Allergy    Anemia    Arthritis    Diverticulitis    Gastric ulcer    GERD (gastroesophageal reflux disease)    Hepatitis    Hypertension     Family History  Problem Relation Age of Onset   COPD Mother    Cancer Mother    Other Mother        blood cloth   Diabetes Other     Past Surgical History:  Procedure Laterality Date   ABDOMINAL HYSTERECTOMY     EYE SURGERY     fibroid tumor removal     FOOT SURGERY     LAPAROSCOPIC GASTROTOMY W/ REPAIR OF ULCER     PARTIAL HYSTERECTOMY     TONSILLECTOMY     AS  CHILD   TOTAL SHOULDER ARTHROPLASTY Right 06/16/2020   Procedure: RIGHT TOTAL SHOULDER ARTHROPLASTY;  Surgeon: Meredith Pel, MD;  Location: [place];  Service: Orthopedics;  Laterality: Right;   TUBAL LIGATION     Social History   Occupational History   Occupation: Custodian    Employer: [employer]  Tobacco Use   Smoking status: Former    Types: Cigarettes    Quit date: 01/04/1992    Years since quitting: 28.5   Smokeless tobacco: Never  Vaping Use   Vaping Use: Never used  Substance and Sexual Activity   Alcohol use: No   Drug use: No   Sexual activity: Not Currently

## 2020-07-27 ENCOUNTER — Other Ambulatory Visit: Payer: Self-pay

## 2020-07-27 ENCOUNTER — Ambulatory Visit: Payer: No Typology Code available for payment source | Admitting: Physical Therapy

## 2020-07-27 ENCOUNTER — Ambulatory Visit (INDEPENDENT_AMBULATORY_CARE_PROVIDER_SITE_OTHER): Payer: No Typology Code available for payment source | Admitting: Orthopedic Surgery

## 2020-07-27 ENCOUNTER — Encounter: Payer: Self-pay | Admitting: Physical Therapy

## 2020-07-27 ENCOUNTER — Other Ambulatory Visit (HOSPITAL_COMMUNITY): Payer: Self-pay

## 2020-07-27 DIAGNOSIS — M25611 Stiffness of right shoulder, not elsewhere classified: Secondary | ICD-10-CM | POA: Diagnosis not present

## 2020-07-27 DIAGNOSIS — M6281 Muscle weakness (generalized): Secondary | ICD-10-CM | POA: Diagnosis not present

## 2020-07-27 DIAGNOSIS — R600 Localized edema: Secondary | ICD-10-CM | POA: Diagnosis not present

## 2020-07-27 DIAGNOSIS — Z96611 Presence of right artificial shoulder joint: Secondary | ICD-10-CM

## 2020-07-27 DIAGNOSIS — M25511 Pain in right shoulder: Secondary | ICD-10-CM

## 2020-07-27 MED ORDER — OMEPRAZOLE 40 MG PO CPDR
40.0000 mg | DELAYED_RELEASE_CAPSULE | Freq: Every day | ORAL | 0 refills | Status: DC
  Filled 2020-07-27: qty 30, 30d supply, fill #0

## 2020-07-27 NOTE — Therapy (Signed)
[facility] [address] [place], [place], [address] Phone: [phone]   Fax:  [phone]  Physical Therapy Treatment  Patient Details  Name: Angela Villanueva MRN: [id number] Date of Birth: 09-02-1955 Referring Provider (PT): Donella Stade, [place]   Encounter Date: 07/27/2020   PT End of Session - 07/27/20 1339     Visit Number 5    Number of Visits 21    Date for PT Re-Evaluation 09/24/20    Authorization Type [facility] - med necessity review after 25 visits    PT Start Time 1300    PT Stop Time 1339    PT Time Calculation (min) 39 min    Activity Tolerance Patient tolerated treatment well;Patient limited by pain    Behavior During Therapy [facility] for tasks assessed/performed             Past Medical History:  Diagnosis Date   Allergy    Anemia    Arthritis    Diverticulitis    Gastric ulcer    GERD (gastroesophageal reflux disease)    Hepatitis    Hypertension     Past Surgical History:  Procedure Laterality Date   ABDOMINAL HYSTERECTOMY     EYE SURGERY     fibroid tumor removal     FOOT SURGERY     LAPAROSCOPIC GASTROTOMY W/ REPAIR OF ULCER     PARTIAL HYSTERECTOMY     TONSILLECTOMY     AS CHILD   TOTAL SHOULDER ARTHROPLASTY Right 06/16/2020   Procedure: RIGHT TOTAL SHOULDER ARTHROPLASTY;  Surgeon: Meredith Pel, MD;  Location: [place];  Service: Orthopedics;  Laterality: Right;   TUBAL LIGATION      There were no vitals filed for this visit.   Subjective Assessment - 07/27/20 1259     Subjective having pain in Rt bicep today - went to MD, says it's making her burp    Limitations Lifting    Patient Stated Goals return to 100%, regain use of RUE    Pain Score 8     Pain Location Shoulder    Pain Orientation Right    Pain Descriptors / Indicators Aching    Pain Type Acute pain;Surgical pain    Pain Onset 1 to 4 weeks ago    Pain Frequency Intermittent    Aggravating Factors  end range, pain in bicep     Pain Relieving Factors aleve                [facility] PT Assessment - 07/27/20 1314       Assessment   Medical Diagnosis Z96.611 (ICD-10-CM) - History of arthroplasty of right shoulder    Referring Provider (PT) Magnant, Gerrianne Scale, PA-C    Onset Date/Surgical Date 06/16/20    Hand Dominance Right    Next MD Visit 07/27/20      Observation/Other Assessments   Focus on Therapeutic Outcomes (FOTO)  44 (predicted 64)      PROM   Right Shoulder Flexion 124 Degrees    Right Shoulder ABduction 105 Degrees    Right Shoulder Internal Rotation 68 Degrees   50 deg abdct   Right Shoulder External Rotation 43 Degrees   40 deg abduction                          [facility] Adult PT Treatment/Exercise - 07/27/20 1307       Shoulder Exercises: Supine   Flexion AAROM;10 reps  Flexion Limitations hands clasped together - 5 sec hold    Other Supine Exercises bicep curl x 20 reps      Shoulder Exercises: Seated   Retraction Both;20 reps   5 sec hold     Shoulder Exercises: Standing   Other Standing Exercises wall ladder x 10 with RUE; flexion and scaption      Shoulder Exercises: Pulleys   Flexion 3 minutes    Scaption 3 minutes      Manual Therapy   Passive ROM Rt shoulder all motions to tolerance                      PT Short Term Goals - 07/13/20 1547       PT SHORT TERM GOAL #1   Title Independent with HEP    Time 3    Period Weeks    Status Achieved    Target Date 07/23/20               PT Long Term Goals - 07/08/20 1424       PT LONG TERM GOAL #1   Title Independent with final HEP    Time 12    Period Weeks    Status On-going      PT LONG TERM GOAL #2   Title Improve FOTO score to 64 for improved mobility    Time 12    Period Weeks    Status On-going      PT LONG TERM GOAL #3   Title Improve Rt shoulder AROM to [facility] for improved function and mobility    Time 12    Period Weeks    Status On-going      PT LONG TERM GOAL  #4   Title Improve Rt shoulder strength to at least 4/5 for improved function    Time 12    Period Weeks    Status On-going      PT LONG TERM GOAL #5   Title report no pain with work simulated activities for improved function in anticipation of return to work    Time 12    Period Weeks    Status On-going                   Plan - 07/27/20 1340     Clinical Impression Statement Pt demonstrating steady progress with PROM and AA exercises today.  Plan to progress to light strengthening and AROM exercises next week as long as cleared by MD today.  Will continue to benefit from PT to maximize function.    Personal Factors and Comorbidities Comorbidity 3+;Profession    Comorbidities anemia, arthritis, diverticulitis, GERD, hepatitis, and HTN    Examination-Activity Limitations Bathing;Caring for Others;Carry;Lift;Reach Overhead;Hygiene/Grooming    Examination-Participation Restrictions Church;Cleaning;Community Activity;Occupation;Meal Prep;Laundry;Driving    Stability/Clinical Decision Making Stable/Uncomplicated    Rehab Potential Good    PT Frequency 2x / week   1x/wk x 3 wks, then 2x/wk x 9 wks   PT Duration 12 weeks    PT Treatment/Interventions ADLs/Self Care Home Management;Cryotherapy;Electrical Stimulation;Moist Heat;Therapeutic exercise;Therapeutic activities;Functional mobility training;Neuromuscular re-education;Patient/family education;Manual techniques;Vasopneumatic Device;Taping;Dry needling;Passive range of motion    PT Next Visit Plan PROM with light AA exercise until 7/26; see what MD says - plan for strengthening and AROM HEP    PT Home Exercise Plan Access Code: [email or web address]    Consulted and Agree with Plan of Care Patient             Patient will  benefit from skilled therapeutic intervention in order to improve the following deficits and impairments:  Pain, Impaired UE functional use, Decreased strength, Decreased range of motion  Visit Diagnosis: Stiffness  of right shoulder, not elsewhere classified  Acute pain of right shoulder  Localized edema  Muscle weakness (generalized)     Problem List Patient Active Problem List   Diagnosis Date Noted   Shoulder arthritis    S/P shoulder replacement, 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

## 2020-07-27 NOTE — Progress Notes (Signed)
Post-Op Visit Note   Patient: Angela Villanueva           Date of Birth: 02/28/1955           MRN: [id number] Visit Date: 07/27/2020 PCP: Jordan Hawks, FNP   Assessment & Plan:  Chief Complaint:  Chief Complaint  Patient presents with   Right Shoulder - Routine Post Op   Visit Diagnoses:  1. Status post shoulder replacement, right     Plan: Patient is a 64 year old female who presents s/p right total shoulder arthroplasty on 06/16/2020.  She is 6 weeks out from procedure.  She states that her shoulder is feeling well but she does note some soreness in the anterior shoulder at times and complains of continued burping.  She states that she recently took omeprazole which resolved her burping symptoms completely.  She does endorse a history of GERD with prior EGD in the past and has taken omeprazole previously for about 1 year without any side effects.  She is only having to take pain medicine about once every other day.  She is going to see her new GI doctor on 08/31/2020 but would like a prescription for omeprazole until she follows up with her new gastroenterologist.  On exam incision is healing well without any evidence of infection or dehiscence.  She has excellent subscapularis strength.  Axillary nerve intact with deltoid firing.  She is able to actively lift her arm with passive motion equivalent to active motion.  30 degrees external rotation, 80 degrees abduction, 130 degrees forward flexion.  Plan to continue with physical therapy and follow-up in 6 weeks for final recheck with Dr. Marlou Sa.  She is a Secretary/administrator at [employer] and wants to ensure that she is able to lift her arm up without significant pain prior to returning to work.  Follow-Up Instructions: No follow-ups on file.   Orders:  No orders of the defined types were placed in this encounter.  No orders of the defined types were placed in this encounter.   Imaging: No results found.  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   Gastroparesis 09/01/2015   Abdominal pain, epigastric 01/22/2014   Esophageal reflux 05/02/2012   Dysphagia 05/02/2012   DUODENITIS, WITHOUT HEMORRHAGE 04/09/2007   Constipation 04/09/2007   History of peptic ulcer disease 04/09/2007   Past Medical History:  Diagnosis Date   Allergy    Anemia    Arthritis    Diverticulitis    Gastric ulcer    GERD (gastroesophageal reflux disease)    Hepatitis    Hypertension     Family History  Problem Relation Age of Onset   COPD Mother    Cancer Mother    Other Mother        blood cloth   Diabetes Other     Past Surgical History:  Procedure Laterality Date   ABDOMINAL HYSTERECTOMY     EYE SURGERY     fibroid tumor removal     FOOT SURGERY     LAPAROSCOPIC GASTROTOMY W/ REPAIR OF ULCER     PARTIAL HYSTERECTOMY     TONSILLECTOMY     AS CHILD   TOTAL SHOULDER ARTHROPLASTY Right 06/16/2020   Procedure: RIGHT TOTAL SHOULDER ARTHROPLASTY;  Surgeon: Meredith Pel, MD;  Location: [place];  Service: Orthopedics;  Laterality: Right;   TUBAL LIGATION     Social History  Occupational History   Occupation: Custodian    Employer: [employer]  Tobacco Use   Smoking status: Former    Types: Cigarettes    Quit date: 01/04/1992    Years since quitting: 28.5   Smokeless tobacco: Never  Vaping Use   Vaping Use: Never used  Substance and Sexual Activity   Alcohol use: No   Drug use: No   Sexual activity: Not Currently

## 2020-07-29 ENCOUNTER — Encounter: Payer: Self-pay | Admitting: Orthopedic Surgery

## 2020-08-03 ENCOUNTER — Other Ambulatory Visit: Payer: Self-pay

## 2020-08-03 ENCOUNTER — Encounter: Payer: Self-pay | Admitting: Physical Therapy

## 2020-08-03 ENCOUNTER — Encounter: Payer: No Typology Code available for payment source | Admitting: Physical Therapy

## 2020-08-03 ENCOUNTER — Ambulatory Visit: Payer: No Typology Code available for payment source | Admitting: Physical Therapy

## 2020-08-03 DIAGNOSIS — M25611 Stiffness of right shoulder, not elsewhere classified: Secondary | ICD-10-CM

## 2020-08-03 DIAGNOSIS — R600 Localized edema: Secondary | ICD-10-CM | POA: Diagnosis not present

## 2020-08-03 DIAGNOSIS — M25511 Pain in right shoulder: Secondary | ICD-10-CM | POA: Diagnosis not present

## 2020-08-03 DIAGNOSIS — M6281 Muscle weakness (generalized): Secondary | ICD-10-CM | POA: Diagnosis not present

## 2020-08-03 NOTE — Patient Instructions (Signed)
Access Code: [email or web address] URL: [email or web address] Date: 08/03/2020 Prepared by: Faustino Congress  Exercises Standing Shoulder Flexion AAROM with Dowel - 2-3 x daily - 7 x weekly - 1-2 sets - 10 reps Standing Shoulder External Rotation AAROM with Dowel - 2-3 x daily - 7 x weekly - 1-2 sets - 10 reps Standing Shoulder Abduction AAROM with Dowel - 2-3 x daily - 7 x weekly - 1-2 sets - 10 reps Supine Shoulder Flexion with Free Weight - 2 x daily - 7 x weekly - 2-3 sets - 10 reps Sidelying Shoulder ER with Towel and Dumbbell - 2 x daily - 7 x weekly - 2-3 sets - 10 reps Sidelying Shoulder Abduction Full Range of Motion with Dumbbell - 2 x daily - 7 x weekly - 2-3 sets - 10 reps Standing Row with Anchored Resistance - 1 x daily - 7 x weekly - 2-3 sets - 10 reps Shoulder External Rotation Reactive Isometrics - 1 x daily - 7 x weekly - 2-3 sets - 10 reps Shoulder Internal Rotation Reactive Isometrics - 1 x daily - 7 x weekly - 2-3 sets - 10 reps

## 2020-08-03 NOTE — Therapy (Signed)
[facility] Physical Therapy [address] [place], [place], [address] Phone: [phone]   Fax:  [phone]  Physical Therapy Treatment  Patient Details  Name: Angela Villanueva MRN: [id number] Date of Birth: 11-21-1955 Referring Provider (PT): Donella Stade, [place]   Encounter Date: 08/03/2020   PT End of Session - 08/03/20 1102     Visit Number 6    Number of Visits 21    Date for PT Re-Evaluation 09/24/20    Authorization Type [facility] - med necessity review after 25 visits    PT Start Time [id number]    PT Stop Time 1051    PT Time Calculation (min) 39 min    Activity Tolerance Patient tolerated treatment well;Patient limited by pain    Behavior During Therapy [facility] for tasks assessed/performed             Past Medical History:  Diagnosis Date   Allergy    Anemia    Arthritis    Diverticulitis    Gastric ulcer    GERD (gastroesophageal reflux disease)    Hepatitis    Hypertension     Past Surgical History:  Procedure Laterality Date   ABDOMINAL HYSTERECTOMY     EYE SURGERY     fibroid tumor removal     FOOT SURGERY     LAPAROSCOPIC GASTROTOMY W/ REPAIR OF ULCER     PARTIAL HYSTERECTOMY     TONSILLECTOMY     AS CHILD   TOTAL SHOULDER ARTHROPLASTY Right 06/16/2020   Procedure: RIGHT TOTAL SHOULDER ARTHROPLASTY;  Surgeon: Meredith Pel, MD;  Location: [facility];  Service: Orthopedics;  Laterality: Right;   TUBAL LIGATION      There were no vitals filed for this visit.   Subjective Assessment - 08/03/20 1013     Subjective Rt shoulder is sore, still reports the gas is still in her bicep    Limitations Lifting    Patient Stated Goals return to 100%, regain use of RUE    Pain Score 8     Pain Location Shoulder    Pain Orientation Right    Pain Descriptors / Indicators Aching    Pain Type Acute pain;Surgical pain    Pain Onset 1 to 4 weeks ago    Pain Frequency Intermittent    Aggravating Factors  end range, pain in bicep     Pain Relieving Factors aleve                               [facility] Adult PT Treatment/Exercise - 08/03/20 1020       Shoulder Exercises: Supine   Flexion Right;20 reps;Weights    Shoulder Flexion Weight (lbs) 1      Shoulder Exercises: Sidelying   External Rotation Right;20 reps;Weights    External Rotation Weight (lbs) 1    ABduction Right;20 reps;Weights    ABduction Weight (lbs) 1      Shoulder Exercises: Standing   External Rotation AAROM;Right;10 reps   1# bar   Flexion AAROM;10 reps   1# bar   ABduction AAROM;Right;10 reps   1# bar   Row Both;20 reps;Theraband    Theraband Level (Shoulder Row) Level 3 (Green)    Other Standing Exercises IR/ER walkout with L3 band x 20 reps                    PT Education - 08/03/20 1102     Education  Details updated HEP    Person(s) Educated Patient    Methods Explanation;Demonstration;Handout    Comprehension Verbalized understanding;Returned demonstration;Need further instruction              PT Short Term Goals - 07/13/20 1547       PT SHORT TERM GOAL #1   Title Independent with HEP    Time 3    Period Weeks    Status Achieved    Target Date 07/23/20               PT Long Term Goals - 07/08/20 1424       PT LONG TERM GOAL #1   Title Independent with final HEP    Time 12    Period Weeks    Status On-going      PT LONG TERM GOAL #2   Title Improve FOTO score to 64 for improved mobility    Time 12    Period Weeks    Status On-going      PT LONG TERM GOAL #3   Title Improve Rt shoulder AROM to [facility] for improved function and mobility    Time 12    Period Weeks    Status On-going      PT LONG TERM GOAL #4   Title Improve Rt shoulder strength to at least 4/5 for improved function    Time 12    Period Weeks    Status On-going      PT LONG TERM GOAL #5   Title report no pain with work simulated activities for improved function in anticipation of return to work    Time 12     Period Weeks    Status On-going                   Plan - 08/03/20 1103     Clinical Impression Statement Session today focused on progression of HEP to include AAROM and strengthening exercises.  Tolerated session well without increase in pain, but expected fatigue noted.  Will continue to benefit from PT.  She's requesting decrease in visits due to copay.    Personal Factors and Comorbidities Comorbidity 3+;Profession    Comorbidities anemia, arthritis, diverticulitis, GERD, hepatitis, and HTN    Examination-Activity Limitations Bathing;Caring for Others;Carry;Lift;Reach Overhead;Hygiene/Grooming    Examination-Participation Restrictions Church;Cleaning;Community Activity;Occupation;Meal Prep;Laundry;Driving    Stability/Clinical Decision Making Stable/Uncomplicated    Rehab Potential Good    PT Frequency 2x / week   1x/wk x 3 wks, then 2x/wk x 9 wks   PT Duration 12 weeks    PT Treatment/Interventions ADLs/Self Care Home Management;Cryotherapy;Electrical Stimulation;Moist Heat;Therapeutic exercise;Therapeutic activities;Functional mobility training;Neuromuscular re-education;Patient/family education;Manual techniques;Vasopneumatic Device;Taping;Dry needling;Passive range of motion    PT Next Visit Plan review/address new HEP, progress PRN    PT Home Exercise Plan Access Code: [email or web address]    Consulted and Agree with Plan of Care Patient             Patient will benefit from skilled therapeutic intervention in order to improve the following deficits and impairments:  Pain, Impaired UE functional use, Decreased strength, Decreased range of motion  Visit Diagnosis: Stiffness of right shoulder, not elsewhere classified  Acute pain of right shoulder  Localized edema  Muscle weakness (generalized)     Problem List Patient Active Problem List   Diagnosis Date Noted   Shoulder arthritis    S/P shoulder replacement, 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

## 2020-08-07 ENCOUNTER — Encounter: Payer: No Typology Code available for payment source | Admitting: Surgical

## 2020-08-10 ENCOUNTER — Encounter: Payer: No Typology Code available for payment source | Admitting: Physical Therapy

## 2020-08-10 ENCOUNTER — Telehealth: Payer: Self-pay | Admitting: Orthopedic Surgery

## 2020-08-10 NOTE — Telephone Encounter (Signed)
Pt wondering if she can put something cold or hot on her shoulder to help with the pain?   CB [phone]

## 2020-08-10 NOTE — Telephone Encounter (Signed)
Absolutely.

## 2020-08-11 NOTE — Telephone Encounter (Signed)
Patient has been contacted and made aware. She has no additional questions or concerns that she would like to discuss.

## 2020-08-12 ENCOUNTER — Encounter: Payer: No Typology Code available for payment source | Admitting: Orthopedic Surgery

## 2020-08-12 ENCOUNTER — Other Ambulatory Visit (HOSPITAL_COMMUNITY): Payer: Self-pay

## 2020-08-12 MED ORDER — PANTOPRAZOLE SODIUM 40 MG PO TBEC
40.0000 mg | DELAYED_RELEASE_TABLET | Freq: Every day | ORAL | 1 refills | Status: DC
  Filled 2020-08-12: qty 90, 90d supply, fill #0

## 2020-08-12 MED ORDER — PANTOPRAZOLE SODIUM 40 MG PO TBEC
DELAYED_RELEASE_TABLET | ORAL | 1 refills | Status: DC
  Filled 2020-08-12 (×2): qty 90, 90d supply, fill #0

## 2020-08-17 ENCOUNTER — Encounter: Payer: No Typology Code available for payment source | Admitting: Physical Therapy

## 2020-08-17 ENCOUNTER — Encounter: Payer: No Typology Code available for payment source | Admitting: Orthopedic Surgery

## 2020-08-24 ENCOUNTER — Encounter: Payer: Self-pay | Admitting: Physical Therapy

## 2020-08-24 ENCOUNTER — Ambulatory Visit: Payer: No Typology Code available for payment source | Admitting: Physical Therapy

## 2020-08-24 ENCOUNTER — Other Ambulatory Visit: Payer: Self-pay

## 2020-08-24 DIAGNOSIS — M25611 Stiffness of right shoulder, not elsewhere classified: Secondary | ICD-10-CM

## 2020-08-24 DIAGNOSIS — M25511 Pain in right shoulder: Secondary | ICD-10-CM

## 2020-08-24 DIAGNOSIS — M6281 Muscle weakness (generalized): Secondary | ICD-10-CM

## 2020-08-24 DIAGNOSIS — R600 Localized edema: Secondary | ICD-10-CM | POA: Diagnosis not present

## 2020-08-24 NOTE — Therapy (Signed)
[facility] Physical Therapy [address] [place], [place], [address] Phone: [phone]   Fax:  [phone]  Physical Therapy Treatment  Patient Details  Name: Angela Villanueva MRN: [id number] Date of Birth: [birth year] Referring Provider (PT): Donella Stade, [place]   Encounter Date: 08/24/2020   PT End of Session - 08/24/20 1007     Visit Number 7    Number of Visits 21    Date for PT Re-Evaluation 09/24/20    Authorization Type [facility] - med necessity review after 25 visits    PT Start Time 0922    PT Stop Time 1004    PT Time Calculation (min) 42 min    Activity Tolerance Patient tolerated treatment well;Patient limited by pain    Behavior During Therapy [facility] for tasks assessed/performed             Past Medical History:  Diagnosis Date   Allergy    Anemia    Arthritis    Diverticulitis    Gastric ulcer    GERD (gastroesophageal reflux disease)    Hepatitis    Hypertension     Past Surgical History:  Procedure Laterality Date   ABDOMINAL HYSTERECTOMY     EYE SURGERY     fibroid tumor removal     FOOT SURGERY     LAPAROSCOPIC GASTROTOMY W/ REPAIR OF ULCER     PARTIAL HYSTERECTOMY     TONSILLECTOMY     AS CHILD   TOTAL SHOULDER ARTHROPLASTY Right 06/16/2020   Procedure: RIGHT TOTAL SHOULDER ARTHROPLASTY;  Surgeon: Meredith Pel, MD;  Location: [facility];  Service: Orthopedics;  Laterality: Right;   TUBAL LIGATION      There were no vitals filed for this visit.   Subjective Assessment - 08/24/20 0922     Subjective "It's just sore all the time."    Limitations Lifting    Patient Stated Goals return to 100%, regain use of RUE    Currently in Pain? Yes    Pain Score 8     Pain Location Shoulder    Pain Orientation Right    Pain Descriptors / Indicators Sore    Pain Type Acute pain;Surgical pain    Pain Onset 1 to 4 weeks ago    Pain Frequency Intermittent    Aggravating Factors  end range    Pain Relieving Factors  aleve                               [facility] Adult PT Treatment/Exercise - 08/24/20 0926       Shoulder Exercises: Supine   Flexion Right;20 reps;Weights    Shoulder Flexion Weight (lbs) 1    Other Supine Exercises bicep curl x 20 reps; 1# on Rt      Shoulder Exercises: Sidelying   External Rotation Right;20 reps;Weights    External Rotation Weight (lbs) 1    ABduction Right;20 reps;Weights    ABduction Weight (lbs) 1      Shoulder Exercises: Standing   Flexion Both;AROM;10 reps   to 90 deg   ABduction AROM;Both;10 reps      Shoulder Exercises: ROM/Strengthening   UBE (Upper Arm Bike) L1 x 6 min (only able to tolerate backward x 1 min)                      PT Short Term Goals - 07/13/20 1547  PT SHORT TERM GOAL #1   Title Independent with HEP    Time 3    Period Weeks    Status Achieved    Target Date 07/23/20               PT Long Term Goals - 07/08/20 1424       PT LONG TERM GOAL #1   Title Independent with final HEP    Time 12    Period Weeks    Status On-going      PT LONG TERM GOAL #2   Title Improve FOTO score to 64 for improved mobility    Time 12    Period Weeks    Status On-going      PT LONG TERM GOAL #3   Title Improve Rt shoulder AROM to [facility] for improved function and mobility    Time 12    Period Weeks    Status On-going      PT LONG TERM GOAL #4   Title Improve Rt shoulder strength to at least 4/5 for improved function    Time 12    Period Weeks    Status On-going      PT LONG TERM GOAL #5   Title report no pain with work simulated activities for improved function in anticipation of return to work    Time 12    Period Weeks    Status On-going                   Plan - 08/24/20 1007     Clinical Impression Statement Pt with increased pain and soreness today so unable to progress current HEP.  Did encourage regular use of RUE without heavy lifting to maximize function of RUE at home.   Will continue to benefit from PT to maximize function.    Personal Factors and Comorbidities Comorbidity 3+;Profession    Comorbidities anemia, arthritis, diverticulitis, GERD, hepatitis, and HTN    Examination-Activity Limitations Bathing;Caring for Others;Carry;Lift;Reach Overhead;Hygiene/Grooming    Examination-Participation Restrictions Church;Cleaning;Community Activity;Occupation;Meal Prep;Laundry;Driving    Stability/Clinical Decision Making Stable/Uncomplicated    Rehab Potential Good    PT Frequency 2x / week   1x/wk x 3 wks, then 2x/wk x 9 wks   PT Duration 12 weeks    PT Treatment/Interventions ADLs/Self Care Home Management;Cryotherapy;Electrical Stimulation;Moist Heat;Therapeutic exercise;Therapeutic activities;Functional mobility training;Neuromuscular re-education;Patient/family education;Manual techniques;Vasopneumatic Device;Taping;Dry needling;Passive range of motion    PT Next Visit Plan progress strengthening HEP PRN, continue with current POC    PT Home Exercise Plan Access Code: [email or web address]    Consulted and Agree with Plan of Care Patient             Patient will benefit from skilled therapeutic intervention in order to improve the following deficits and impairments:  Pain, Impaired UE functional use, Decreased strength, Decreased range of motion  Visit Diagnosis: Stiffness of right shoulder, not elsewhere classified  Acute pain of right shoulder  Localized edema  Muscle weakness (generalized)     Problem List Patient Active Problem List   Diagnosis Date Noted   Shoulder arthritis    S/P shoulder replacement, 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  [place], [place], [address] Phone: [phone]   Fax:  [phone]  Name: Angela Villanueva MRN: [id number] Date of Birth: 1955/11/08

## 2020-08-25 ENCOUNTER — Other Ambulatory Visit (HOSPITAL_COMMUNITY): Payer: Self-pay

## 2020-08-26 ENCOUNTER — Encounter: Payer: No Typology Code available for payment source | Admitting: Orthopedic Surgery

## 2020-08-26 ENCOUNTER — Telehealth: Payer: Self-pay | Admitting: Orthopedic Surgery

## 2020-08-26 NOTE — Telephone Encounter (Signed)
[place] forms received. To [employer]. ?

## 2020-08-28 ENCOUNTER — Other Ambulatory Visit: Payer: Self-pay

## 2020-08-28 ENCOUNTER — Encounter: Payer: Self-pay | Admitting: Orthopedic Surgery

## 2020-08-28 ENCOUNTER — Ambulatory Visit (INDEPENDENT_AMBULATORY_CARE_PROVIDER_SITE_OTHER): Payer: No Typology Code available for payment source | Admitting: Orthopedic Surgery

## 2020-08-28 DIAGNOSIS — Z96611 Presence of right artificial shoulder joint: Secondary | ICD-10-CM

## 2020-08-28 NOTE — Progress Notes (Signed)
   Post-Op Visit Note   Patient: Angela Villanueva           Date of Birth: 20-Nov-1955           MRN: [id number] Visit Date: 08/28/2020 PCP: Angela Hawks, FNP   Assessment & Plan:  Chief Complaint:  Chief Complaint  Patient presents with   Other    right total shoulder arthroplasty on 06/16/2020   Visit Diagnoses:  1. Status post shoulder replacement, right     Plan: Angela Villanueva is a 65 year old patient who underwent right reverse shoulder replacement about 8 weeks ago.  She is doing well overall but still has some pain with range of motion.  She is in physical therapy.  Taking Aleve for pain.  On exam she has pretty good subscap strength in external rotation strength.  Deltoid is functional.  I think she is going to need about 8 more weeks of recovery time in therapy to get strength back and functionality back to be able to do a fairly vigorous job.  Follow-Up Instructions: No follow-ups on file.   Orders:  No orders of the defined types were placed in this encounter.  No orders of the defined types were placed in this encounter.   Imaging: No results found.  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   Gastroparesis 09/01/2015   Abdominal pain, epigastric 01/22/2014   Esophageal reflux 05/02/2012   Dysphagia 05/02/2012   DUODENITIS, WITHOUT HEMORRHAGE 04/09/2007   Constipation 04/09/2007   History of peptic ulcer disease 04/09/2007   Past Medical History:  Diagnosis Date   Allergy    Anemia    Arthritis    Diverticulitis    Gastric ulcer    GERD (gastroesophageal reflux disease)    Hepatitis    Hypertension     Family History  Problem Relation Age of Onset   COPD Mother    Cancer Mother    Other Mother        blood cloth   Diabetes Other     Past Surgical History:  Procedure Laterality Date   ABDOMINAL HYSTERECTOMY     EYE SURGERY     fibroid tumor removal      FOOT SURGERY     LAPAROSCOPIC GASTROTOMY W/ REPAIR OF ULCER     PARTIAL HYSTERECTOMY     TONSILLECTOMY     AS CHILD   TOTAL SHOULDER ARTHROPLASTY Right 06/16/2020   Procedure: RIGHT TOTAL SHOULDER ARTHROPLASTY;  Surgeon: Meredith Pel, MD;  Location: [facility];  Service: Orthopedics;  Laterality: Right;   TUBAL LIGATION     Social History   Occupational History   Occupation: Custodian    Employer: [employer]  Tobacco Use   Smoking status: Former    Types: Cigarettes    Quit date: 01/04/1992    Years since quitting: 28.6   Smokeless tobacco: Never  Vaping Use   Vaping Use: Never used  Substance and Sexual Activity   Alcohol use: No   Drug use: No   Sexual activity: Not Currently

## 2020-09-03 ENCOUNTER — Telehealth: Payer: Self-pay | Admitting: Orthopedic Surgery

## 2020-09-03 NOTE — Telephone Encounter (Signed)
Pt submitted medical release form, short term disability forms, and $25.00 cash payment to [employer]. Accepted 09/03/20

## 2020-09-08 ENCOUNTER — Telehealth: Payer: Self-pay | Admitting: Orthopedic Surgery

## 2020-09-09 ENCOUNTER — Encounter: Payer: No Typology Code available for payment source | Admitting: Orthopedic Surgery

## 2020-09-14 ENCOUNTER — Encounter: Payer: No Typology Code available for payment source | Admitting: Orthopedic Surgery

## 2020-09-14 ENCOUNTER — Encounter: Payer: Self-pay | Admitting: Physical Therapy

## 2020-09-14 ENCOUNTER — Ambulatory Visit: Payer: No Typology Code available for payment source | Admitting: Physical Therapy

## 2020-09-14 ENCOUNTER — Other Ambulatory Visit: Payer: Self-pay

## 2020-09-14 DIAGNOSIS — M25611 Stiffness of right shoulder, not elsewhere classified: Secondary | ICD-10-CM

## 2020-09-14 DIAGNOSIS — M6281 Muscle weakness (generalized): Secondary | ICD-10-CM

## 2020-09-14 DIAGNOSIS — M25511 Pain in right shoulder: Secondary | ICD-10-CM | POA: Diagnosis not present

## 2020-09-14 DIAGNOSIS — R600 Localized edema: Secondary | ICD-10-CM

## 2020-09-14 NOTE — Therapy (Signed)
[facility] Physical Therapy [address] [place], [place], [address] Phone: [phone]   Fax:  [phone]  Physical Therapy Treatment  Patient Details  Name: Angela Villanueva MRN: [id number] Date of Birth: 01-11-1955 Referring Provider (PT): Donella Stade, [place]   Encounter Date: 09/14/2020   PT End of Session - 09/14/20 1134     Visit Number 8    Number of Visits 21    Date for PT Re-Evaluation 09/24/20    Authorization Type [facility] - med necessity review after 25 visits    PT Start Time 0939   pt arrived late   PT Stop Time 1012    PT Time Calculation (min) 33 min    Activity Tolerance Patient tolerated treatment well;Patient limited by pain    Behavior During Therapy [facility] for tasks assessed/performed             Past Medical History:  Diagnosis Date   Allergy    Anemia    Arthritis    Diverticulitis    Gastric ulcer    GERD (gastroesophageal reflux disease)    Hepatitis    Hypertension     Past Surgical History:  Procedure Laterality Date   ABDOMINAL HYSTERECTOMY     EYE SURGERY     fibroid tumor removal     FOOT SURGERY     LAPAROSCOPIC GASTROTOMY W/ REPAIR OF ULCER     PARTIAL HYSTERECTOMY     TONSILLECTOMY     AS CHILD   TOTAL SHOULDER ARTHROPLASTY Right 06/16/2020   Procedure: RIGHT TOTAL SHOULDER ARTHROPLASTY;  Surgeon: Meredith Pel, MD;  Location: [place];  Service: Orthopedics;  Laterality: Right;   TUBAL LIGATION      There were no vitals filed for this visit.   Subjective Assessment - 09/14/20 0939     Subjective "My shoulder just aches all the time. I can't go back to work with it aching."    Limitations Lifting    Patient Stated Goals return to 100%, regain use of RUE    Currently in Pain? Yes    Pain Score 8     Pain Location Shoulder    Pain Orientation Right    Pain Descriptors / Indicators Sore    Pain Type Surgical pain;Acute pain    Pain Onset 1 to 4 weeks ago    Pain Frequency Intermittent     Aggravating Factors  end ranges, constant    Pain Relieving Factors aleve                [facility] PT Assessment - 09/14/20 0941       Assessment   Medical Diagnosis Z96.611 (ICD-10-CM) - History of arthroplasty of right shoulder    Referring Provider (PT) Magnant, Gerrianne Scale, PA-C    Onset Date/Surgical Date 06/16/20    Next MD Visit 10/12/20      Observation/Other Assessments   Focus on Therapeutic Outcomes (FOTO)  41 (previously 70, initial intake 40; predicted 64)      ROM / Strength   AROM / PROM / Strength AROM      AROM   Overall AROM Comments flex and abdct measured in sitting    AROM Assessment Site Shoulder    Right/Left Shoulder Right    Right Shoulder Flexion 87 Degrees    Right Shoulder ABduction 86 Degrees    Right Shoulder Internal Rotation 63 Degrees   in 35 deg abdct   Right Shoulder External Rotation 25 Degrees   35  deg abdct     PROM   Right Shoulder Flexion 82 Degrees    Right Shoulder ABduction 92 Degrees    Right Shoulder Internal Rotation 63 Degrees   35 deg abdct, limited due to abdomen   Right Shoulder External Rotation 31 Degrees   in 35 deg abdct                          [facility] Adult PT Treatment/Exercise - 09/14/20 0001       Self-Care   Self-Care Other Self-Care Comments    Other Self-Care Comments  discussed with pt current objective findings as well as FOTO score and limited progress noted at this time.  Unsure of reason for decrease in AROM at this time despite continued elevated levels of pain.  Did discuss working through some discomfort and that recovery will not be pain free as pt has demonstrated some fear avoidance behaviors and will stop PT with PROM movements due to pain.  Also discussed return to work requirements and modifications - pt reporting unable to make modifications (ex, turning body to help with keeping activities in less shoulder AROM)      Shoulder Exercises: Supine   Flexion AAROM;10 reps   1# bar      Manual Therapy   Passive ROM Rt shoulder all motions to tolerance - resisting end ranges today which was less than prior visit                     PT Education - 09/14/20 1134     Education Details see self care    Person(s) Educated Patient    Methods Explanation    Comprehension Verbalized understanding              PT Short Term Goals - 07/13/20 1547       PT SHORT TERM GOAL #1   Title Independent with HEP    Time 3    Period Weeks    Status Achieved    Target Date 07/23/20               PT Long Term Goals - 07/08/20 1424       PT LONG TERM GOAL #1   Title Independent with final HEP    Time 12    Period Weeks    Status On-going      PT LONG TERM GOAL #2   Title Improve FOTO score to 64 for improved mobility    Time 12    Period Weeks    Status On-going      PT LONG TERM GOAL #3   Title Improve Rt shoulder AROM to [facility] for improved function and mobility    Time 12    Period Weeks    Status On-going      PT LONG TERM GOAL #4   Title Improve Rt shoulder strength to at least 4/5 for improved function    Time 12    Period Weeks    Status On-going      PT LONG TERM GOAL #5   Title report no pain with work simulated activities for improved function in anticipation of return to work    Time 12    Period Weeks    Status On-going                   Plan - 09/14/20 1135     Clinical Impression Statement Pt continues to  report high levels of constant soreness in Rt shoulder.  Today demonstrating decreased AROM compared to prior session, and PROM actually less than AROM today.  Her FOTO score is decreased from prior assessment and at this time showing very limited change in functional abilities compared to first visit.  Unsure of cause of decreased ROM at this time, but constant soreness may be contributing to loss.  Will plan to see up to next MD appt and determine next steps.    Personal Factors and Comorbidities Comorbidity  3+;Profession    Comorbidities anemia, arthritis, diverticulitis, GERD, hepatitis, and HTN    Examination-Activity Limitations Bathing;Caring for Others;Carry;Lift;Reach Overhead;Hygiene/Grooming    Examination-Participation Restrictions Church;Cleaning;Community Activity;Occupation;Meal Prep;Laundry;Driving    Stability/Clinical Decision Making Stable/Uncomplicated    Rehab Potential Good    PT Frequency 2x / week   1x/wk x 3 wks, then 2x/wk x 9 wks   PT Duration 12 weeks    PT Treatment/Interventions ADLs/Self Care Home Management;Cryotherapy;Electrical Stimulation;Moist Heat;Therapeutic exercise;Therapeutic activities;Functional mobility training;Neuromuscular re-education;Patient/family education;Manual techniques;Vasopneumatic Device;Taping;Dry needling;Passive range of motion    PT Next Visit Plan progress strengthening HEP PRN, continue with current POC as able, work on restoring as much motion as able    PT Home Exercise Plan Access Code: [email or web address]    Consulted and Agree with Plan of Care Patient             Patient will benefit from skilled therapeutic intervention in order to improve the following deficits and impairments:  Pain, Impaired UE functional use, Decreased strength, Decreased range of motion  Visit Diagnosis: Stiffness of right shoulder, not elsewhere classified  Acute pain of right shoulder  Localized edema  Muscle weakness (generalized)     Problem List Patient Active Problem List   Diagnosis Date Noted   Shoulder arthritis    S/P shoulder replacement, 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 [place], [place],  [address] Phone: [phone]   Fax:  [phone]  Name: Angela Villanueva MRN: [id number] Date of Birth: [birth year]

## 2020-09-21 ENCOUNTER — Ambulatory Visit: Payer: No Typology Code available for payment source | Admitting: Rehabilitative and Restorative Service Providers"

## 2020-09-21 ENCOUNTER — Other Ambulatory Visit: Payer: Self-pay

## 2020-09-21 ENCOUNTER — Encounter: Payer: Self-pay | Admitting: Rehabilitative and Restorative Service Providers"

## 2020-09-21 DIAGNOSIS — R600 Localized edema: Secondary | ICD-10-CM | POA: Diagnosis not present

## 2020-09-21 DIAGNOSIS — M25611 Stiffness of right shoulder, not elsewhere classified: Secondary | ICD-10-CM | POA: Diagnosis not present

## 2020-09-21 DIAGNOSIS — M25511 Pain in right shoulder: Secondary | ICD-10-CM | POA: Diagnosis not present

## 2020-09-21 DIAGNOSIS — M6281 Muscle weakness (generalized): Secondary | ICD-10-CM | POA: Diagnosis not present

## 2020-09-21 NOTE — Therapy (Signed)
[facility] Physical Therapy [address] [place], [place], [address] Phone: [phone]   Fax:  [phone]  Physical Therapy Treatment  Patient Details  Name: Angela Villanueva MRN: [id number] Date of Birth: 1955-03-25 Referring Provider (PT): Donella Stade, [place]   Encounter Date: 09/21/2020   PT End of Session - 09/21/20 0912     Visit Number 9    Number of Visits 21    Date for PT Re-Evaluation 09/24/20    Authorization Type [facility] - med necessity review after 25 visits    Progress Note Due on Visit 10    PT Start Time 0840    PT Stop Time 0919    PT Time Calculation (min) 39 min    Activity Tolerance Patient limited by pain    Behavior During Therapy [facility] for tasks assessed/performed             Past Medical History:  Diagnosis Date   Allergy    Anemia    Arthritis    Diverticulitis    Gastric ulcer    GERD (gastroesophageal reflux disease)    Hepatitis    Hypertension     Past Surgical History:  Procedure Laterality Date   ABDOMINAL HYSTERECTOMY     EYE SURGERY     fibroid tumor removal     FOOT SURGERY     LAPAROSCOPIC GASTROTOMY W/ REPAIR OF ULCER     PARTIAL HYSTERECTOMY     TONSILLECTOMY     AS CHILD   TOTAL SHOULDER ARTHROPLASTY Right 06/16/2020   Procedure: RIGHT TOTAL SHOULDER ARTHROPLASTY;  Surgeon: Meredith Pel, MD;  Location: [place];  Service: Orthopedics;  Laterality: Right;   TUBAL LIGATION      There were no vitals filed for this visit.   Subjective Assessment - 09/21/20 0842     Subjective Pt. stated Rt shoulder feels sore into upper arm.  Pt. stated several times that she had soreness all over body, 7/10 that comes and goes.  Stated it wasn't there prior to surgery.    Limitations Lifting    Patient Stated Goals return to 100%, regain use of RUE    Currently in Pain? Yes    Pain Score 7     Pain Location Shoulder    Pain Orientation Right    Pain Descriptors / Indicators Aching;Sore    Pain  Onset More than a month ago    Pain Frequency Intermittent    Aggravating Factors  general soreness, sometimes worse with movement    Pain Relieving Factors nothing specifc reported.                [facility] PT Assessment - 09/21/20 0001       Assessment   Medical Diagnosis Z96.611 (ICD-10-CM) - History of arthroplasty of right shoulder    Referring Provider (PT) Magnant, Gerrianne Scale, PA-C    Onset Date/Surgical Date 06/16/20    Hand Dominance Right      AROM   Right Shoulder Flexion 85 Degrees   in supine     PROM   Right Shoulder Flexion 90 Degrees   in supine                          [facility] Adult PT Treatment/Exercise - 09/21/20 0001       Shoulder Exercises: Supine   Flexion AAROM;Right   2 x 10 1 lb bar   Other Supine Exercises supine active shoulder flexion  x 10 Rt      Shoulder Exercises: Standing   Row Both   2 x 10 green   Other Standing Exercises eccentric Rt shoulder ER c towel at side and clinican movement to start point and holding trunk still x 12 green band      Shoulder Exercises: ROM/Strengthening   UBE (Upper Arm Bike) Lvl 1 3 mins fwd                       PT Short Term Goals - 07/13/20 1547       PT SHORT TERM GOAL #1   Title Independent with HEP    Time 3    Period Weeks    Status Achieved    Target Date 07/23/20               PT Long Term Goals - 09/21/20 0903       PT LONG TERM GOAL #1   Title Independent with final HEP    Time 12    Period Weeks    Status On-going    Target Date 09/24/20      PT LONG TERM GOAL #2   Title Improve FOTO score to 64 for improved mobility    Time 12    Period Weeks    Status On-going    Target Date 09/24/20      PT LONG TERM GOAL #3   Title Improve Rt shoulder AROM to [facility] for improved function and mobility    Time 12    Period Weeks    Status On-going    Target Date 09/24/20      PT LONG TERM GOAL #4   Title Improve Rt shoulder strength to at least 4/5 for  improved function    Time 12    Period Weeks    Status On-going    Target Date 09/24/20      PT LONG TERM GOAL #5   Title report no pain with work simulated activities for improved function in anticipation of return to work    Time 12    Period Weeks    Status On-going    Target Date 09/24/20                   Plan - 09/21/20 0900     Clinical Impression Statement Rt shoulder AROM/PROM limited primarily due to pain symptoms and guarding to movement.  No specific joint mobility restriction noted in allowed range at this time per Pt. symptoms.  Gross limitation in active and passive movement still noted.  Pt. has reported continued overall body soreness, not specifically just Rt shoulder soreness to this pont.  Continued instances of communication and observed gas(belching).  Initially observed in supine and reduced in standing but still noted at times in standing.    Personal Factors and Comorbidities Comorbidity 3+;Profession    Comorbidities anemia, arthritis, diverticulitis, GERD, hepatitis, and HTN    Examination-Activity Limitations Bathing;Caring for Others;Carry;Lift;Reach Overhead;Hygiene/Grooming    Examination-Participation Restrictions Church;Cleaning;Community Activity;Occupation;Meal Prep;Laundry;Driving    Stability/Clinical Decision Making Stable/Uncomplicated    Rehab Potential Good    PT Frequency 2x / week   1x/wk x 3 wks, then 2x/wk x 9 wks   PT Duration 12 weeks    PT Treatment/Interventions ADLs/Self Care Home Management;Cryotherapy;Electrical Stimulation;Moist Heat;Therapeutic exercise;Therapeutic activities;Functional mobility training;Neuromuscular re-education;Patient/family education;Manual techniques;Vasopneumatic Device;Taping;Dry needling;Passive range of motion    PT Next Visit Plan Reassessment with new cert required around 09/24/2020.  10th  visit PN required next visit    PT Home Exercise Plan Access Code: [email or web address]    Consulted and Agree with Plan  of Care Patient             Patient will benefit from skilled therapeutic intervention in order to improve the following deficits and impairments:  Pain, Impaired UE functional use, Decreased strength, Decreased range of motion  Visit Diagnosis: Stiffness of right shoulder, not elsewhere classified  Acute pain of right shoulder  Localized edema  Muscle weakness (generalized)     Problem List Patient Active Problem List   Diagnosis Date Noted   Shoulder arthritis    S/P shoulder replacement, 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 [place], [place], [address] Phone: [phone]   Fax:  [phone]  Name: Angela Villanueva MRN: [id number] Date of Birth: 1955-12-03

## 2020-09-22 ENCOUNTER — Telehealth: Payer: Self-pay | Admitting: Orthopedic Surgery

## 2020-09-22 NOTE — Telephone Encounter (Signed)
Last ov note & oow note faxed to [facility] [phone]

## 2020-09-25 ENCOUNTER — Other Ambulatory Visit: Payer: Self-pay | Admitting: Surgical

## 2020-09-25 ENCOUNTER — Other Ambulatory Visit (HOSPITAL_COMMUNITY): Payer: Self-pay

## 2020-09-25 ENCOUNTER — Telehealth: Payer: Self-pay | Admitting: Orthopedic Surgery

## 2020-09-25 MED ORDER — FAMOTIDINE 20 MG PO TABS
20.0000 mg | ORAL_TABLET | Freq: Two times a day (BID) | ORAL | 0 refills | Status: AC
  Filled 2020-09-25: qty 30, 15d supply, fill #0

## 2020-09-25 MED ORDER — CELECOXIB 100 MG PO CAPS
100.0000 mg | ORAL_CAPSULE | Freq: Two times a day (BID) | ORAL | 0 refills | Status: DC
  Filled 2020-09-25: qty 60, 30d supply, fill #0

## 2020-09-25 MED ORDER — ACETAMINOPHEN ER 650 MG PO TBCR
650.0000 mg | EXTENDED_RELEASE_TABLET | Freq: Three times a day (TID) | ORAL | 0 refills | Status: DC | PRN
  Filled 2020-09-25: qty 60, 20d supply, fill #0

## 2020-09-25 NOTE — Telephone Encounter (Signed)
I called patient and advised. She is also requesting something for pain in shoulder as well. She has been taking aleve with no relief. She states that she needs something stronger. She requests this be sent in to [place].

## 2020-09-25 NOTE — Telephone Encounter (Signed)
Patient called asked if she can get something called into her pharmacy for pain and the gas she is having. Patient said she is burping and her stomach is very bloated.  Patient uses [facility] The number to contact patient is [phone]

## 2020-09-25 NOTE — Telephone Encounter (Signed)
Prescribed celebrex with tylenol.  Opioid medication should be discontinued at this point 3 months out from surgery.  Let us know if any continuing issues but she does have upcoming appt so we can discuss more then

## 2020-09-25 NOTE — Telephone Encounter (Signed)
Please advise 

## 2020-09-25 NOTE — Telephone Encounter (Signed)
I called patient and advised. 

## 2020-09-25 NOTE — Telephone Encounter (Signed)
Rxed famotidine, if further issues, recommend reach out to PCP office

## 2020-09-28 ENCOUNTER — Telehealth: Payer: Self-pay | Admitting: Orthopedic Surgery

## 2020-09-28 ENCOUNTER — Ambulatory Visit: Payer: No Typology Code available for payment source | Admitting: Physical Therapy

## 2020-09-28 ENCOUNTER — Other Ambulatory Visit: Payer: Self-pay

## 2020-09-28 ENCOUNTER — Encounter: Payer: Self-pay | Admitting: Physical Therapy

## 2020-09-28 DIAGNOSIS — M25511 Pain in right shoulder: Secondary | ICD-10-CM | POA: Diagnosis not present

## 2020-09-28 DIAGNOSIS — R600 Localized edema: Secondary | ICD-10-CM | POA: Diagnosis not present

## 2020-09-28 DIAGNOSIS — M6281 Muscle weakness (generalized): Secondary | ICD-10-CM | POA: Diagnosis not present

## 2020-09-28 DIAGNOSIS — M25611 Stiffness of right shoulder, not elsewhere classified: Secondary | ICD-10-CM | POA: Diagnosis not present

## 2020-09-28 NOTE — Telephone Encounter (Signed)
MD notes & P.T. notes 08/29/20 - present faxed to [facility]

## 2020-09-28 NOTE — Therapy (Signed)
[facility] Physical Therapy [address] [place], [place], [address] Phone: [phone]   Fax:  [phone]  Physical Therapy Treatment/Recertification  Patient Details  Name: Angela Villanueva MRN: [id number] Date of Birth: 1955/12/24 Referring Provider (PT): Donella Stade, [place]   Encounter Date: 09/28/2020   PT End of Session - 09/28/20 0911     Visit Number 10    Number of Visits 21    Date for PT Re-Evaluation 10/26/20    Authorization Type [facility] - med necessity review after 25 visits    PT Start Time 0818   session limited due to disability paperwork needing to be completed - pt requesting assistance from PT   PT Stop Time 0844    PT Time Calculation (min) 26 min    Activity Tolerance Patient limited by pain    Behavior During Therapy [facility] for tasks assessed/performed             Past Medical History:  Diagnosis Date   Allergy    Anemia    Arthritis    Diverticulitis    Gastric ulcer    GERD (gastroesophageal reflux disease)    Hepatitis    Hypertension     Past Surgical History:  Procedure Laterality Date   ABDOMINAL HYSTERECTOMY     EYE SURGERY     fibroid tumor removal     FOOT SURGERY     LAPAROSCOPIC GASTROTOMY W/ REPAIR OF ULCER     PARTIAL HYSTERECTOMY     TONSILLECTOMY     AS CHILD   TOTAL SHOULDER ARTHROPLASTY Right 06/16/2020   Procedure: RIGHT TOTAL SHOULDER ARTHROPLASTY;  Surgeon: Meredith Pel, MD;  Location: [place];  Service: Orthopedics;  Laterality: Right;   TUBAL LIGATION      There were no vitals filed for this visit.   Subjective Assessment - 09/28/20 0819     Subjective Rt shoulder is sore.  first part of session spent helping to fill out disability forms.  gas in shoulder is still causing all this pain.    Limitations Lifting    Patient Stated Goals return to 100%, regain use of RUE    Currently in Pain? Yes    Pain Score 7     Pain Location Shoulder    Pain Orientation Right    Pain  Descriptors / Indicators Aching;Sore    Pain Type Acute pain;Surgical pain    Pain Onset More than a month ago    Pain Frequency Intermittent    Aggravating Factors  general soreness, worse with movement    Pain Relieving Factors walking helps soreness                [facility] PT Assessment - 09/28/20 0001       Assessment   Medical Diagnosis Z96.611 (ICD-10-CM) - History of arthroplasty of right shoulder    Referring Provider (PT) Magnant, Gerrianne Scale, PA-C    Onset Date/Surgical Date 06/16/20      Observation/Other Assessments   Focus on Therapeutic Outcomes (FOTO)  41 (previously 44, initial intake 40; predicted 64)      AROM   Right Shoulder Flexion --   65-88 AAROM in standing   Right Shoulder ABduction --   in standing AA 80 deg                          [facility] Adult PT Treatment/Exercise - 09/28/20 0821       Shoulder Exercises:  Standing   Flexion AAROM;Both;20 reps   1# bar   Flexion Limitations --   Rt shoulder 65-88 deg   ABduction AAROM;Right;20 reps   1# bar   Row Both;20 reps;Theraband      Shoulder Exercises: ROM/Strengthening   UBE (Upper Arm Bike) Lvl 1 6 min (3 min each way) - 1-2 RPMs noted with rest breaks due to pain                       PT Short Term Goals - 07/13/20 1547       PT SHORT TERM GOAL #1   Title Independent with HEP    Time 3    Period Weeks    Status Achieved    Target Date 07/23/20               PT Long Term Goals - 09/28/20 0912       PT LONG TERM GOAL #1   Title Independent with final HEP    Time 4    Period Weeks    Status On-going    Target Date 10/26/20      PT LONG TERM GOAL #2   Title Improve FOTO score to 64 for improved mobility    Time 4    Period Weeks    Status On-going    Target Date 10/26/20      PT LONG TERM GOAL #3   Title Improve Rt shoulder AROM to [facility] for improved function and mobility    Time 4    Period Weeks    Status On-going    Target Date 10/26/20       PT LONG TERM GOAL #4   Title Improve Rt shoulder strength to at least 4/5 for improved function    Time 4    Period Weeks    Status On-going    Target Date 10/26/20      PT LONG TERM GOAL #5   Title report no pain with work simulated activities for improved function in anticipation of return to work    Time 4    Period Weeks    Status On-going                   Plan - 09/28/20 1127     Clinical Impression Statement Pt continues to report increased pain and still c/o gas throughout body that she feels is causing her pain and limiting her progress with PT.  She was recently prescribed a new reflux medication and is hopeful this will help.  Standing AAROM flexion only between 65 and 88 deg today, demonstrating decline in flexion ROM at this time.  May need to follow back up with MD if she continues to demonstrate decline in ROM, which she has f/u appt scheduled.    Personal Factors and Comorbidities Comorbidity 3+;Profession    Comorbidities anemia, arthritis, diverticulitis, GERD, hepatitis, and HTN    Examination-Activity Limitations Bathing;Caring for Others;Carry;Lift;Reach Overhead;Hygiene/Grooming    Examination-Participation Restrictions Church;Cleaning;Community Activity;Occupation;Meal Prep;Laundry;Driving    Stability/Clinical Decision Making Stable/Uncomplicated    Rehab Potential Good    PT Frequency 1x / week   1x/wk x 3 wks, then 2x/wk x 9 wks   PT Duration 4 weeks    PT Treatment/Interventions ADLs/Self Care Home Management;Cryotherapy;Electrical Stimulation;Moist Heat;Therapeutic exercise;Therapeutic activities;Functional mobility training;Neuromuscular re-education;Patient/family education;Manual techniques;Vasopneumatic Device;Taping;Dry needling;Passive range of motion    PT Next Visit Plan continue 1x/wk x 4 weeks - still working on ROM and strengthening as  able    PT Home Exercise Plan Access Code: [email or web address]    Consulted and Agree with Plan of Care Patient              Patient will benefit from skilled therapeutic intervention in order to improve the following deficits and impairments:  Pain, Impaired UE functional use, Decreased strength, Decreased range of motion  Visit Diagnosis: Stiffness of right shoulder, not elsewhere classified - Plan: PT plan of care cert/re-cert  Acute pain of right shoulder - Plan: PT plan of care cert/re-cert  Localized edema - Plan: PT plan of care cert/re-cert  Muscle weakness (generalized) - Plan: PT plan of care cert/re-cert     Problem List Patient Active Problem List   Diagnosis Date Noted   Shoulder arthritis    S/P shoulder replacement, 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 Physical Therapy [address] [place], [place], [address] Phone: [phone]   Fax:  [phone]  Name: Angela Villanueva MRN: [id number] Date of Birth: [birth year]

## 2020-10-01 ENCOUNTER — Telehealth: Payer: Self-pay | Admitting: Orthopedic Surgery

## 2020-10-01 NOTE — Telephone Encounter (Signed)
Patient called,spoke with [employer]. Wants records refaxed to [facility] as was faxed 9/26. I refaxed [phone], claim number [id number]

## 2020-10-05 ENCOUNTER — Other Ambulatory Visit: Payer: Self-pay

## 2020-10-05 ENCOUNTER — Ambulatory Visit: Payer: No Typology Code available for payment source | Admitting: Physical Therapy

## 2020-10-05 ENCOUNTER — Telehealth: Payer: Self-pay | Admitting: Physical Therapy

## 2020-10-05 ENCOUNTER — Encounter: Payer: Self-pay | Admitting: Physical Therapy

## 2020-10-05 DIAGNOSIS — M6281 Muscle weakness (generalized): Secondary | ICD-10-CM | POA: Diagnosis not present

## 2020-10-05 DIAGNOSIS — M25611 Stiffness of right shoulder, not elsewhere classified: Secondary | ICD-10-CM | POA: Diagnosis not present

## 2020-10-05 DIAGNOSIS — M25511 Pain in right shoulder: Secondary | ICD-10-CM

## 2020-10-05 DIAGNOSIS — R600 Localized edema: Secondary | ICD-10-CM | POA: Diagnosis not present

## 2020-10-05 NOTE — Telephone Encounter (Signed)
All P.T. notes 07/02/20-present faxed to [facility] no [phone]

## 2020-10-05 NOTE — Therapy (Addendum)
[facility] Physical Therapy [address] [place], [place], [address] Phone: [phone]   Fax:  [phone]  Physical Therapy Treatment [place]   Patient Details  Name: Angela Villanueva MRN: [id number] Date of Birth: [birth year] Referring Provider (PT): Donella Stade, [place]   Encounter Date: 10/05/2020   PT End of Session - 10/05/20 7989     Visit Number 11    Number of Visits 21    Date for PT Re-Evaluation 10/26/20    Authorization Type [facility] - med necessity review after 25 visits    PT Start Time 0800    PT Stop Time 0840    PT Time Calculation (min) 40 min    Activity Tolerance Patient limited by pain    Behavior During Therapy [facility] for tasks assessed/performed             Past Medical History:  Diagnosis Date   Allergy    Anemia    Arthritis    Diverticulitis    Gastric ulcer    GERD (gastroesophageal reflux disease)    Hepatitis    Hypertension     Past Surgical History:  Procedure Laterality Date   ABDOMINAL HYSTERECTOMY     EYE SURGERY     fibroid tumor removal     FOOT SURGERY     LAPAROSCOPIC GASTROTOMY W/ REPAIR OF ULCER     PARTIAL HYSTERECTOMY     TONSILLECTOMY     AS CHILD   TOTAL SHOULDER ARTHROPLASTY Right 06/16/2020   Procedure: RIGHT TOTAL SHOULDER ARTHROPLASTY;  Surgeon: Meredith Pel, MD;  Location: [place];  Service: Orthopedics;  Laterality: Right;   TUBAL LIGATION      There were no vitals filed for this visit.   Subjective Assessment - 10/05/20 0802     Subjective "I'm not good. It's the weather."    Limitations Lifting    Patient Stated Goals return to 100%, regain use of RUE    Currently in Pain? Yes    Pain Score 8     Pain Location Shoulder    Pain Orientation Right    Pain Descriptors / Indicators Aching;Sore    Pain Type Acute pain;Surgical pain    Pain Onset More than a month ago    Pain Frequency Intermittent    Aggravating Factors  general soreness, worse with movement     Pain Relieving Factors walking                [facility] PT Assessment - 10/05/20 0814       Assessment   Medical Diagnosis Z96.611 (ICD-10-CM) - History of arthroplasty of right shoulder    Referring Provider (PT) Magnant, Gerrianne Scale, PA-C    Onset Date/Surgical Date 06/16/20      Observation/Other Assessments   Focus on Therapeutic Outcomes (FOTO)  38      AROM   Right Shoulder Flexion 81 Degrees   seated   Right Shoulder ABduction 78 Degrees   seated   Right Shoulder Internal Rotation --   seated FIR to posterior iliac crest     PROM   Right Shoulder Flexion 75 Degrees   supine   Right Shoulder ABduction 62 Degrees   supine   Right Shoulder Internal Rotation 61 Degrees   supine in 35 deg abdct   Right Shoulder External Rotation 24 Degrees   supine, 35 deg abdct     Strength   Strength Assessment Site Shoulder    Right/Left Shoulder Right  Right Shoulder Flexion --   3.6#, 4.5# = 4.05#                          [facility] Adult PT Treatment/Exercise - 10/05/20 0803       Self-Care   Other Self-Care Comments  long discussion with pt about current progress and decline in function, as well as FOTO score decrease.  PT recommending hold at this time and pt to return to MD next week for further assessment.  Pt in agreement      Shoulder Exercises: ROM/Strengthening   UBE (Upper Arm Bike) Lvl 1 6 min (3 min each way) - 1-2 RPMs noted with rest breaks due to pain    Nustep (0.16 miles in 5 min)                       PT Short Term Goals - 07/13/20 1547       PT SHORT TERM GOAL #1   Title Independent with HEP    Time 3    Period Weeks    Status Achieved    Target Date 07/23/20               PT Long Term Goals - 10/05/20 0822       PT LONG TERM GOAL #1   Title Independent with final HEP    Baseline 10/3: unable to progress    Time 4    Period Weeks    Status On-going    Target Date 10/26/20      PT LONG TERM GOAL #2   Title  Improve FOTO score to 64 for improved mobility    Time 4    Period Weeks    Status On-going    Target Date 10/26/20      PT LONG TERM GOAL #3   Title Improve Rt shoulder AROM to [facility] for improved function and mobility    Baseline 10/3: decline in AROM noted    Time 4    Period Weeks    Status On-going    Target Date 10/26/20      PT LONG TERM GOAL #4   Title Improve Rt shoulder strength to at least 4/5 for improved function    Time 4    Period Weeks    Status On-going      PT LONG TERM GOAL #5   Title report no pain with work simulated activities for improved function in anticipation of return to work    Baseline 10/3    Time 4    Period Weeks    Status On-going    Target Date 10/26/20                   Plan - 10/05/20 0943     Clinical Impression Statement Pt is demonstrating a steady decline in ROM, both actively and passively.  She does demonstrate better AROM in sitting than PROM in supine at this time due to pain with PROM.  She's still c/o difficulty from self reported gas in her bicep despite trial of different medications.  At this time recommend holding PT and pt return to MD for further assessment.    Personal Factors and Comorbidities Comorbidity 3+;Profession    Comorbidities anemia, arthritis, diverticulitis, GERD, hepatitis, and HTN    Examination-Activity Limitations Bathing;Caring for Others;Carry;Lift;Reach Overhead;Hygiene/Grooming    Examination-Participation Restrictions Church;Cleaning;Community Activity;Occupation;Meal Prep;Laundry;Driving    Stability/Clinical Decision Making Stable/Uncomplicated  Rehab Potential Good    PT Frequency 1x / week   1x/wk x 3 wks, then 2x/wk x 9 wks   PT Duration 4 weeks    PT Treatment/Interventions ADLs/Self Care Home Management;Cryotherapy;Electrical Stimulation;Moist Heat;Therapeutic exercise;Therapeutic activities;Functional mobility training;Neuromuscular re-education;Patient/family education;Manual  techniques;Vasopneumatic Device;Taping;Dry needling;Passive range of motion    PT Next Visit Plan refer back to MD for further assessment due to decline in function    PT Home Exercise Plan Access Code: [email or web address]    Consulted and Agree with Plan of Care Patient             Patient will benefit from skilled therapeutic intervention in order to improve the following deficits and impairments:  Pain, Impaired UE functional use, Decreased strength, Decreased range of motion  Visit Diagnosis: Stiffness of right shoulder, not elsewhere classified  Acute pain of right shoulder  Localized edema  Muscle weakness (generalized)     Problem List Patient Active Problem List   Diagnosis Date Noted   Shoulder arthritis    S/P shoulder replacement, 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 Patient goals were partially met. Patient is being discharged due to not returning since the last visit. Scot Jun, PT, DPT, OCS, ATC 12/15/20  9:48 AM     [facility] Physical Therapy [address] [place], [place], [address] Phone: [phone]   Fax:  [phone]  Name: Angela Villanueva MRN: [id number] Date of Birth: 01/21/1955

## 2020-10-12 ENCOUNTER — Ambulatory Visit (INDEPENDENT_AMBULATORY_CARE_PROVIDER_SITE_OTHER): Payer: No Typology Code available for payment source | Admitting: Orthopedic Surgery

## 2020-10-12 ENCOUNTER — Other Ambulatory Visit (HOSPITAL_COMMUNITY): Payer: Self-pay

## 2020-10-12 ENCOUNTER — Ambulatory Visit: Payer: Self-pay

## 2020-10-12 ENCOUNTER — Other Ambulatory Visit: Payer: Self-pay | Admitting: Orthopedic Surgery

## 2020-10-12 ENCOUNTER — Other Ambulatory Visit: Payer: Self-pay

## 2020-10-12 DIAGNOSIS — M25511 Pain in right shoulder: Secondary | ICD-10-CM | POA: Diagnosis not present

## 2020-10-12 DIAGNOSIS — M47812 Spondylosis without myelopathy or radiculopathy, cervical region: Secondary | ICD-10-CM | POA: Diagnosis not present

## 2020-10-12 MED ORDER — VALSARTAN-HYDROCHLOROTHIAZIDE 160-12.5 MG PO TABS
ORAL_TABLET | ORAL | 0 refills | Status: DC
  Filled 2020-10-12: qty 90, 90d supply, fill #0

## 2020-10-12 MED ORDER — HYDROCODONE-ACETAMINOPHEN 5-325 MG PO TABS
1.0000 | ORAL_TABLET | Freq: Two times a day (BID) | ORAL | 0 refills | Status: DC | PRN
  Filled 2020-10-12: qty 30, 15d supply, fill #0

## 2020-10-12 MED ORDER — AMLODIPINE BESYLATE 5 MG PO TABS
5.0000 mg | ORAL_TABLET | Freq: Every evening | ORAL | 0 refills | Status: DC
  Filled 2020-10-12: qty 90, 90d supply, fill #0

## 2020-10-13 ENCOUNTER — Telehealth: Payer: Self-pay | Admitting: Orthopedic Surgery

## 2020-10-13 LAB — CBC WITH DIFFERENTIAL/PLATELET
Basophils Absolute: 0.1 10*3/uL (ref 0.0–0.2)
Basos: 1 %
EOS (ABSOLUTE): 0.1 10*3/uL (ref 0.0–0.4)
Eos: 1 %
Hematocrit: 39.5 % (ref 34.0–46.6)
Hemoglobin: 13.1 g/dL (ref 11.1–15.9)
Immature Grans (Abs): 0 10*3/uL (ref 0.0–0.1)
Immature Granulocytes: 0 %
Lymphocytes Absolute: 4.9 10*3/uL — ABNORMAL HIGH (ref 0.7–3.1)
Lymphs: 64 %
MCH: 28.9 pg (ref 26.6–33.0)
MCHC: 33.2 g/dL (ref 31.5–35.7)
MCV: 87 fL (ref 79–97)
Monocytes Absolute: 0.5 10*3/uL (ref 0.1–0.9)
Monocytes: 6 %
Neutrophils Absolute: 2.1 10*3/uL (ref 1.4–7.0)
Neutrophils: 28 %
Platelets: 168 10*3/uL (ref 150–450)
RBC: 4.54 x10E6/uL (ref 3.77–5.28)
RDW: 11.9 % (ref 11.7–15.4)
WBC: 7.6 10*3/uL (ref 3.4–10.8)

## 2020-10-13 LAB — C-REACTIVE PROTEIN: CRP: <1 mg/L (ref 0–10)

## 2020-10-13 LAB — SEDIMENTATION RATE: Sed Rate: 6 mm/hr (ref 0–40)

## 2020-10-13 NOTE — Telephone Encounter (Signed)
Pt submitted extended short term/FMLA disability forms, medical release form, and $25.00 cash payment to [employer]. Accepted 10/13/20

## 2020-10-13 NOTE — Progress Notes (Signed)
Labs neg for infxn pls calal thx

## 2020-10-13 NOTE — Progress Notes (Signed)
Patient aware of the below message  

## 2020-10-16 ENCOUNTER — Ambulatory Visit: Payer: No Typology Code available for payment source | Admitting: Orthopedic Surgery

## 2020-10-16 ENCOUNTER — Encounter: Payer: Self-pay | Admitting: Orthopedic Surgery

## 2020-10-16 NOTE — Progress Notes (Signed)
Office Visit Note   Patient: Angela Villanueva           Date of Birth: September 23, 1955           MRN: [id number] Visit Date: 10/12/2020 Requested by: Angela Villanueva, Angela Villanueva,  [place] [address] PCP: Angela Hawks, FNP  Subjective: Chief Complaint  Patient presents with   Right Shoulder - Routine Post Op    right total shoulder arthroplasty on 06/16/2020    HPI: [name] is a 65 year old patient who is now 4 months out from right shoulder arthroplasty.  She states "I am not doing good".  Reports pain with decreased range of motion.  States she "needs pain pills".  Reports having chills.              ROS: All systems reviewed are negative as they relate to the chief complaint within the history of present illness.  Patient denies  fevers or chills.   Assessment & Plan: Visit Diagnoses:  1. Right shoulder pain, unspecified chronicity     Plan: Impression is fairly mobile and functional shoulder replacement.  No warmth to the incision.  At the time of this dictation CBC DIF sed rate C-reactive protein are normal.  Patient has significant arthritis in her cervical spine which may be causing right-sided radiculopathy.  Plan is refill Norco 1 every 12 hours with MRI cervical spine to evaluate right-sided radiculopathy.  Follow-up after that study.  No real intervention to do on the shoulder at this time.  Subscap strength is reasonable and the shoulder is located.  Follow-Up Instructions: Return for after MRI.   Orders:  Orders Placed This Encounter  Procedures   XR Shoulder Right   XR Cervical Spine 2 or 3 views   MR Cervical Spine w/o contrast   Meds ordered this encounter  Medications   HYDROcodone-acetaminophen (NORCO/VICODIN) 5-325 MG tablet    Sig: Take 1 tablet by mouth every 12 (twelve) hours as needed for moderate pain.    Dispense:  30 tablet    Refill:  0      Procedures: No procedures performed   Clinical Data: No additional  findings.  Objective: Vital Signs: There were no vitals taken for this visit.  Physical Exam:   Constitutional: Patient appears well-developed HEENT:  Head: Normocephalic Eyes:EOM are normal Neck: Normal range of motion Cardiovascular: Normal rate Pulmonary/chest: Effort normal Neurologic: Patient is alert Skin: Skin is warm Psychiatric: Patient has normal mood and affect   Ortho Exam: Ortho exam demonstrates good subscap strength as well as external rotation strength on the right-hand side.  No warmth to the incision no drainage to the incision.  Cervical spine range of motion slightly limited but grip EPL FPL interosseous wrist flexion wrist extension bicep triceps and deltoid strength is intact.  No masses lymphadenopathy or skin changes noted in the right shoulder girdle region.  Rotation is slightly limited in the cervical spine 30 degrees bilaterally.  Specialty Comments:  No specialty comments available.  Imaging: No results found.   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   Gastroparesis 09/01/2015   Abdominal pain, epigastric 01/22/2014   Esophageal reflux 05/02/2012   Dysphagia 05/02/2012   DUODENITIS, WITHOUT HEMORRHAGE 04/09/2007   Constipation 04/09/2007   History of peptic ulcer disease 04/09/2007   Past Medical History:  Diagnosis Date   Allergy    Anemia  Arthritis    Diverticulitis    Gastric ulcer    GERD (gastroesophageal reflux disease)    Hepatitis    Hypertension     Family History  Problem Relation Age of Onset   COPD Mother    Cancer Mother    Other Mother        blood cloth   Diabetes Other     Past Surgical History:  Procedure Laterality Date   ABDOMINAL HYSTERECTOMY     EYE SURGERY     fibroid tumor removal     FOOT SURGERY     LAPAROSCOPIC GASTROTOMY W/ REPAIR OF ULCER     PARTIAL HYSTERECTOMY     TONSILLECTOMY     AS CHILD    TOTAL SHOULDER ARTHROPLASTY Right 06/16/2020   Procedure: RIGHT TOTAL SHOULDER ARTHROPLASTY;  Surgeon: Angela Pel, MD;  Location: [place];  Service: Orthopedics;  Laterality: Right;   TUBAL LIGATION     Social History   Occupational History   Occupation: Custodian    Employer: [employer]  Tobacco Use   Smoking status: Former    Types: Cigarettes    Quit date: 01/04/1992    Years since quitting: 28.8   Smokeless tobacco: Never  Vaping Use   Vaping Use: Never used  Substance and Sexual Activity   Alcohol use: No   Drug use: No   Sexual activity: Not Currently

## 2020-10-25 ENCOUNTER — Ambulatory Visit
Admission: RE | Admit: 2020-10-25 | Discharge: 2020-10-25 | Disposition: A | Discharge status: Home / Self Care | Payer: No Typology Code available for payment source | Source: Ambulatory Visit | Attending: Orthopedic Surgery | Admitting: Orthopedic Surgery

## 2020-10-25 DIAGNOSIS — M25511 Pain in right shoulder: Secondary | ICD-10-CM

## 2020-10-27 ENCOUNTER — Other Ambulatory Visit: Payer: Self-pay

## 2020-10-27 DIAGNOSIS — M4802 Spinal stenosis, cervical region: Secondary | ICD-10-CM

## 2020-10-27 NOTE — Progress Notes (Signed)
I think she has some right-sided foraminal stenosis at C6-7 which could be the culprit.  Can you get her set up to see Dr. Ernestina Patches for cervical spine ESI.  Thanks

## 2020-11-02 ENCOUNTER — Other Ambulatory Visit (HOSPITAL_COMMUNITY): Payer: Self-pay

## 2020-11-02 ENCOUNTER — Other Ambulatory Visit: Payer: Self-pay

## 2020-11-02 ENCOUNTER — Encounter: Payer: Self-pay | Admitting: Orthopedic Surgery

## 2020-11-02 ENCOUNTER — Ambulatory Visit (INDEPENDENT_AMBULATORY_CARE_PROVIDER_SITE_OTHER): Payer: No Typology Code available for payment source | Admitting: Orthopedic Surgery

## 2020-11-02 DIAGNOSIS — M4802 Spinal stenosis, cervical region: Secondary | ICD-10-CM

## 2020-11-02 MED ORDER — HYDROCODONE-ACETAMINOPHEN 5-325 MG PO TABS
1.0000 | ORAL_TABLET | Freq: Two times a day (BID) | ORAL | 0 refills | Status: DC | PRN
  Filled 2020-11-02: qty 30, 15d supply, fill #0

## 2020-11-03 ENCOUNTER — Other Ambulatory Visit (HOSPITAL_COMMUNITY): Payer: Self-pay

## 2020-11-03 ENCOUNTER — Other Ambulatory Visit: Payer: Self-pay | Admitting: Orthopedic Surgery

## 2020-11-03 ENCOUNTER — Telehealth: Payer: Self-pay | Admitting: Orthopedic Surgery

## 2020-11-03 ENCOUNTER — Telehealth: Payer: Self-pay | Admitting: Physical Medicine and Rehabilitation

## 2020-11-03 NOTE — Telephone Encounter (Signed)
Angela Villanueva called stating a request was put in for a injection and she would like a CB to discuss the different levels.   Jessica# [phone]

## 2020-11-03 NOTE — Telephone Encounter (Signed)
Ok to rf thanks

## 2020-11-03 NOTE — Telephone Encounter (Signed)
Patient called advised Rx is not showing received at the pharmacy ([facility]) [place].  The number to contact patient is [phone]

## 2020-11-04 ENCOUNTER — Other Ambulatory Visit (HOSPITAL_COMMUNITY): Payer: Self-pay

## 2020-11-04 ENCOUNTER — Telehealth: Payer: Self-pay | Admitting: Physical Medicine and Rehabilitation

## 2020-11-04 MED ORDER — CELECOXIB 100 MG PO CAPS
100.0000 mg | ORAL_CAPSULE | Freq: Two times a day (BID) | ORAL | 0 refills | Status: DC
  Filled 2020-11-04: qty 60, 30d supply, fill #0

## 2020-11-04 NOTE — Telephone Encounter (Signed)
Pt calling to get a refill on her celecoxib prescription. Pt does not know if it was Marlou Sa or Ernestina Patches that prescribed it but thought it was [facility]. The best pharmacy is [facility] and the best call back number if needed is [phone].

## 2020-11-04 NOTE — Telephone Encounter (Signed)
Yes.  1 p.o. daily to twice a day #60 with no refills thanks

## 2020-11-04 NOTE — Telephone Encounter (Signed)
Sent to pharmacy 

## 2020-11-04 NOTE — Telephone Encounter (Signed)
Done see other note 

## 2020-11-06 ENCOUNTER — Telehealth: Payer: Self-pay | Admitting: Orthopedic Surgery

## 2020-11-06 NOTE — Telephone Encounter (Signed)
Pt submitted medical release forms, and $25.00 cash payment to [employer]. [place] forms faxed. Accepted 11/06/20

## 2020-11-06 NOTE — Telephone Encounter (Signed)
[place] forms received. To [employer]. ?

## 2020-11-10 ENCOUNTER — Encounter: Payer: Self-pay | Admitting: Orthopedic Surgery

## 2020-11-10 ENCOUNTER — Other Ambulatory Visit (HOSPITAL_COMMUNITY): Payer: Self-pay

## 2020-11-10 NOTE — Progress Notes (Addendum)
Office Visit Note   Patient: Angela Villanueva           Date of Birth: 01-14-1955           MRN: [id number] Visit Date: 11/02/2020 Requested by: Jordan Hawks, [facility] [place],  [place] [address] PCP: Jordan Hawks, FNP  Subjective: Chief Complaint  Patient presents with   Right Shoulder - Follow-up    HPI: Angela Villanueva is a patient who is now about 4 and half months out from right shoulder replacement.  The shoulder replacements actually functioning well but she is having a lot of radicular symptoms involving the arm.  Since she was last seen she has had an MRI scan of her cervical spine.  Cervical spine MRI shows multilevel cervical spondylosis most pronounced at the C4-5 level where there is moderate canal stenosis with severe left and mild right foraminal stenosis.  There is also mild to moderate canal stenosis at C5-6.  She has not really returned yet to work.              ROS: All systems reviewed are negative as they relate to the chief complaint within the history of present illness.  Patient denies  fevers or chills.   Assessment & Plan: Visit Diagnoses:  1. Spinal stenosis of cervical region     Plan: Impression is pretty well-functioning right shoulder replacement with cervical spine pathology which I think could explain the least part of her arm symptoms.  Hydrocodone refilled.  Refer to Dr. Ernestina Patches for cervical spine ESI.  Come back to [place] in about 4 weeks.  I think it is going to be difficult for her to go back to doing physical type work.  We can make an assessment about that in about 2 months.  She is in between long-term and short-term disability.  Norco refill x1 only  Follow-Up Instructions: No follow-ups on file.   Orders:  Orders Placed This Encounter  Procedures   Ambulatory referral to Physical Medicine Rehab   Meds ordered this encounter  Medications   HYDROcodone-acetaminophen (NORCO/VICODIN) 5-325 MG tablet    Sig: Take 1 tablet by  mouth every 12 (twelve) hours as needed for moderate pain.    Dispense:  30 tablet    Refill:  0      Procedures: No procedures performed   Clinical Data: No additional findings.  Objective: Vital Signs: There were no vitals taken for this visit.  Physical Exam:   Constitutional: Patient appears well-developed HEENT:  Head: Normocephalic Eyes:EOM are normal Neck: Normal range of motion Cardiovascular: Normal rate Pulmonary/chest: Effort normal Neurologic: Patient is alert Skin: Skin is warm Psychiatric: Patient has normal mood and affect   Ortho Exam: Ortho exam demonstrates very good motion of the right shoulder.  She has good infraspinatus supraspinatus and subscap strength.  Shoulder range of motion passively is good.  Mild paresthesias on the right in the C5 distribution as well as the left.  Deltoid strength is excellent.  No masses lymphadenopathy or skin changes noted in that shoulder girdle region.  No warmth to the incision.  Specialty Comments:  No specialty comments available.  Imaging: No results found.   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   Gastroparesis 09/01/2015   Abdominal pain, epigastric 01/22/2014   Esophageal reflux 05/02/2012   Dysphagia 05/02/2012   DUODENITIS, WITHOUT HEMORRHAGE 04/09/2007  Constipation 04/09/2007   History of peptic ulcer disease 04/09/2007   Past Medical History:  Diagnosis Date   Allergy    Anemia    Arthritis    Diverticulitis    Gastric ulcer    GERD (gastroesophageal reflux disease)    Hepatitis    Hypertension     Family History  Problem Relation Age of Onset   COPD Mother    Cancer Mother    Other Mother        blood cloth   Diabetes Other     Past Surgical History:  Procedure Laterality Date   ABDOMINAL HYSTERECTOMY     EYE SURGERY     fibroid tumor removal     FOOT SURGERY     LAPAROSCOPIC  GASTROTOMY W/ REPAIR OF ULCER     PARTIAL HYSTERECTOMY     TONSILLECTOMY     AS CHILD   TOTAL SHOULDER ARTHROPLASTY Right 06/16/2020   Procedure: RIGHT TOTAL SHOULDER ARTHROPLASTY;  Surgeon: Meredith Pel, MD;  Location: [facility];  Service: Orthopedics;  Laterality: Right;   TUBAL LIGATION     Social History   Occupational History   Occupation: Custodian    Employer: [employer]  Tobacco Use   Smoking status: Former    Types: Cigarettes    Quit date: 01/04/1992    Years since quitting: 28.8   Smokeless tobacco: Never  Vaping Use   Vaping Use: Never used  Substance and Sexual Activity   Alcohol use: No   Drug use: No   Sexual activity: Not Currently

## 2020-11-11 ENCOUNTER — Telehealth: Payer: Self-pay

## 2020-11-11 NOTE — Telephone Encounter (Signed)
Pt called and would like to know if her forms have been faxed regarding her long term disability Please advise

## 2020-11-12 ENCOUNTER — Other Ambulatory Visit (HOSPITAL_COMMUNITY): Payer: Self-pay | Admitting: Ophthalmology

## 2020-11-12 ENCOUNTER — Other Ambulatory Visit (HOSPITAL_COMMUNITY): Payer: Self-pay

## 2020-11-12 NOTE — Telephone Encounter (Signed)
Forms have been faxed. [employer] informed patient.

## 2020-11-13 ENCOUNTER — Other Ambulatory Visit (HOSPITAL_COMMUNITY): Payer: Self-pay

## 2020-11-14 ENCOUNTER — Other Ambulatory Visit (HOSPITAL_COMMUNITY): Payer: Self-pay

## 2020-11-14 ENCOUNTER — Other Ambulatory Visit (HOSPITAL_COMMUNITY): Payer: Self-pay | Admitting: Ophthalmology

## 2020-11-16 ENCOUNTER — Other Ambulatory Visit (HOSPITAL_COMMUNITY): Payer: Self-pay

## 2020-11-16 MED ORDER — CYCLOSPORINE 0.05 % OP EMUL
1.0000 [drp] | Freq: Two times a day (BID) | OPHTHALMIC | 3 refills | Status: AC
Start: 2020-11-16 — End: ?
  Filled 2020-11-16: qty 180, 90d supply, fill #0
  Filled 2021-09-08: qty 180, 90d supply, fill #1

## 2020-11-16 MED ORDER — PEG 3350-KCL-NA BICARB-NACL 420 G PO SOLR
ORAL | 0 refills | Status: DC
  Filled 2020-11-16: qty 4000, 1d supply, fill #0

## 2020-11-18 ENCOUNTER — Telehealth: Payer: Self-pay | Admitting: Orthopedic Surgery

## 2020-11-18 NOTE — Telephone Encounter (Signed)
Pt submitted medical release forms, Will find out if FMLA forms were faxed, Pt also paid $25.00 cash payment to [employer]. Accepted 11/18/20

## 2020-12-02 ENCOUNTER — Other Ambulatory Visit (HOSPITAL_COMMUNITY): Payer: Self-pay

## 2020-12-02 ENCOUNTER — Telehealth: Payer: Self-pay | Admitting: Orthopedic Surgery

## 2020-12-02 MED ORDER — PANTOPRAZOLE SODIUM 40 MG PO TBEC
DELAYED_RELEASE_TABLET | ORAL | 1 refills | Status: DC
  Filled 2020-12-02: qty 90, 90d supply, fill #0
  Filled 2021-02-22: qty 90, 90d supply, fill #1

## 2020-12-02 NOTE — Telephone Encounter (Signed)
Pt called requesting a refill of hydrocodone. Please send to pharmacy on file [facility] pharmacy. Please call pt when meds is sent in. Pt phone number is [phone].

## 2020-12-03 ENCOUNTER — Other Ambulatory Visit: Payer: Self-pay | Admitting: Surgical

## 2020-12-03 ENCOUNTER — Other Ambulatory Visit (HOSPITAL_COMMUNITY): Payer: Self-pay

## 2020-12-03 MED ORDER — HYDROCODONE-ACETAMINOPHEN 5-325 MG PO TABS
1.0000 | ORAL_TABLET | Freq: Two times a day (BID) | ORAL | 0 refills | Status: DC | PRN
  Filled 2020-12-03: qty 20, 10d supply, fill #0

## 2020-12-03 NOTE — Telephone Encounter (Signed)
I will refill for short period until she sees dr newton in 1.5 weeks.  No more refills afterward

## 2020-12-04 ENCOUNTER — Other Ambulatory Visit (HOSPITAL_COMMUNITY): Payer: Self-pay

## 2020-12-04 ENCOUNTER — Other Ambulatory Visit: Payer: Self-pay | Admitting: Orthopedic Surgery

## 2020-12-04 ENCOUNTER — Telehealth: Payer: Self-pay | Admitting: Orthopedic Surgery

## 2020-12-04 MED ORDER — CELECOXIB 100 MG PO CAPS
100.0000 mg | ORAL_CAPSULE | Freq: Two times a day (BID) | ORAL | 0 refills | Status: DC
  Filled 2020-12-04: qty 60, 30d supply, fill #0

## 2020-12-04 NOTE — Telephone Encounter (Signed)
Yeah ok for this

## 2020-12-04 NOTE — Telephone Encounter (Signed)
Per another message this was sent in. Pt was called and advised

## 2020-12-04 NOTE — Telephone Encounter (Signed)
IC s/w patient and advised. She verbalized understanding.  

## 2020-12-04 NOTE — Telephone Encounter (Signed)
Ill do it actually

## 2020-12-04 NOTE — Telephone Encounter (Signed)
Pt called stating she needs a refill of her celebrex 100 mg rx; pt would like a Cb when this has been sent please.   [phone]

## 2020-12-04 NOTE — Telephone Encounter (Signed)
Is this ok?

## 2020-12-05 ENCOUNTER — Other Ambulatory Visit (HOSPITAL_COMMUNITY): Payer: Self-pay

## 2020-12-14 ENCOUNTER — Other Ambulatory Visit (HOSPITAL_COMMUNITY): Payer: Self-pay

## 2020-12-14 ENCOUNTER — Ambulatory Visit: Payer: Self-pay

## 2020-12-14 ENCOUNTER — Encounter: Payer: Self-pay | Admitting: Physical Medicine and Rehabilitation

## 2020-12-14 ENCOUNTER — Other Ambulatory Visit: Payer: Self-pay

## 2020-12-14 ENCOUNTER — Ambulatory Visit (INDEPENDENT_AMBULATORY_CARE_PROVIDER_SITE_OTHER): Payer: No Typology Code available for payment source | Admitting: Physical Medicine and Rehabilitation

## 2020-12-14 DIAGNOSIS — M5412 Radiculopathy, cervical region: Secondary | ICD-10-CM

## 2020-12-14 DIAGNOSIS — G8929 Other chronic pain: Secondary | ICD-10-CM

## 2020-12-14 DIAGNOSIS — M25511 Pain in right shoulder: Secondary | ICD-10-CM

## 2020-12-14 MED ORDER — METHYLPREDNISOLONE ACETATE 80 MG/ML IJ SUSP
80.0000 mg | Freq: Once | INTRAMUSCULAR | Status: AC
  Administered 2020-12-14: 80 mg

## 2020-12-14 MED ORDER — PEG 3350-KCL-NA BICARB-NACL 420 G PO SOLR
ORAL | 0 refills | Status: DC
  Filled 2020-12-14: qty 4000, 1d supply, fill #0

## 2020-12-14 NOTE — Patient Instructions (Signed)

## 2020-12-14 NOTE — Progress Notes (Signed)
Pt state right shoulder pain. Pt any movement with her right arm makes the pain worse. Pt state she takes pain meds to help ease her pain.  Numeric Pain Rating Scale and Functional Assessment Average Pain 10   In the last MONTH (on 0-10 scale) has pain interfered with the following?  1. General activity like being  able to carry out your everyday physical activities such as walking, climbing stairs, carrying groceries, or moving a chair?  Rating(10)  -BT, -Dye Allergies.

## 2020-12-14 NOTE — Progress Notes (Signed)
Angela Villanueva - 65 y.o. female MRN [id number]  Date of birth: [birth year]  Office Visit Note: Visit Date: 12/14/2020 PCP: Jordan Hawks, FNP Referred by: Jordan Hawks, FNP  Subjective: Chief Complaint  Patient presents with   Right Shoulder - Pain   HPI:  Angela Villanueva is a 65 y.o. female who comes in today  at the request of Dr. Anderson Malta for planned Right C7-T1 Cervical Interlaminar epidural steroid injection with fluoroscopic guidance.  The patient has failed conservative care including home exercise, medications, time and activity modification.  This injection will be diagnostic and hopefully therapeutic.  Please see requesting physician notes for further details and justification. MRI reviewed with images and spine model.  MRI reviewed in the note below.  By way of quick review we also received a handwritten referral from her primary care physician Dr. Criss Rosales for right shoulder injection for osteoarthritis of the glenohumeral joint.  The patient has already had a replacement of the right shoulder by Dr. Anderson Malta.  This was completed in June of this year.  The patient reports to me that she was also told she did not really have any bones in the right side of the neck but I did show her the MRI today and x-ray pictures showing the skeletal structure of the cervical spine explained the injection today in detail of her cervical spine and she did agree to complete the injection.  She will follow-up with Dr. Marlou Sa depending on the results for her shoulder pain.   ROS Otherwise per HPI.  Assessment & Plan: Visit Diagnoses:    ICD-10-CM   1. Cervical radiculopathy  M54.12 Epidural Steroid injection    methylPREDNISolone acetate (DEPO-MEDROL) injection 80 mg    2. Chronic right shoulder pain  M25.511 XR C-ARM NO REPORT   G89.29 Epidural Steroid injection    methylPREDNISolone acetate (DEPO-MEDROL) injection 80 mg      Plan: No additional findings.   Meds & Orders:   Meds ordered this encounter  Medications   methylPREDNISolone acetate (DEPO-MEDROL) injection 80 mg    Orders Placed This Encounter  Procedures   XR C-ARM NO REPORT   Epidural Steroid injection    Follow-up: Return for visit to requesting provider as needed.   Procedures: No procedures performed  Cervical Epidural Steroid Injection - Interlaminar Approach with Fluoroscopic Guidance  Patient: Angela Villanueva      Date of Birth: 1955-08-05 MRN: [id number] PCP: Jordan Hawks, FNP      Visit Date: 12/14/2020   Universal Protocol:    Date/Time: 12/13/221:09 PM  Consent Given By: the patient  Position: PRONE  Additional Comments: Vital signs were monitored before and after the procedure. Patient was prepped and draped in the usual sterile fashion. The correct patient, procedure, and site was verified.   Injection Procedure Details:   Procedure diagnoses: Cervical radiculopathy [M54.12]    Meds Administered:  Meds ordered this encounter  Medications   methylPREDNISolone acetate (DEPO-MEDROL) injection 80 mg     Laterality: Right  Location/Site: C7-T1  Needle: 3.5 in., 20 ga. Tuohy  Needle Placement: Paramedian epidural space  Findings:  -Comments: Excellent flow of contrast into the epidural space.  Procedure Details: Using a paramedian approach from the side mentioned above, the region overlying the inferior lamina was localized under fluoroscopic visualization and the soft tissues overlying this structure were infiltrated with 4 ml. of 1% Lidocaine without Epinephrine. A # 20 gauge, Tuohy needle was inserted into the epidural  space using a paramedian approach.  The epidural space was localized using loss of resistance along with contralateral oblique bi-planar fluoroscopic views.  After negative aspirate for air, blood, and CSF, a 2 ml. volume of Isovue-250 was injected into the epidural space and the flow of contrast was observed. Radiographs were obtained  for documentation purposes.   The injectate was administered into the level noted above.  Additional Comments:  The patient tolerated the procedure well Dressing: 2 x 2 sterile gauze and Band-Aid    Post-procedure details: Patient was observed during the procedure. Post-procedure instructions were reviewed.  Patient left the clinic in stable condition.   Clinical History: MRI CERVICAL SPINE WITHOUT CONTRAST   TECHNIQUE: Multiplanar, multisequence MR imaging of the cervical spine was performed. No intravenous contrast was administered.   COMPARISON:  X-ray 10/12/2020   FINDINGS: Alignment: Straightening of the cervical lordosis with trace anterolisthesis of C3 on C4 and slight retrolisthesis at L4 on L5.   Vertebrae: No fracture, evidence of discitis, or bone lesion.   Cord: Normal signal and morphology.   Posterior Fossa, vertebral arteries, paraspinal tissues: Negative.   Disc levels:   C2-C3: No disc protrusion. Facet joints within normal limits. No foraminal or canal stenosis.   C3-C4: Minimal disc osteophyte complex with mild right facet arthropathy. No foraminal or canal stenosis.   C4-C5: Diffuse disc osteophyte complex with bilateral uncovertebral spurring result in flattening of the cervical cord and moderate canal stenosis. Severe left and mild right foraminal stenosis.   C5-C6: Diffuse disc osteophyte complex with left worse than right uncovertebral spurring results in mild-moderate canal stenosis with moderate left foraminal stenosis.   C6-C7: Disc osteophyte complex with right greater than left uncovertebral spurring and left-sided facet arthropathy. No significant canal stenosis. Mild-moderate right foraminal stenosis.   C7-T1: Unremarkable disc. Mild left facet arthropathy. No foraminal or canal stenosis.   IMPRESSION: 1. Multilevel cervical spondylosis, most pronounced at the C4-5 level where there is moderate canal stenosis with severe left  and mild right foraminal stenosis. 2. Mild-moderate canal stenosis at C5-6 with moderate left foraminal stenosis. 3. Mild-moderate right foraminal stenosis at C6-7.     Electronically Signed   By: Davina Poke D.O.   On: 10/26/2020 14:31     Objective:  VS:  HT:    WT:   BMI:     BP:   HR: bpm  TEMP: ( )  RESP:  Physical Exam Vitals and nursing note reviewed.  Constitutional:      General: She is not in acute distress.    Appearance: Normal appearance. She is not ill-appearing.  HENT:     Head: Normocephalic and atraumatic.     Right Ear: External ear normal.     Left Ear: External ear normal.  Eyes:     Extraocular Movements: Extraocular movements intact.  Cardiovascular:     Rate and Rhythm: Normal rate.     Pulses: Normal pulses.  Musculoskeletal:     Cervical back: Tenderness present. No rigidity.     Right lower leg: No edema.     Left lower leg: No edema.     Comments: Patient has good strength in the upper extremities including 5 out of 5 strength in wrist extension long finger flexion and APB.  There is no atrophy of the hands intrinsically.  There is a negative Hoffmann's test.   Lymphadenopathy:     Cervical: No cervical adenopathy.  Skin:    Findings: No erythema, lesion or rash.  Neurological:     General: No focal deficit present.     Mental Status: She is alert and oriented to person, place, and time.     Sensory: No sensory deficit.     Motor: No weakness or abnormal muscle tone.     Coordination: Coordination normal.  Psychiatric:        Mood and Affect: Mood normal.        Behavior: Behavior normal.     Imaging: XR C-ARM NO REPORT  Result Date: 12/14/2020 Please see Notes tab for imaging impression.

## 2020-12-15 ENCOUNTER — Other Ambulatory Visit (HOSPITAL_COMMUNITY): Payer: Self-pay

## 2020-12-15 NOTE — Procedures (Signed)
Cervical Epidural Steroid Injection - Interlaminar Approach with Fluoroscopic Guidance  Patient: Angela Villanueva      Date of Birth: 1955-10-20 MRN: [id number] PCP: Jordan Hawks, FNP      Visit Date: 12/14/2020   Universal Protocol:    Date/Time: 12/13/221:09 PM  Consent Given By: the patient  Position: PRONE  Additional Comments: Vital signs were monitored before and after the procedure. Patient was prepped and draped in the usual sterile fashion. The correct patient, procedure, and site was verified.   Injection Procedure Details:   Procedure diagnoses: Cervical radiculopathy [M54.12]    Meds Administered:  Meds ordered this encounter  Medications   methylPREDNISolone acetate (DEPO-MEDROL) injection 80 mg     Laterality: Right  Location/Site: C7-T1  Needle: 3.5 in., 20 ga. Tuohy  Needle Placement: Paramedian epidural space  Findings:  -Comments: Excellent flow of contrast into the epidural space.  Procedure Details: Using a paramedian approach from the side mentioned above, the region overlying the inferior lamina was localized under fluoroscopic visualization and the soft tissues overlying this structure were infiltrated with 4 ml. of 1% Lidocaine without Epinephrine. A # 20 gauge, Tuohy needle was inserted into the epidural space using a paramedian approach.  The epidural space was localized using loss of resistance along with contralateral oblique bi-planar fluoroscopic views.  After negative aspirate for air, blood, and CSF, a 2 ml. volume of Isovue-250 was injected into the epidural space and the flow of contrast was observed. Radiographs were obtained for documentation purposes.   The injectate was administered into the level noted above.  Additional Comments:  The patient tolerated the procedure well Dressing: 2 x 2 sterile gauze and Band-Aid    Post-procedure details: Patient was observed during the procedure. Post-procedure instructions were  reviewed.  Patient left the clinic in stable condition.

## 2021-01-11 ENCOUNTER — Ambulatory Visit: Payer: No Typology Code available for payment source | Admitting: Orthopedic Surgery

## 2021-01-12 ENCOUNTER — Other Ambulatory Visit (HOSPITAL_COMMUNITY): Payer: Self-pay

## 2021-01-12 MED ORDER — PANTOPRAZOLE SODIUM 40 MG PO TBEC
DELAYED_RELEASE_TABLET | ORAL | 1 refills | Status: AC
Start: 2021-01-12 — End: ?
  Filled 2021-01-12: qty 180, 90d supply, fill #0
  Filled 2021-10-28: qty 90, 90d supply, fill #0

## 2021-01-14 ENCOUNTER — Other Ambulatory Visit (HOSPITAL_COMMUNITY): Payer: Self-pay

## 2021-01-14 MED ORDER — AMLODIPINE BESYLATE 5 MG PO TABS
5.0000 mg | ORAL_TABLET | Freq: Every evening | ORAL | 0 refills | Status: DC
  Filled 2021-01-14: qty 90, 90d supply, fill #0

## 2021-01-15 ENCOUNTER — Other Ambulatory Visit (HOSPITAL_COMMUNITY): Payer: Self-pay

## 2021-01-15 MED ORDER — AMLODIPINE BESYLATE 5 MG PO TABS
ORAL_TABLET | ORAL | 0 refills | Status: DC
  Filled 2021-01-15 – 2021-06-01 (×2): qty 90, 90d supply, fill #0

## 2021-01-15 MED ORDER — XIFAXAN 550 MG PO TABS
550.0000 mg | ORAL_TABLET | Freq: Three times a day (TID) | ORAL | 0 refills | Status: DC
  Filled 2021-01-15: qty 42, 14d supply, fill #0

## 2021-01-19 ENCOUNTER — Other Ambulatory Visit (HOSPITAL_COMMUNITY): Payer: Self-pay

## 2021-01-20 ENCOUNTER — Other Ambulatory Visit (HOSPITAL_COMMUNITY): Payer: Self-pay

## 2021-01-21 ENCOUNTER — Other Ambulatory Visit (HOSPITAL_COMMUNITY): Payer: Self-pay

## 2021-01-21 ENCOUNTER — Other Ambulatory Visit: Payer: Self-pay | Admitting: Surgical

## 2021-01-21 MED ORDER — CELECOXIB 100 MG PO CAPS
100.0000 mg | ORAL_CAPSULE | Freq: Two times a day (BID) | ORAL | 0 refills | Status: DC
  Filled 2021-01-21: qty 60, 30d supply, fill #0

## 2021-01-22 ENCOUNTER — Other Ambulatory Visit: Payer: Self-pay

## 2021-01-22 ENCOUNTER — Telehealth: Payer: Self-pay | Admitting: Surgical

## 2021-01-22 ENCOUNTER — Ambulatory Visit (INDEPENDENT_AMBULATORY_CARE_PROVIDER_SITE_OTHER): Payer: No Typology Code available for payment source | Admitting: Orthopedic Surgery

## 2021-01-22 ENCOUNTER — Other Ambulatory Visit: Payer: Self-pay | Admitting: Surgical

## 2021-01-22 ENCOUNTER — Other Ambulatory Visit (HOSPITAL_COMMUNITY): Payer: Self-pay

## 2021-01-22 DIAGNOSIS — M4802 Spinal stenosis, cervical region: Secondary | ICD-10-CM | POA: Diagnosis not present

## 2021-01-22 MED ORDER — CELECOXIB 100 MG PO CAPS
100.0000 mg | ORAL_CAPSULE | Freq: Two times a day (BID) | ORAL | 0 refills | Status: DC
  Filled 2021-01-22: qty 60, 30d supply, fill #0

## 2021-01-22 NOTE — Telephone Encounter (Signed)
Patient called. She would like celecoxib (CELEBREX) 100 MG capsule [[id number] called in for her.

## 2021-01-22 NOTE — Telephone Encounter (Signed)
sent 

## 2021-01-22 NOTE — Telephone Encounter (Signed)
I left voicemail advising. ?

## 2021-01-24 ENCOUNTER — Encounter: Payer: Self-pay | Admitting: Orthopedic Surgery

## 2021-01-24 NOTE — Progress Notes (Addendum)
Office Visit Note   Patient: Angela Villanueva           Date of Birth: 1955-10-13           MRN: [id number] Visit Date: 01/22/2021 Requested by: Georgiann Cocker, FNP [address] [place],  [place] [address] PCP: Georgiann Cocker, FNP  Subjective: Chief Complaint  Patient presents with   Right Shoulder - Pain   Neck - Pain    HPI: Angela Villanueva is a patient here for 108-month follow-up.  Having continued neck and shoulder pain.  She 7 months out from shoulder replacement.  Had prior epidural steroid injection with Dr. Alvester Morin in December.  She had good relief but only for 1 day.  Pain wakes her from sleep at night.  She reports numbness and tingling in both arms and neck.  She does not want any more surgery.  Unable to lay on the right-hand side.  I do not really see her being able to go back to any type of physical work at this time.  Particularly since she does not want to pursue any type of cervical spine decompressive surgery.  She had normal labs in October.  Patient used to work at [employer] but she has not worked since her surgery.  This was fairly physical work.              ROS: All systems reviewed are negative as they relate to the chief complaint within the history of present illness.  Patient reports subjective fevers and chills.   Assessment & Plan: Visit Diagnoses:  1. Spinal stenosis of cervical region     Plan: Impression is shoulder and arm pain which looks to be more radicular in nature.  She had 1 ESI which only gave her 1 day of relief.  She does not want to do any type of cervical spine surgery.  Do not envision her being able to return to physical type work within the next 3 months or possibly ever based on her current functional status.  Plan is out of work for 3 more months.  Return at that time for final check and likely release.  Follow-Up Instructions: No follow-ups on file.   Orders:  No orders of the defined types were placed in this  encounter.  No orders of the defined types were placed in this encounter.     Procedures: No procedures performed   Clinical Data: No additional findings.  Objective: Vital Signs: There were no vitals taken for this visit.  Physical Exam:   Constitutional: Patient appears well-developed HEENT:  Head: Normocephalic Eyes:EOM are normal Neck: Normal range of motion Cardiovascular: Normal rate Pulmonary/chest: Effort normal Neurologic: Patient is alert Skin: Skin is warm Psychiatric: Patient has normal mood and affect   Ortho Exam: Ortho exam demonstrates well-healed surgical incision on the right.  No lymphadenopathy or warmth to the incision.  Mild pain with passive range of motion but rotator cuff strength is intact including supraspinatus and subscap and infraspinatus testing.  No other coarseness or grinding with internal and external rotation of that right arm.  No definite paresthesias on the right left-hand side.  Deltoid is functional bilaterally.  Neck range of motion mildly painful with extension and rotation.  Reflexes symmetric 0 1+ out of 4 bilateral biceps and triceps.  Radial pulse intact.  No atrophy in either arm in the musculature.  Specialty Comments:  No specialty comments available.  Imaging: No results found.   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   Gastroparesis 09/01/2015   Abdominal pain, epigastric 01/22/2014   Esophageal reflux 05/02/2012   Dysphagia 05/02/2012   DUODENITIS, WITHOUT HEMORRHAGE 04/09/2007   Constipation 04/09/2007   History of peptic ulcer disease 04/09/2007   Past Medical History:  Diagnosis Date   Allergy    Anemia    Arthritis    Diverticulitis    Gastric ulcer    GERD (gastroesophageal reflux disease)    Hepatitis    Hypertension     Family History  Problem Relation Age of Onset   COPD Mother    Cancer Mother     Other Mother        blood cloth   Diabetes Other     Past Surgical History:  Procedure Laterality Date   ABDOMINAL HYSTERECTOMY     EYE SURGERY     fibroid tumor removal     FOOT SURGERY     LAPAROSCOPIC GASTROTOMY W/ REPAIR OF ULCER     PARTIAL HYSTERECTOMY     TONSILLECTOMY     AS CHILD   TOTAL SHOULDER ARTHROPLASTY Right 06/16/2020   Procedure: RIGHT TOTAL SHOULDER ARTHROPLASTY;  Surgeon: Cammy Copa, MD;  Location: [facility];  Service: Orthopedics;  Laterality: Right;   TUBAL LIGATION     Social History   Occupational History   Occupation: Custodian    Employer: [employer]  Tobacco Use   Smoking status: Former    Types: Cigarettes    Quit date: 01/04/1992    Years since quitting: 29.0   Smokeless tobacco: Never  Vaping Use   Vaping Use: Never used  Substance and Sexual Activity   Alcohol use: No   Drug use: No   Sexual activity: Not Currently

## 2021-01-25 ENCOUNTER — Other Ambulatory Visit (HOSPITAL_COMMUNITY): Payer: Self-pay

## 2021-01-25 ENCOUNTER — Telehealth: Payer: Self-pay | Admitting: Orthopedic Surgery

## 2021-01-25 ENCOUNTER — Ambulatory Visit: Payer: No Typology Code available for payment source | Admitting: Orthopedic Surgery

## 2021-01-25 NOTE — Telephone Encounter (Signed)
Received $25.00 cash,medical records release form and Attending physician's statement from patient/forwarding to [facility] today

## 2021-01-26 ENCOUNTER — Other Ambulatory Visit: Payer: Self-pay | Admitting: Orthopedic Surgery

## 2021-01-26 ENCOUNTER — Other Ambulatory Visit (HOSPITAL_COMMUNITY): Payer: Self-pay

## 2021-01-26 NOTE — Telephone Encounter (Signed)
Ok to fill thx

## 2021-01-27 ENCOUNTER — Other Ambulatory Visit (HOSPITAL_COMMUNITY): Payer: Self-pay

## 2021-01-27 MED ORDER — HYDROCODONE-ACETAMINOPHEN 5-325 MG PO TABS
1.0000 | ORAL_TABLET | Freq: Two times a day (BID) | ORAL | 0 refills | Status: DC | PRN
  Filled 2021-01-27: qty 20, 10d supply, fill #0

## 2021-02-01 ENCOUNTER — Other Ambulatory Visit (HOSPITAL_COMMUNITY): Payer: Self-pay

## 2021-02-02 ENCOUNTER — Other Ambulatory Visit (HOSPITAL_COMMUNITY): Payer: Self-pay

## 2021-02-02 MED ORDER — VALSARTAN-HYDROCHLOROTHIAZIDE 160-12.5 MG PO TABS
ORAL_TABLET | ORAL | 0 refills | Status: DC
  Filled 2021-02-02 – 2021-03-08 (×2): qty 30, 30d supply, fill #0

## 2021-02-03 ENCOUNTER — Other Ambulatory Visit (HOSPITAL_COMMUNITY): Payer: Self-pay

## 2021-02-08 ENCOUNTER — Other Ambulatory Visit (HOSPITAL_COMMUNITY): Payer: Self-pay

## 2021-02-08 ENCOUNTER — Ambulatory Visit: Payer: No Typology Code available for payment source | Admitting: Orthopedic Surgery

## 2021-02-08 MED ORDER — CITALOPRAM HYDROBROMIDE 10 MG PO TABS
ORAL_TABLET | ORAL | 0 refills | Status: DC
  Filled 2021-02-08: qty 50, 30d supply, fill #0

## 2021-02-08 MED ORDER — ESZOPICLONE 2 MG PO TABS
ORAL_TABLET | ORAL | 1 refills | Status: DC
  Filled 2021-02-08: qty 30, 30d supply, fill #0

## 2021-02-17 ENCOUNTER — Telehealth: Payer: Self-pay | Admitting: Orthopedic Surgery

## 2021-02-17 NOTE — Telephone Encounter (Signed)
Correction long term disability forms, medical release forms, and 25.00 cash payment to [employer]. Accepted 02/17/2021

## 2021-02-17 NOTE — Telephone Encounter (Signed)
Pt submitted medical release form, short term disability, and $25.00 cash payment to [employer]. Accepted 02/17/2021

## 2021-02-22 ENCOUNTER — Other Ambulatory Visit (HOSPITAL_COMMUNITY): Payer: Self-pay

## 2021-02-23 ENCOUNTER — Other Ambulatory Visit (HOSPITAL_COMMUNITY): Payer: Self-pay

## 2021-02-23 MED ORDER — METRONIDAZOLE 250 MG PO TABS
ORAL_TABLET | ORAL | 0 refills | Status: DC
  Filled 2021-02-23: qty 42, 14d supply, fill #0

## 2021-03-08 ENCOUNTER — Other Ambulatory Visit (HOSPITAL_COMMUNITY): Payer: Self-pay

## 2021-03-08 MED ORDER — MONTELUKAST SODIUM 10 MG PO TABS
ORAL_TABLET | ORAL | 0 refills | Status: DC
  Filled 2021-03-08: qty 90, 90d supply, fill #0

## 2021-03-19 ENCOUNTER — Other Ambulatory Visit: Payer: Self-pay | Admitting: Surgical

## 2021-03-19 ENCOUNTER — Other Ambulatory Visit (HOSPITAL_COMMUNITY): Payer: Self-pay

## 2021-03-22 ENCOUNTER — Telehealth: Payer: Self-pay | Admitting: Orthopedic Surgery

## 2021-03-22 ENCOUNTER — Other Ambulatory Visit (HOSPITAL_COMMUNITY): Payer: Self-pay

## 2021-03-22 MED ORDER — CELECOXIB 100 MG PO CAPS
100.0000 mg | ORAL_CAPSULE | Freq: Two times a day (BID) | ORAL | 0 refills | Status: DC
  Filled 2021-03-22: qty 60, 30d supply, fill #0

## 2021-03-22 NOTE — Telephone Encounter (Signed)
Pt called requesting a call from Angela Villanueva states she has medical states on her forms that are not correct. Please call pt about this matter at [phone]. ?

## 2021-03-22 NOTE — Telephone Encounter (Signed)
Pt needs medication called in to [facility]  ? ?Hydrocoden\Acet 5-325 ?Celecoxib ?

## 2021-03-22 NOTE — Telephone Encounter (Signed)
Tried calling. No answer. LMVM advising was returning call to discuss questions. Advised on VM to please leave as many details as possible when she returns call.  ?

## 2021-03-23 ENCOUNTER — Telehealth: Payer: Self-pay | Admitting: Orthopedic Surgery

## 2021-03-23 ENCOUNTER — Other Ambulatory Visit (HOSPITAL_COMMUNITY): Payer: Self-pay

## 2021-03-23 DIAGNOSIS — G8929 Other chronic pain: Secondary | ICD-10-CM

## 2021-03-23 DIAGNOSIS — M5412 Radiculopathy, cervical region: Secondary | ICD-10-CM

## 2021-03-23 DIAGNOSIS — M4802 Spinal stenosis, cervical region: Secondary | ICD-10-CM

## 2021-03-23 NOTE — Telephone Encounter (Signed)
FYI ? ?Spoke with patient. She wants pain management referral as she is not having surgery. Referral entered. ?

## 2021-03-23 NOTE — Telephone Encounter (Signed)
Please advise 

## 2021-03-23 NOTE — Telephone Encounter (Signed)
Pt called and states that she got her phone fixed and is ready for lauren to give her a call.  ? ?CB [phone]  ?

## 2021-03-23 NOTE — Telephone Encounter (Signed)
Patient called needing Rx refilled Celecoxib '100mg'$  and Hydrocodone 5-325 mg. The number to contact patient is [phone] ?

## 2021-03-23 NOTE — Telephone Encounter (Signed)
Celebrex was refilled yesterday. Don't think continual Hydrocodone RX's are a great idea but if she is in continued severe pain and doesn't want to seek a surgical solution, we can refer her to pain management

## 2021-03-23 NOTE — Telephone Encounter (Signed)
noted 

## 2021-03-23 NOTE — Telephone Encounter (Signed)
Addressed this in patient to betsy, okay to disregard

## 2021-03-23 NOTE — Telephone Encounter (Signed)
I called patient. She states that her paperwork from [employer] is incorrect and she needs it fixed and sent back to [employer]. She is concerned that it does not have all of the correct information on it. She states that she IS having fever, chills every 30 minutes (as she has timed this), dizziness to the point that she has to sit back down, and sees colored spots. She has also fallen a couple of times. I tried to explain that this is not what was on [employer] form, but in her office note. She says that it needs to be fixed and all of these symptoms added to the note. She is also very upset that it says her employer is [employer] [place] [employer] as she works at [employer] and does not even know where [facility] is. I tried to explain this was entered at some point in Epic, but she wants it fixed. She would like for all of this to be done and the new note faxed back to [facility] as she does not want [employer] to do anything for her because she has to continue to pay  $25 for their help. ?

## 2021-03-24 ENCOUNTER — Other Ambulatory Visit (HOSPITAL_COMMUNITY): Payer: Self-pay

## 2021-03-24 NOTE — Telephone Encounter (Signed)
Thank you for that

## 2021-03-30 NOTE — Telephone Encounter (Signed)
Also though symptoms are not really orthopedic symptoms per se.

## 2021-03-30 NOTE — Telephone Encounter (Signed)
IC s/w patient and advised  

## 2021-03-30 NOTE — Telephone Encounter (Signed)
I cannot really add  those symptoms or take away symptoms retroactively.  We can add them at the time of her next clinic visit.  I did add in the note that she used to work at [employer]

## 2021-04-06 ENCOUNTER — Encounter: Payer: Self-pay | Admitting: Physical Medicine & Rehabilitation

## 2021-04-14 ENCOUNTER — Other Ambulatory Visit (HOSPITAL_COMMUNITY): Payer: Self-pay

## 2021-04-14 MED ORDER — VALSARTAN-HYDROCHLOROTHIAZIDE 160-12.5 MG PO TABS
ORAL_TABLET | ORAL | 0 refills | Status: DC
  Filled 2021-04-14: qty 90, 90d supply, fill #0

## 2021-04-15 ENCOUNTER — Other Ambulatory Visit (HOSPITAL_COMMUNITY): Payer: Self-pay

## 2021-04-19 ENCOUNTER — Ambulatory Visit (INDEPENDENT_AMBULATORY_CARE_PROVIDER_SITE_OTHER): Payer: No Typology Code available for payment source | Admitting: Orthopedic Surgery

## 2021-04-19 ENCOUNTER — Other Ambulatory Visit: Payer: Self-pay | Admitting: Orthopedic Surgery

## 2021-04-19 DIAGNOSIS — M25511 Pain in right shoulder: Secondary | ICD-10-CM

## 2021-04-19 DIAGNOSIS — M5412 Radiculopathy, cervical region: Secondary | ICD-10-CM

## 2021-04-19 DIAGNOSIS — G8929 Other chronic pain: Secondary | ICD-10-CM

## 2021-04-20 LAB — CBC WITH DIFFERENTIAL/PLATELET
Absolute Monocytes: 490 cells/uL (ref 200–950)
Basophils Absolute: 41 cells/uL (ref 0–200)
Basophils Relative: 0.6 %
Eosinophils Absolute: 88 cells/uL (ref 15–500)
Eosinophils Relative: 1.3 %
HCT: 34.5 % — ABNORMAL LOW (ref 35.0–45.0)
Hemoglobin: 11.4 g/dL — ABNORMAL LOW (ref 11.7–15.5)
Lymphs Abs: 4175 cells/uL — ABNORMAL HIGH (ref 850–3900)
MCH: 29.8 pg (ref 27.0–33.0)
MCHC: 33 g/dL (ref 32.0–36.0)
MCV: 90.1 fL (ref 80.0–100.0)
MPV: 11.4 fL (ref 7.5–12.5)
Monocytes Relative: 7.2 %
Neutro Abs: 2006 cells/uL (ref 1500–7800)
Neutrophils Relative %: 29.5 %
Platelets: 193 10*3/uL (ref 140–400)
RBC: 3.83 10*6/uL (ref 3.80–5.10)
RDW: 11.9 % (ref 11.0–15.0)
Total Lymphocyte: 61.4 %
WBC: 6.8 10*3/uL (ref 3.8–10.8)

## 2021-04-20 LAB — C-REACTIVE PROTEIN: CRP: 0.2 mg/L (ref <8.0)

## 2021-04-20 LAB — SEDIMENTATION RATE: Sed Rate: 6 mm/h (ref 0–30)

## 2021-04-21 ENCOUNTER — Encounter: Payer: Self-pay | Admitting: Orthopedic Surgery

## 2021-04-21 LAB — THYROID PANEL
Free Thyroxine Index: 3.4 (ref 1.2–4.9)
T3 Uptake Ratio: 30 % (ref 24–39)
T4, Total: 11.4 ug/dL (ref 4.5–12.0)

## 2021-04-21 NOTE — Progress Notes (Signed)
Labs normal as expected including thyroid please call thanks

## 2021-04-21 NOTE — Progress Notes (Signed)
? ?Office Visit Note ?  ?Patient: Angela Villanueva           ?Date of Birth: Jul 31, 1955           ?MRN: [id number] ?Visit Date: 04/19/2021 ?Requested by: Jordan Hawks, [place] ?[address] ?[place],  [place] [address] ?PCP: Jordan Hawks, [place] ? ?Subjective: ?Chief Complaint  ?Patient presents with  ? Other  ?  Follow up neck/shoulder pain  ? ? ?HPI: Angela Villanueva is a 66 year old patient is 10 months out right shoulder replacement.  She also has definite neck pathology and radiculopathy.  She states she is doing about the same.  Reports having daily fever and chills.  Hard for her to lay on that right-hand side.  She is going to pain management with Dr. Kittie Plater.  She has had prior work-up of the shoulder for infection which was negative including laboratory values and radiographic evaluation.  She works as a Sports coach at [employer] but I do not anticipate her being able to return to work.  Do in part to the shoulder as well as her neck pathology. ?             ?ROS: All systems reviewed are negative as they relate to the chief complaint within the history of present illness.  Patient reports episodic fevers.. ?sessment & Plan: ?Visit Diagnoses:  ?1. Chronic right shoulder pain   ?2. Cervical radiculopathy   ? ? ?Plan: Impression is right shoulder pain.  The shoulder is pretty functional today on exam.  Laboratory values again are obtained today to check for infection.  CT scan pending also to evaluate soft tissue structures.  Functionally the shoulder is doing well.  No warmth around the incision.  Has pretty reasonable motion although it is subjectively painful to the patient.  Some of this could be coming from her neck as well.  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  Out of work 2 more months and then she will likely be released from my care at that time.  We can follow-up on the CT scan and laboratory values at that time as well. ? ?Follow-Up Instructions: Return in about 8 weeks  (around 06/14/2021).  ? ?Orders:  ?Orders Placed This Encounter  ?Procedures  ? CT SHOULDER RIGHT WO CONTRAST  ? CBC with Differential  ? Sed Rate (ESR)  ? C-reactive protein  ? Thyroid Profile  ? ?No orders of the defined types were placed in this encounter. ? ? ? ? Procedures: ?No procedures performed ? ? ?Clinical Data: ?No additional findings. ? ?Objective: ?Vital Signs: There were no vitals taken for this visit. ? ?Physical Exam:  ? ?Constitutional: Patient appears well-developed ?HEENT:  ?Head: Normocephalic ?Eyes:EOM are normal ?Neck: Normal range of motion ?Cardiovascular: Normal rate ?Pulmonary/chest: Effort normal ?Neurologic: Patient is alert ?Skin: Skin is warm ?Psychiatric: Patient has normal mood and affect ? ? ?Ortho Exam: Ortho exam demonstrates excellent infraspinatus supraspinatus subscap strength on the right-hand side.  No warmth or crepitus with shoulder range of motion.  Incision intact.  Motor or sensory function of the hand is intact no swelling in that right shoulder region. ? ?Specialty Comments:  ?No specialty comments available. ? ?Imaging: ?No results found. ? ? ?PMFS History: ?Patient Active Problem List  ? Diagnosis Date Noted  ? Shoulder arthritis   ? 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 Gastroparesis 09/01/2015  ? Abdominal pain, epigastric 01/22/2014  ? Esophageal reflux 05/02/2012  ? Dysphagia  05/02/2012  ? DUODENITIS, WITHOUT HEMORRHAGE 04/09/2007  ? Constipation 04/09/2007  ? History of peptic ulcer disease 04/09/2007  ? ?Past Medical History:  ?Diagnosis Date  ? Allergy   ? Anemia   ? Arthritis   ? Diverticulitis   ? Gastric ulcer   ? GERD (gastroesophageal reflux disease)   ? Hepatitis   ? Hypertension   ?  ?Family History  ?Problem Relation Age of Onset  ? COPD Mother   ? Cancer Mother   ? Other Mother   ?     blood cloth  ? Diabetes Other   ?  ?Past Surgical History:  ?Procedure Laterality Date  ? ABDOMINAL HYSTERECTOMY    ? EYE SURGERY    ? fibroid tumor removal    ?  FOOT SURGERY    ? LAPAROSCOPIC GASTROTOMY W/ REPAIR OF ULCER    ? PARTIAL HYSTERECTOMY    ? TONSILLECTOMY    ? AS CHILD  ? TOTAL SHOULDER ARTHROPLASTY Right 06/16/2020  ? Procedure: RIGHT TOTAL SHOULDER ARTHROPLASTY;  Surgeon: Meredith Pel, MD;  Location: [facility];  Service: Orthopedics;  Laterality: Right;  ? TUBAL LIGATION    ? ?Social History  ? ?Occupational History  ? Occupation: Custodian  ?  Employer: [employer]  ?Tobacco Use  ? Smoking status: Former  ?  Types: Cigarettes  ?  Quit date: 01/04/1992  ?  Years since quitting: 29.3  ? Smokeless tobacco: Never  ?Vaping Use  ? Vaping Use: Never used  ?Substance and Sexual Activity  ? Alcohol use: No  ? Drug use: No  ? Sexual activity: Not Currently  ? ? ? ? ? ?

## 2021-04-22 ENCOUNTER — Ambulatory Visit
Admission: RE | Admit: 2021-04-22 | Discharge: 2021-04-22 | Disposition: A | Discharge status: Home / Self Care | Payer: No Typology Code available for payment source | Source: Ambulatory Visit | Attending: Orthopedic Surgery | Admitting: Orthopedic Surgery

## 2021-04-22 DIAGNOSIS — G8929 Other chronic pain: Secondary | ICD-10-CM

## 2021-04-22 DIAGNOSIS — M5412 Radiculopathy, cervical region: Secondary | ICD-10-CM

## 2021-04-22 NOTE — Progress Notes (Signed)
I called advised. Patient verbalized understanding.  ?

## 2021-04-23 ENCOUNTER — Ambulatory Visit: Payer: No Typology Code available for payment source | Admitting: Orthopedic Surgery

## 2021-05-07 ENCOUNTER — Other Ambulatory Visit (HOSPITAL_COMMUNITY): Payer: Self-pay

## 2021-05-10 ENCOUNTER — Other Ambulatory Visit (HOSPITAL_COMMUNITY): Payer: Self-pay

## 2021-05-10 MED ORDER — PANTOPRAZOLE SODIUM 40 MG PO TBEC
DELAYED_RELEASE_TABLET | ORAL | 3 refills | Status: DC
  Filled 2021-05-10: qty 90, 90d supply, fill #0
  Filled 2021-08-11: qty 90, 90d supply, fill #1
  Filled 2022-02-04: qty 30, 30d supply, fill #2
  Filled 2022-03-03: qty 30, 30d supply, fill #3
  Filled 2022-03-22: qty 30, 30d supply, fill #4
  Filled 2022-04-21: qty 30, 30d supply, fill #5

## 2021-05-28 ENCOUNTER — Ambulatory Visit: Payer: No Typology Code available for payment source | Admitting: Physical Medicine & Rehabilitation

## 2021-06-01 ENCOUNTER — Other Ambulatory Visit (HOSPITAL_COMMUNITY): Payer: Self-pay

## 2021-06-09 ENCOUNTER — Other Ambulatory Visit (HOSPITAL_COMMUNITY): Payer: Self-pay

## 2021-06-09 ENCOUNTER — Other Ambulatory Visit: Payer: Self-pay | Admitting: Surgical

## 2021-06-09 MED ORDER — CELECOXIB 100 MG PO CAPS
100.0000 mg | ORAL_CAPSULE | Freq: Two times a day (BID) | ORAL | 0 refills | Status: DC
  Filled 2021-06-09: qty 60, 30d supply, fill #0

## 2021-06-21 ENCOUNTER — Ambulatory Visit: Payer: No Typology Code available for payment source | Admitting: Orthopedic Surgery

## 2021-06-25 ENCOUNTER — Ambulatory Visit (INDEPENDENT_AMBULATORY_CARE_PROVIDER_SITE_OTHER): Payer: No Typology Code available for payment source | Admitting: Orthopedic Surgery

## 2021-06-25 ENCOUNTER — Telehealth: Payer: Self-pay | Admitting: Orthopedic Surgery

## 2021-06-25 DIAGNOSIS — G8929 Other chronic pain: Secondary | ICD-10-CM

## 2021-06-25 DIAGNOSIS — M25511 Pain in right shoulder: Secondary | ICD-10-CM | POA: Diagnosis not present

## 2021-06-25 DIAGNOSIS — M5412 Radiculopathy, cervical region: Secondary | ICD-10-CM | POA: Diagnosis not present

## 2021-06-25 NOTE — Telephone Encounter (Signed)
Received $25.00 cash,medical records release form and the attending physician's statement     Forwarding to [employer] today

## 2021-06-26 ENCOUNTER — Encounter: Payer: Self-pay | Admitting: Orthopedic Surgery

## 2021-06-28 ENCOUNTER — Ambulatory Visit: Payer: No Typology Code available for payment source | Admitting: Orthopedic Surgery

## 2021-06-28 ENCOUNTER — Other Ambulatory Visit (HOSPITAL_COMMUNITY): Payer: Self-pay

## 2021-06-29 ENCOUNTER — Other Ambulatory Visit (HOSPITAL_COMMUNITY): Payer: Self-pay

## 2021-07-30 ENCOUNTER — Other Ambulatory Visit (HOSPITAL_COMMUNITY): Payer: Self-pay

## 2021-08-11 ENCOUNTER — Other Ambulatory Visit (HOSPITAL_COMMUNITY): Payer: Self-pay

## 2021-08-17 ENCOUNTER — Other Ambulatory Visit (HOSPITAL_COMMUNITY): Payer: Self-pay

## 2021-08-18 ENCOUNTER — Other Ambulatory Visit: Payer: Self-pay | Admitting: Surgical

## 2021-08-18 ENCOUNTER — Other Ambulatory Visit (HOSPITAL_COMMUNITY): Payer: Self-pay

## 2021-08-18 MED ORDER — CELECOXIB 100 MG PO CAPS
100.0000 mg | ORAL_CAPSULE | Freq: Two times a day (BID) | ORAL | 0 refills | Status: DC
  Filled 2021-08-18: qty 60, 30d supply, fill #0

## 2021-08-18 MED ORDER — CYCLOSPORINE 0.05 % OP EMUL
1.0000 [drp] | Freq: Two times a day (BID) | OPHTHALMIC | 3 refills | Status: DC
  Filled 2021-08-18: qty 60, 30d supply, fill #0

## 2021-08-19 ENCOUNTER — Other Ambulatory Visit (HOSPITAL_COMMUNITY): Payer: Self-pay

## 2021-08-19 MED ORDER — CYCLOSPORINE 0.05 % OP EMUL
1.0000 [drp] | Freq: Two times a day (BID) | OPHTHALMIC | 4 refills | Status: DC
  Filled 2021-08-19 – 2021-08-23 (×3): qty 180, 90d supply, fill #0

## 2021-08-23 ENCOUNTER — Other Ambulatory Visit (HOSPITAL_COMMUNITY): Payer: Self-pay

## 2021-08-30 ENCOUNTER — Other Ambulatory Visit (HOSPITAL_COMMUNITY): Payer: Self-pay

## 2021-09-08 ENCOUNTER — Other Ambulatory Visit (HOSPITAL_COMMUNITY): Payer: Self-pay

## 2021-09-13 ENCOUNTER — Other Ambulatory Visit (HOSPITAL_COMMUNITY): Payer: Self-pay

## 2021-09-13 MED ORDER — CITALOPRAM HYDROBROMIDE 10 MG PO TABS: ORAL_TABLET | ORAL | 0 refills | Status: DC

## 2021-09-13 MED ORDER — VALSARTAN-HYDROCHLOROTHIAZIDE 160-12.5 MG PO TABS
ORAL_TABLET | ORAL | 1 refills | Status: DC
  Filled 2021-09-13: qty 90, 90d supply, fill #0
  Filled 2021-12-06: qty 90, 90d supply, fill #1

## 2021-09-13 MED ORDER — SIMETHICONE 125 MG PO CHEW: CHEWABLE_TABLET | ORAL | 1 refills | Status: DC

## 2021-09-13 MED ORDER — CITALOPRAM HYDROBROMIDE 10 MG PO TABS
ORAL_TABLET | ORAL | 0 refills | Status: DC
  Filled 2021-09-13: qty 50, 30d supply, fill #0

## 2021-09-16 ENCOUNTER — Other Ambulatory Visit (HOSPITAL_COMMUNITY): Payer: Self-pay

## 2021-09-22 ENCOUNTER — Other Ambulatory Visit (HOSPITAL_COMMUNITY): Payer: Self-pay

## 2021-09-25 ENCOUNTER — Other Ambulatory Visit (HOSPITAL_COMMUNITY): Payer: Self-pay

## 2021-09-27 ENCOUNTER — Other Ambulatory Visit (HOSPITAL_COMMUNITY): Payer: Self-pay

## 2021-10-19 ENCOUNTER — Other Ambulatory Visit (HOSPITAL_COMMUNITY): Payer: Self-pay

## 2021-10-19 MED ORDER — NA SULFATE-K SULFATE-MG SULF 17.5-3.13-1.6 GM/177ML PO SOLN
ORAL | 0 refills | Status: DC
  Filled 2021-10-19: qty 354, 1d supply, fill #0

## 2021-10-19 MED ORDER — LINZESS 145 MCG PO CAPS
145.0000 ug | ORAL_CAPSULE | Freq: Every day | ORAL | 3 refills | Status: DC
  Filled 2021-10-19: qty 30, 30d supply, fill #0
  Filled 2021-11-15: qty 30, 30d supply, fill #1

## 2021-10-20 ENCOUNTER — Other Ambulatory Visit: Payer: Self-pay | Admitting: Gastroenterology

## 2021-10-20 DIAGNOSIS — R1084 Generalized abdominal pain: Secondary | ICD-10-CM

## 2021-10-20 DIAGNOSIS — R140 Abdominal distension (gaseous): Secondary | ICD-10-CM

## 2021-10-20 DIAGNOSIS — K5900 Constipation, unspecified: Secondary | ICD-10-CM

## 2021-10-20 DIAGNOSIS — R112 Nausea with vomiting, unspecified: Secondary | ICD-10-CM

## 2021-10-20 DIAGNOSIS — R109 Unspecified abdominal pain: Secondary | ICD-10-CM

## 2021-10-25 ENCOUNTER — Other Ambulatory Visit (HOSPITAL_COMMUNITY): Payer: Self-pay

## 2021-10-26 ENCOUNTER — Other Ambulatory Visit (HOSPITAL_COMMUNITY): Payer: Self-pay

## 2021-10-28 ENCOUNTER — Other Ambulatory Visit (HOSPITAL_COMMUNITY): Payer: Self-pay

## 2021-10-28 ENCOUNTER — Other Ambulatory Visit: Payer: Self-pay | Admitting: Surgical

## 2021-10-28 MED ORDER — CELECOXIB 100 MG PO CAPS
100.0000 mg | ORAL_CAPSULE | Freq: Two times a day (BID) | ORAL | 0 refills | Status: DC
  Filled 2021-10-28: qty 60, 30d supply, fill #0

## 2021-11-06 ENCOUNTER — Other Ambulatory Visit (HOSPITAL_COMMUNITY): Payer: Self-pay

## 2021-11-15 ENCOUNTER — Other Ambulatory Visit (HOSPITAL_COMMUNITY): Payer: Self-pay

## 2021-11-24 ENCOUNTER — Other Ambulatory Visit: Payer: No Typology Code available for payment source

## 2021-11-29 ENCOUNTER — Ambulatory Visit
Admission: RE | Admit: 2021-11-29 | Discharge: 2021-11-29 | Disposition: A | Discharge status: Home / Self Care | Payer: Commercial Managed Care - HMO | Source: Ambulatory Visit | Attending: Gastroenterology | Admitting: Gastroenterology

## 2021-11-29 DIAGNOSIS — R1084 Generalized abdominal pain: Secondary | ICD-10-CM

## 2021-11-29 DIAGNOSIS — R112 Nausea with vomiting, unspecified: Secondary | ICD-10-CM

## 2021-11-29 DIAGNOSIS — K5900 Constipation, unspecified: Secondary | ICD-10-CM

## 2021-11-29 DIAGNOSIS — R140 Abdominal distension (gaseous): Secondary | ICD-10-CM

## 2021-11-29 DIAGNOSIS — R109 Unspecified abdominal pain: Secondary | ICD-10-CM

## 2021-11-29 MED ORDER — IOPAMIDOL (ISOVUE-300) INJECTION 61%
100.0000 mL | Freq: Once | INTRAVENOUS | Status: AC | PRN
  Administered 2021-11-29: 100 mL via INTRAVENOUS

## 2021-12-02 ENCOUNTER — Other Ambulatory Visit (HOSPITAL_COMMUNITY): Payer: Self-pay

## 2021-12-02 MED ORDER — BIS SUBCIT-METRONID-TETRACYC 140-125-125 MG PO CAPS
ORAL_CAPSULE | ORAL | 0 refills | Status: DC
  Filled 2021-12-02: qty 120, 10d supply, fill #0

## 2021-12-06 ENCOUNTER — Other Ambulatory Visit (HOSPITAL_COMMUNITY): Payer: Self-pay

## 2021-12-07 ENCOUNTER — Other Ambulatory Visit (HOSPITAL_COMMUNITY): Payer: Self-pay

## 2022-01-07 ENCOUNTER — Other Ambulatory Visit (HOSPITAL_COMMUNITY): Payer: Self-pay

## 2022-01-07 MED ORDER — LUBIPROSTONE 24 MCG PO CAPS
ORAL_CAPSULE | ORAL | 3 refills | Status: DC
  Filled 2022-01-07: qty 60, 30d supply, fill #0

## 2022-01-10 ENCOUNTER — Other Ambulatory Visit (HOSPITAL_COMMUNITY): Payer: Self-pay

## 2022-01-10 ENCOUNTER — Other Ambulatory Visit: Payer: Self-pay

## 2022-01-10 MED ORDER — MOTEGRITY 2 MG PO TABS
2.0000 mg | ORAL_TABLET | Freq: Every day | ORAL | 3 refills | Status: DC
  Filled 2022-01-10: qty 30, 30d supply, fill #0

## 2022-01-11 ENCOUNTER — Other Ambulatory Visit (HOSPITAL_COMMUNITY): Payer: Self-pay

## 2022-01-17 ENCOUNTER — Other Ambulatory Visit (HOSPITAL_COMMUNITY): Payer: Self-pay

## 2022-02-04 ENCOUNTER — Other Ambulatory Visit (HOSPITAL_COMMUNITY): Payer: Self-pay

## 2022-02-05 ENCOUNTER — Other Ambulatory Visit (HOSPITAL_COMMUNITY): Payer: Self-pay

## 2022-02-07 ENCOUNTER — Other Ambulatory Visit (HOSPITAL_COMMUNITY): Payer: Self-pay

## 2022-02-07 MED ORDER — CELECOXIB 200 MG PO CAPS
200.0000 mg | ORAL_CAPSULE | Freq: Every day | ORAL | 1 refills | Status: DC
  Filled 2022-02-07: qty 30, 30d supply, fill #0
  Filled 2022-03-03: qty 30, 30d supply, fill #1

## 2022-02-22 ENCOUNTER — Other Ambulatory Visit (HOSPITAL_COMMUNITY): Payer: Self-pay

## 2022-02-28 ENCOUNTER — Telehealth: Payer: Self-pay | Admitting: Gastroenterology

## 2022-02-28 NOTE — Telephone Encounter (Signed)
Good morning Dr. Loletha Carrow,   We have received records from [facility] for patient to transfer her care back to you. Patient has a referral in from Dr. Evalyn Casco to be seen for  pelvic floor dysfunction in female. Patient was a previous patient of yours with our practice in 2017 and then transferred her care to Dr. Ronnette Juniper. Patients records include an office visit from August of 2022, an endoscopy in November of 2022 with pathology report, as well as a colonoscopy in January of 2023 with pathology report. I will be sending patients records for you to review, will you please review and advise on scheduling patient?   Thank you.

## 2022-03-03 ENCOUNTER — Other Ambulatory Visit (HOSPITAL_COMMUNITY): Payer: Self-pay

## 2022-03-03 NOTE — Telephone Encounter (Signed)
Available records reviewed.  After being seen at [facility] for years she then transferred care to a [facility] practice, having been seen there in October 2023 and last month.  Transferring her care back again to this practice is not in her best interest for continuity of care, and I am not accepting the transfer.  Recent notes indicates this patient is suspected to have a probable pelvic floor dysfunction, which would be best evaluated and managed by her current GI practice or referral to an academic referral center if further assistance is needed.  -HD

## 2022-03-03 NOTE — Telephone Encounter (Signed)
LVM for patient giving recommendations of Dr. Loletha Carrow.

## 2022-03-04 ENCOUNTER — Other Ambulatory Visit (HOSPITAL_COMMUNITY): Payer: Self-pay

## 2022-03-07 ENCOUNTER — Other Ambulatory Visit (HOSPITAL_COMMUNITY): Payer: Self-pay

## 2022-03-07 DIAGNOSIS — E782 Mixed hyperlipidemia: Secondary | ICD-10-CM | POA: Diagnosis not present

## 2022-03-07 DIAGNOSIS — K5900 Constipation, unspecified: Secondary | ICD-10-CM | POA: Diagnosis not present

## 2022-03-07 MED ORDER — CITALOPRAM HYDROBROMIDE 10 MG PO TABS
ORAL_TABLET | ORAL | 0 refills | Status: AC
  Filled 2022-03-07: qty 50, 30d supply, fill #0

## 2022-03-07 MED ORDER — LINACLOTIDE 290 MCG PO CAPS
290.0000 ug | ORAL_CAPSULE | Freq: Every day | ORAL | 0 refills | Status: DC
  Filled 2022-03-07: qty 90, 90d supply, fill #0

## 2022-03-22 ENCOUNTER — Other Ambulatory Visit (HOSPITAL_COMMUNITY): Payer: Self-pay

## 2022-03-28 ENCOUNTER — Telehealth: Payer: Self-pay | Admitting: Orthopedic Surgery

## 2022-03-28 NOTE — Telephone Encounter (Signed)
Received vm from Philo @ [facility], requesting notes after 12/15/20 to be faxed to [phone]. I faxed all progress notes post 12/15/20 to [phone], no callback was left.

## 2022-04-04 ENCOUNTER — Other Ambulatory Visit: Payer: Self-pay

## 2022-04-04 ENCOUNTER — Other Ambulatory Visit (HOSPITAL_COMMUNITY): Payer: Self-pay

## 2022-04-04 DIAGNOSIS — I10 Essential (primary) hypertension: Secondary | ICD-10-CM | POA: Diagnosis not present

## 2022-04-04 DIAGNOSIS — E782 Mixed hyperlipidemia: Secondary | ICD-10-CM | POA: Diagnosis not present

## 2022-04-04 DIAGNOSIS — K219 Gastro-esophageal reflux disease without esophagitis: Secondary | ICD-10-CM | POA: Diagnosis not present

## 2022-04-04 DIAGNOSIS — M130 Polyarthritis, unspecified: Secondary | ICD-10-CM | POA: Diagnosis not present

## 2022-04-04 DIAGNOSIS — K5900 Constipation, unspecified: Secondary | ICD-10-CM | POA: Diagnosis not present

## 2022-04-04 MED ORDER — CITALOPRAM HYDROBROMIDE 20 MG PO TABS: 30.0000 mg | ORAL_TABLET | Freq: Every day | ORAL | 0 refills | Status: DC

## 2022-04-04 MED ORDER — CITALOPRAM HYDROBROMIDE 20 MG PO TABS
30.0000 mg | ORAL_TABLET | Freq: Every day | ORAL | 0 refills | Status: DC
  Filled 2022-04-04: qty 135, 90d supply, fill #0

## 2022-04-05 ENCOUNTER — Other Ambulatory Visit (HOSPITAL_COMMUNITY): Payer: Self-pay

## 2022-04-06 ENCOUNTER — Other Ambulatory Visit (HOSPITAL_COMMUNITY): Payer: Self-pay

## 2022-04-07 ENCOUNTER — Other Ambulatory Visit (HOSPITAL_COMMUNITY): Payer: Self-pay

## 2022-04-08 ENCOUNTER — Other Ambulatory Visit (HOSPITAL_COMMUNITY): Payer: Self-pay

## 2022-04-09 ENCOUNTER — Other Ambulatory Visit (HOSPITAL_COMMUNITY): Payer: Self-pay

## 2022-04-11 ENCOUNTER — Other Ambulatory Visit (HOSPITAL_COMMUNITY): Payer: Self-pay

## 2022-04-20 DIAGNOSIS — H0102A Squamous blepharitis right eye, upper and lower eyelids: Secondary | ICD-10-CM | POA: Diagnosis not present

## 2022-04-20 DIAGNOSIS — H35412 Lattice degeneration of retina, left eye: Secondary | ICD-10-CM | POA: Diagnosis not present

## 2022-04-20 DIAGNOSIS — H0102B Squamous blepharitis left eye, upper and lower eyelids: Secondary | ICD-10-CM | POA: Diagnosis not present

## 2022-04-21 ENCOUNTER — Other Ambulatory Visit (HOSPITAL_COMMUNITY): Payer: Self-pay

## 2022-04-22 ENCOUNTER — Other Ambulatory Visit (HOSPITAL_COMMUNITY): Payer: Self-pay

## 2022-04-25 ENCOUNTER — Other Ambulatory Visit: Payer: Self-pay

## 2022-04-25 ENCOUNTER — Other Ambulatory Visit (HOSPITAL_COMMUNITY): Payer: Self-pay

## 2022-04-25 DIAGNOSIS — H02886 Meibomian gland dysfunction of left eye, unspecified eyelid: Secondary | ICD-10-CM | POA: Diagnosis not present

## 2022-04-25 DIAGNOSIS — H02883 Meibomian gland dysfunction of right eye, unspecified eyelid: Secondary | ICD-10-CM | POA: Diagnosis not present

## 2022-04-25 MED ORDER — CELECOXIB 200 MG PO CAPS
200.0000 mg | ORAL_CAPSULE | Freq: Every day | ORAL | 1 refills | Status: DC
  Filled 2022-04-25: qty 30, 30d supply, fill #0
  Filled 2022-06-29: qty 30, 30d supply, fill #1

## 2022-04-25 MED ORDER — DOXYCYCLINE HYCLATE 100 MG PO TABS
100.0000 mg | ORAL_TABLET | Freq: Every day | ORAL | 2 refills | Status: DC
  Filled 2022-04-25: qty 30, 30d supply, fill #0
  Filled 2022-06-29: qty 30, 30d supply, fill #1

## 2022-04-25 MED ORDER — PREDNISOLONE ACETATE 1 % OP SUSP
1.0000 [drp] | Freq: Four times a day (QID) | OPHTHALMIC | 0 refills | Status: DC
  Filled 2022-04-25: qty 10, 50d supply, fill #0

## 2022-04-26 ENCOUNTER — Other Ambulatory Visit (HOSPITAL_COMMUNITY): Payer: Self-pay

## 2022-05-16 ENCOUNTER — Other Ambulatory Visit (HOSPITAL_COMMUNITY): Payer: Self-pay

## 2022-05-16 DIAGNOSIS — I10 Essential (primary) hypertension: Secondary | ICD-10-CM | POA: Diagnosis not present

## 2022-05-16 DIAGNOSIS — K5900 Constipation, unspecified: Secondary | ICD-10-CM | POA: Diagnosis not present

## 2022-05-16 DIAGNOSIS — M130 Polyarthritis, unspecified: Secondary | ICD-10-CM | POA: Diagnosis not present

## 2022-05-16 DIAGNOSIS — J399 Disease of upper respiratory tract, unspecified: Secondary | ICD-10-CM | POA: Diagnosis not present

## 2022-05-16 MED ORDER — PREDNISOLONE ACETATE 1 % OP SUSP
1.0000 [drp] | Freq: Four times a day (QID) | OPHTHALMIC | 0 refills | Status: DC
  Filled 2022-05-16: qty 10, 25d supply, fill #0

## 2022-05-25 ENCOUNTER — Other Ambulatory Visit (HOSPITAL_COMMUNITY): Payer: Self-pay

## 2022-06-27 DIAGNOSIS — E782 Mixed hyperlipidemia: Secondary | ICD-10-CM | POA: Diagnosis not present

## 2022-06-27 DIAGNOSIS — M130 Polyarthritis, unspecified: Secondary | ICD-10-CM | POA: Diagnosis not present

## 2022-06-27 DIAGNOSIS — I10 Essential (primary) hypertension: Secondary | ICD-10-CM | POA: Diagnosis not present

## 2022-06-27 DIAGNOSIS — K219 Gastro-esophageal reflux disease without esophagitis: Secondary | ICD-10-CM | POA: Diagnosis not present

## 2022-06-29 ENCOUNTER — Other Ambulatory Visit (HOSPITAL_COMMUNITY): Payer: Self-pay

## 2022-06-29 MED ORDER — PANTOPRAZOLE SODIUM 40 MG PO TBEC
40.0000 mg | DELAYED_RELEASE_TABLET | Freq: Every day | ORAL | 3 refills | Status: DC
  Filled 2022-06-29: qty 90, 90d supply, fill #0

## 2022-06-30 ENCOUNTER — Other Ambulatory Visit: Payer: Self-pay

## 2022-08-02 ENCOUNTER — Ambulatory Visit: Payer: Medicare Other | Admitting: Family

## 2022-08-08 DIAGNOSIS — I10 Essential (primary) hypertension: Secondary | ICD-10-CM | POA: Diagnosis not present

## 2022-08-08 DIAGNOSIS — K5909 Other constipation: Secondary | ICD-10-CM | POA: Diagnosis not present

## 2022-08-18 DIAGNOSIS — R142 Eructation: Secondary | ICD-10-CM | POA: Diagnosis not present

## 2022-09-01 DIAGNOSIS — I10 Essential (primary) hypertension: Secondary | ICD-10-CM | POA: Diagnosis not present

## 2022-09-01 DIAGNOSIS — M130 Polyarthritis, unspecified: Secondary | ICD-10-CM | POA: Diagnosis not present

## 2022-09-30 ENCOUNTER — Ambulatory Visit (INDEPENDENT_AMBULATORY_CARE_PROVIDER_SITE_OTHER): Payer: Medicare Other | Admitting: Family

## 2022-09-30 ENCOUNTER — Encounter: Payer: Self-pay | Admitting: Family

## 2022-09-30 ENCOUNTER — Other Ambulatory Visit: Payer: Self-pay | Admitting: Family

## 2022-09-30 VITALS — BP 120/80 | HR 68 | Temp 97.6°F | Resp 18 | Ht 64.0 in | Wt 120.0 lb

## 2022-09-30 DIAGNOSIS — K2100 Gastro-esophageal reflux disease with esophagitis, without bleeding: Secondary | ICD-10-CM

## 2022-09-30 DIAGNOSIS — E782 Mixed hyperlipidemia: Secondary | ICD-10-CM | POA: Diagnosis not present

## 2022-09-30 DIAGNOSIS — M79675 Pain in left toe(s): Secondary | ICD-10-CM

## 2022-09-30 DIAGNOSIS — I10 Essential (primary) hypertension: Secondary | ICD-10-CM

## 2022-09-30 DIAGNOSIS — Z23 Encounter for immunization: Secondary | ICD-10-CM

## 2022-09-30 DIAGNOSIS — Z7689 Persons encountering health services in other specified circumstances: Secondary | ICD-10-CM | POA: Diagnosis not present

## 2022-09-30 MED ORDER — PANTOPRAZOLE SODIUM 40 MG PO TBEC: DELAYED_RELEASE_TABLET | ORAL | 1 refills | Status: DC

## 2022-09-30 MED ORDER — CELECOXIB 200 MG PO CAPS: 200.0000 mg | ORAL_CAPSULE | Freq: Every day | ORAL | 1 refills | Status: DC

## 2022-09-30 MED ORDER — NYSTATIN 100000 UNIT/ML MT SUSP: 5.0000 mL | Freq: Three times a day (TID) | OROMUCOSAL | 3 refills | Status: DC

## 2022-09-30 MED ORDER — CITALOPRAM HYDROBROMIDE 10 MG PO TABS: ORAL_TABLET | ORAL | 1 refills | Status: DC

## 2022-09-30 NOTE — Progress Notes (Unsigned)
Provider: Richarda Blade FNP-C   Baily Hovanec, Donalee Citrin, NP  Patient Care Team: Raphaella Larkin, Donalee Citrin, NP as PCP - General (Family Medicine)  Extended Emergency Contact Information Primary Emergency Contact: Tamala Ser [place] Home Phone: [phone] Mobile Phone: [phone] Relation: Spouse Secondary Emergency Contact: Bell,Barbara Mobile Phone: [phone] Relation: Relative Preferred language: English Interpreter needed? No  Code Status:  Full Code  Goals of care: Advanced Directive information    09/30/2022    2:38 PM  Advanced Directives  Does Patient Have a Medical Advance Directive? No     Chief Complaint  Patient presents with   Establish Care    HPI:  Pt is a 67 y.o. female seen today establish care here at [facility] and Adult  care for medical management of chronic diseases  GERD - states takes Protonix but not Famotidine.States acid makes her loss her voice.    Constipation - states has used colace,Linzess and Miralax without any relief.has bloating and gas.states gas affects has voice. Immunization - States had a COVID-19 and PNA vaccine at the pharmacy. She will get her flu shot today.  States has appointment for mammogram on Monday 10/03/2022.   She does exercise by running after grand children.Also goes to the [facility] for 45 minutes  -1 hr   Past Medical History:  Diagnosis Date   Allergy    Anemia    Arthritis    Diverticulitis    Gastric ulcer    GERD (gastroesophageal reflux disease)    Hepatitis    Hypertension    Past Surgical History:  Procedure Laterality Date   ABDOMINAL HYSTERECTOMY     EYE SURGERY     fibroid tumor removal     FOOT SURGERY     LAPAROSCOPIC GASTROTOMY W/ REPAIR OF ULCER     PARTIAL HYSTERECTOMY     TONSILLECTOMY     AS CHILD   TOTAL SHOULDER ARTHROPLASTY Right 06/16/2020   Procedure: RIGHT TOTAL SHOULDER ARTHROPLASTY;  Surgeon: Cammy Copa, MD;  Location: [facility] OR;  Service: Orthopedics;   Laterality: Right;   TUBAL LIGATION      Allergies  Allergen Reactions   Ampicillin Anaphylaxis and Other (See Comments)    Has patient had a PCN reaction causing immediate rash, facial/tongue/throat swelling, SOB or lightheadedness with hypotension: yes Has patient had a PCN reaction causing severe rash involving mucus membranes or skin necrosis: no Has patient had a PCN reaction that required hospitalization no Has patient had a PCN reaction occurring within the last 10 years: no If all of the above answers are "NO", then may proceed with Cephalosporin use.    Penicillins Hives, Rash and Other (See Comments)    Has patient had a PCN reaction causing immediate rash, facial/tongue/throat swelling, SOB or lightheadedness with hypotension: yes Has patient had a PCN reaction causing severe rash involving mucus membranes or skin necrosis: no Has patient had a PCN reaction that required hospitalization yes - reaction sent me to hospital Has patient had a PCN reaction occurring within the last 10 years: no If all of the above answers are "NO", then may proceed with Cephalosporin use.     Allergies as of 09/30/2022       Reactions   Ampicillin Anaphylaxis, Other (See Comments)   Has patient had a PCN reaction causing immediate rash, facial/tongue/throat swelling, SOB or lightheadedness with hypotension: yes Has patient had a PCN reaction causing severe rash involving mucus membranes or skin necrosis: no Has patient had  a PCN reaction that required hospitalization no Has patient had a PCN reaction occurring within the last 10 years: no If all of the above answers are "NO", then may proceed with Cephalosporin use.   Penicillins Hives, Rash, Other (See Comments)   Has patient had a PCN reaction causing immediate rash, facial/tongue/throat swelling, SOB or lightheadedness with hypotension: yes Has patient had a PCN reaction causing severe rash involving mucus membranes or skin necrosis: no Has  patient had a PCN reaction that required hospitalization yes - reaction sent me to hospital Has patient had a PCN reaction occurring within the last 10 years: no If all of the above answers are "NO", then may proceed with Cephalosporin use.        Medication List        Accurate as of September 30, 2022  3:00 PM. If you have any questions, ask your nurse or doctor.          STOP taking these medications    amLODipine 5 MG tablet Commonly known as: NORVASC Stopped by: Donalee Citrin Annabell Oconnor   aspirin EC 81 MG tablet Stopped by: Donalee Citrin Karine Garn   bismuth-metronidazole-tetracycline [phone] MG capsule Commonly known as: PYLERA Stopped by: Donalee Citrin Ronasia Isola   cyclobenzaprine 5 MG tablet Commonly known as: FLEXERIL Stopped by: Donalee Citrin Charna Neeb   cycloSPORINE 0.05 % ophthalmic emulsion Commonly known as: RESTASIS Stopped by: Donalee Citrin Antaniya Venuti   doxycycline 100 MG tablet Commonly known as: VIBRA-TABS Stopped by: Donalee Citrin Zendaya Groseclose   eszopiclone 2 MG Tabs tablet Commonly known as: LUNESTA Stopped by: Donalee Citrin Xan Sparkman   ferrous sulfate 325 (65 FE) MG tablet Stopped by: Donalee Citrin Cheryl Stabenow   HYDROcodone-acetaminophen 5-325 MG tablet Commonly known as: NORCO/VICODIN Stopped by: Donalee Citrin Dashonna Chagnon   linaclotide 290 MCG Caps capsule Commonly known as: IT consultant Stopped by: Donalee Citrin Keinan Brouillet   Linzess 145 MCG Caps capsule Generic drug: linaclotide Stopped by: Donalee Citrin Luetta Piazza   lubiprostone 24 MCG capsule Commonly known as: AMITIZA Stopped by: Donalee Citrin Mathews Stuhr   methocarbamol 500 MG tablet Commonly known as: ROBAXIN Stopped by: Donalee Citrin Dorthie Santini   metroNIDAZOLE 250 MG tablet Commonly known as: FLAGYL Stopped by: Donalee Citrin Yalanda Soderman   montelukast 10 MG tablet Commonly known as: SINGULAIR Stopped by: Donalee Citrin Lorah Kalina   Motegrity 2 MG Tabs Generic drug: Prucalopride Succinate Stopped by: Donalee Citrin Keng Jewel   multivitamin with minerals tablet Stopped by: Donalee Citrin Shania Bjelland   Na Sulfate-K  Sulfate-Mg Sulf 17.5-3.13-1.6 GM/177ML Soln Stopped by: Donalee Citrin Gregory Barrick   omeprazole 40 MG capsule Commonly known as: PRILOSEC Stopped by: Donalee Citrin Yilia Sacca   polyethylene glycol-electrolytes 420 g solution Commonly known as: NuLYTELY Stopped by: Donalee Citrin Eural Holzschuh   prednisoLONE acetate 1 % ophthalmic suspension Commonly known as: PRED FORTE Stopped by: Kaleeyah Cuffie C Timera Windt   Restasis 0.05 % ophthalmic emulsion Generic drug: cycloSPORINE Stopped by: Donalee Citrin Benton Tooker   simethicone 125 MG chewable tablet Commonly known as: Phazyme Stopped by: Donalee Citrin Athena Baltz   valsartan-hydrochlorothiazide 160-12.5 MG tablet Commonly known as: DIOVAN-HCT Stopped by: Donalee Citrin Nuala Chiles   VICKS DAYQUIL SINUS PO Stopped by: Donalee Citrin Akhila Mahnken   Xifaxan 550 MG Tabs tablet Generic drug: rifaximin Stopped by: Donalee Citrin Kamdon Reisig       TAKE these medications    celecoxib 200 MG capsule Commonly known as: CELEBREX Take 1 capsule (200 mg total) by mouth daily. What changed: Another medication with the same name was removed. Continue taking this medication, and  follow the directions you see here. Changed by: Donalee Citrin Joniece Smotherman   citalopram 10 MG tablet Commonly known as: CELEXA Take 1 tablet by mouth every morning for 10 days then increase to 2 tablets by mouth every  morning What changed:  Another medication with the same name was changed. Make sure you understand how and when to take each. Another medication with the same name was removed. Continue taking this medication, and follow the directions you see here. Changed by: Donalee Citrin Timur Nibert   citalopram 10 MG tablet Commonly known as: CELEXA Take 1 tablet by mouth in the morning for 10 days then increase to 2 tabs in the morning What changed:  how much to take how to take this when to take this additional instructions Another medication with the same name was removed. Continue taking this medication, and follow the directions you see here.   famotidine 20  MG tablet Commonly known as: PEPCID Take 1 tablet (20 mg total) by mouth 2 (two) times daily.   magic mouthwash (nystatin, lidocaine, diphenhydrAMINE, alum & mag hydroxide) suspension 5 mLs.   pantoprazole 40 MG tablet Commonly known as: PROTONIX Take 1 tablet by mouth once daily 30 mins before breakfast What changed: Another medication with the same name was removed. Continue taking this medication, and follow the directions you see here. Changed by: Donalee Citrin Maryrose Colvin        Review of Systems  Constitutional:  Negative for appetite change, chills, fatigue, fever and unexpected weight change.  HENT:  Negative for congestion, dental problem, ear discharge, ear pain, facial swelling, hearing loss, nosebleeds, postnasal drip, rhinorrhea, sinus pressure, sinus pain, sneezing, sore throat, tinnitus and trouble swallowing.   Eyes:  Negative for pain, discharge, redness, itching and visual disturbance.  Respiratory:  Negative for cough, chest tightness, shortness of breath and wheezing.   Cardiovascular:  Negative for chest pain, palpitations and leg swelling.  Gastrointestinal:  Positive for constipation and nausea. Negative for abdominal distention, abdominal pain, blood in stool, diarrhea and vomiting.       Gas pain   Endocrine: Negative for cold intolerance, heat intolerance, polydipsia, polyphagia and polyuria.  Genitourinary:  Negative for difficulty urinating, dysuria, flank pain, frequency and urgency.  Musculoskeletal:  Negative for arthralgias, back pain, gait problem, joint swelling, myalgias, neck pain and neck stiffness.  Skin:  Negative for color change, pallor, rash and wound.  Neurological:  Negative for dizziness, syncope, speech difficulty, weakness, light-headedness, numbness and headaches.  Hematological:  Does not bruise/bleed easily.  Psychiatric/Behavioral:  Negative for agitation, behavioral problems, confusion, hallucinations, self-injury, sleep disturbance and  suicidal ideas. The patient is not nervous/anxious.     Immunization History  Administered Date(s) Administered   [employer] Vaccination 12/24/2018, 01/12/2019, 08/03/2020   Pfizer Covid-19 Vaccine Bivalent Booster 71yrs & up 10/05/2020   Pfizer(Comirnaty)Fall Seasonal Vaccine 12 years and older 09/28/2021   Zoster Recombinant(Shingrix) 01/27/2020, 03/30/2020   Pertinent  Health Maintenance Due  Topic Date Due   MAMMOGRAM  08/05/2020   INFLUENZA VACCINE  08/04/2022   Colonoscopy  01/13/2024   DEXA SCAN  Completed      03/09/2018   12:58 PM 07/07/2019    7:18 AM 06/16/2020    9:28 AM 06/16/2020    5:20 PM 09/30/2022    2:38 PM  Fall Risk  Falls in the past year?     0  (RETIRED) Patient Fall Risk Level Low fall risk Low fall risk Low fall risk Low fall risk  Functional Status Survey:    Vitals:   09/30/22 1448  BP: 120/80  Pulse: 68  Resp: 18  Temp: 97.6 F (36.4 C)  SpO2: 97%  Weight: 120 lb (54.4 kg)  Height: 5\' 4"  (1.626 m)   Body mass index is 20.6 kg/m. Physical Exam Vitals reviewed.  Constitutional:      General: She is not in acute distress.    Appearance: Normal appearance. She is normal weight. She is not ill-appearing or diaphoretic.  HENT:     Head: Normocephalic.     Right Ear: Tympanic membrane, ear canal and external ear normal. There is no impacted cerumen.     Left Ear: Tympanic membrane, ear canal and external ear normal. There is no impacted cerumen.     Nose: Nose normal. No congestion or rhinorrhea.     Mouth/Throat:     Mouth: Mucous membranes are moist.     Pharynx: Oropharynx is clear. No oropharyngeal exudate or posterior oropharyngeal erythema.  Eyes:     General: No scleral icterus.       Right eye: No discharge.        Left eye: No discharge.     Extraocular Movements: Extraocular movements intact.     Conjunctiva/sclera: Conjunctivae normal.     Pupils: Pupils are equal, round, and reactive to light.  Neck:     Vascular:  No carotid bruit.  Cardiovascular:     Rate and Rhythm: Normal rate and regular rhythm.     Pulses: Normal pulses.     Heart sounds: Normal heart sounds. No murmur heard.    No friction rub. No gallop.  Pulmonary:     Effort: Pulmonary effort is normal. No respiratory distress.     Breath sounds: Normal breath sounds. No wheezing, rhonchi or rales.  Chest:     Chest wall: No tenderness.  Abdominal:     General: Bowel sounds are normal. There is no distension.     Palpations: Abdomen is soft. There is no mass.     Tenderness: There is no abdominal tenderness. There is no right CVA tenderness, left CVA tenderness, guarding or rebound.  Musculoskeletal:        General: No swelling or tenderness. Normal range of motion.     Cervical back: Normal range of motion. No rigidity or tenderness.     Right lower leg: No edema.     Left lower leg: No edema.  Lymphadenopathy:     Cervical: No cervical adenopathy.  Skin:    General: Skin is warm and dry.     Coloration: Skin is not pale.     Findings: No bruising, erythema, lesion or rash.  Neurological:     Mental Status: She is alert and oriented to person, place, and time.     Cranial Nerves: No cranial nerve deficit.     Sensory: No sensory deficit.     Motor: No weakness.     Coordination: Coordination normal.     Gait: Gait normal.  Psychiatric:        Mood and Affect: Mood normal.        Speech: Speech normal.        Behavior: Behavior normal.        Thought Content: Thought content normal.        Judgment: Judgment normal.     Labs reviewed: No results for input(s): "NA", "K", "CL", "CO2", "GLUCOSE", "BUN", "CREATININE", "CALCIUM", "MG", "PHOS" in the last 8760 hours. No results for  input(s): "AST", "ALT", "ALKPHOS", "BILITOT", "PROT", "ALBUMIN" in the last 8760 hours. No results for input(s): "WBC", "NEUTROABS", "HGB", "HCT", "MCV", "PLT" in the last 8760 hours. No results found for: "TSH" No results found for: "HGBA1C" No  results found for: "CHOL", "HDL", "LDLCALC", "LDLDIRECT", "TRIG", "CHOLHDL"  Significant Diagnostic Results in last 30 days:  No results found.  Assessment/Plan There are no diagnoses linked to this encounter.   Family/ staff Communication: Reviewed plan of care with patient  Labs/tests ordered: None   Next Appointment :   Caesar Bookman, NP

## 2022-10-03 ENCOUNTER — Other Ambulatory Visit: Payer: Self-pay | Admitting: Family

## 2022-10-03 DIAGNOSIS — Z1231 Encounter for screening mammogram for malignant neoplasm of breast: Secondary | ICD-10-CM | POA: Diagnosis not present

## 2022-10-03 LAB — HM MAMMOGRAPHY

## 2022-10-03 NOTE — Telephone Encounter (Signed)
Requested medicaiton is not on active medication list

## 2022-10-03 NOTE — Telephone Encounter (Signed)
  Pharmacy comment: Product Backordered/Unavailable:NYTATIN ON BACKORDER.

## 2022-10-04 ENCOUNTER — Other Ambulatory Visit: Payer: Medicare Other

## 2022-10-04 ENCOUNTER — Telehealth: Payer: Self-pay

## 2022-10-04 ENCOUNTER — Other Ambulatory Visit (HOSPITAL_COMMUNITY): Payer: Self-pay

## 2022-10-04 DIAGNOSIS — K2100 Gastro-esophageal reflux disease with esophagitis, without bleeding: Secondary | ICD-10-CM

## 2022-10-04 DIAGNOSIS — E782 Mixed hyperlipidemia: Secondary | ICD-10-CM | POA: Diagnosis not present

## 2022-10-04 DIAGNOSIS — I10 Essential (primary) hypertension: Secondary | ICD-10-CM

## 2022-10-04 MED ORDER — LIDOCAINE VISCOUS HCL 2 % MT SOLN
15.0000 mL | OROMUCOSAL | 0 refills | Status: AC | PRN
  Filled 2022-10-04: qty 100, 6d supply, fill #0

## 2022-10-04 NOTE — Telephone Encounter (Signed)
Patient was in office to get labs and stated that the rx from yesterday is not covered (will cost $125), however she had an old bottle of Lidocaine 2% Viscous Solution, Take 15 mls by mouth and swish for 30 min for painful lesions and spit three times daily, prescribed in 2020  by another provider that helpled and she questions if Carilyn Goodpasture will send this in instead or something other than the nystatin.  Please advise

## 2022-10-04 NOTE — Telephone Encounter (Signed)
Lidocaine 2% viscous solution prescription send to pharmacy as requested.

## 2022-10-05 LAB — COMPLETE METABOLIC PANEL WITH GFR
AG Ratio: 1.4 (calc) (ref 1.0–2.5)
ALT: 16 U/L (ref 6–29)
AST: 25 U/L (ref 10–35)
Albumin: 3.8 g/dL (ref 3.6–5.1)
Alkaline phosphatase (APISO): 61 U/L (ref 37–153)
BUN: 7 mg/dL (ref 7–25)
CO2: 27 mmol/L (ref 20–32)
Calcium: 9.1 mg/dL (ref 8.6–10.4)
Chloride: 104 mmol/L (ref 98–110)
Creat: 0.85 mg/dL (ref 0.50–1.05)
Globulin: 2.8 g/dL (ref 1.9–3.7)
Glucose, Bld: 101 mg/dL — ABNORMAL HIGH (ref 65–99)
Potassium: 3.4 mmol/L — ABNORMAL LOW (ref 3.5–5.3)
Sodium: 139 mmol/L (ref 135–146)
Total Bilirubin: 0.5 mg/dL (ref 0.2–1.2)
Total Protein: 6.6 g/dL (ref 6.1–8.1)
eGFR: 76 mL/min/{1.73_m2} (ref >=60)

## 2022-10-05 LAB — LIPID PANEL
Cholesterol: 188 mg/dL (ref <200)
HDL: 73 mg/dL (ref >=50)
LDL Cholesterol (Calc): 96 mg/dL
Non-HDL Cholesterol (Calc): 115 mg/dL (ref <130)
Total CHOL/HDL Ratio: 2.6 (calc) (ref <5.0)
Triglycerides: 95 mg/dL (ref <150)

## 2022-10-05 LAB — CBC WITH DIFFERENTIAL/PLATELET
Absolute Monocytes: 406 {cells}/uL (ref 200–950)
Basophils Absolute: 47 {cells}/uL (ref 0–200)
Basophils Relative: 0.6 %
Eosinophils Absolute: 889 {cells}/uL — ABNORMAL HIGH (ref 15–500)
Eosinophils Relative: 11.4 %
HCT: 35.9 % (ref 35.0–45.0)
Hemoglobin: 11.4 g/dL — ABNORMAL LOW (ref 11.7–15.5)
Lymphs Abs: 4126 {cells}/uL — ABNORMAL HIGH (ref 850–3900)
MCH: 29 pg (ref 27.0–33.0)
MCHC: 31.8 g/dL — ABNORMAL LOW (ref 32.0–36.0)
MCV: 91.3 fL (ref 80.0–100.0)
MPV: 10.5 fL (ref 7.5–12.5)
Monocytes Relative: 5.2 %
Neutro Abs: 2332 {cells}/uL (ref 1500–7800)
Neutrophils Relative %: 29.9 %
Platelets: 187 10*3/uL (ref 140–400)
RBC: 3.93 10*6/uL (ref 3.80–5.10)
RDW: 12.3 % (ref 11.0–15.0)
Total Lymphocyte: 52.9 %
WBC: 7.8 10*3/uL (ref 3.8–10.8)

## 2022-10-05 LAB — TSH: TSH: 1.22 m[IU]/L (ref 0.40–4.50)

## 2022-10-06 ENCOUNTER — Telehealth: Payer: Self-pay

## 2022-10-06 NOTE — Telephone Encounter (Signed)
Lidocaine solution prescription was send to the pharmacy 10/04/2022.

## 2022-10-06 NOTE — Telephone Encounter (Signed)
Patient also states that the medication is out of stock per pharmacist that she would need anew prescription anyway

## 2022-10-06 NOTE — Telephone Encounter (Signed)
Patient states she is suppose to take Nystatin for her mouth but th medication is over 150.00 and she is unable to pay that. Patient wants to know if something else can be sent to the pharmacy

## 2022-10-06 NOTE — Telephone Encounter (Signed)
Noted  

## 2022-10-14 ENCOUNTER — Other Ambulatory Visit: Payer: Self-pay

## 2022-10-14 ENCOUNTER — Other Ambulatory Visit (HOSPITAL_COMMUNITY): Payer: Self-pay

## 2022-10-16 DIAGNOSIS — R030 Elevated blood-pressure reading, without diagnosis of hypertension: Secondary | ICD-10-CM | POA: Diagnosis not present

## 2022-10-16 DIAGNOSIS — H109 Unspecified conjunctivitis: Secondary | ICD-10-CM | POA: Diagnosis not present

## 2022-10-16 DIAGNOSIS — Z6820 Body mass index (BMI) 20.0-20.9, adult: Secondary | ICD-10-CM | POA: Diagnosis not present

## 2022-10-16 DIAGNOSIS — J018 Other acute sinusitis: Secondary | ICD-10-CM | POA: Diagnosis not present

## 2022-10-17 ENCOUNTER — Ambulatory Visit: Payer: Medicare Other | Admitting: Podiatry

## 2022-10-17 ENCOUNTER — Encounter: Payer: Self-pay | Admitting: Podiatry

## 2022-10-17 ENCOUNTER — Ambulatory Visit (INDEPENDENT_AMBULATORY_CARE_PROVIDER_SITE_OTHER): Payer: Medicare Other

## 2022-10-17 ENCOUNTER — Other Ambulatory Visit: Payer: Self-pay | Admitting: Family

## 2022-10-17 DIAGNOSIS — M779 Enthesopathy, unspecified: Secondary | ICD-10-CM

## 2022-10-17 DIAGNOSIS — M2042 Other hammer toe(s) (acquired), left foot: Secondary | ICD-10-CM | POA: Diagnosis not present

## 2022-10-17 DIAGNOSIS — E876 Hypokalemia: Secondary | ICD-10-CM

## 2022-10-17 DIAGNOSIS — L03039 Cellulitis of unspecified toe: Secondary | ICD-10-CM | POA: Diagnosis not present

## 2022-10-17 NOTE — Progress Notes (Signed)
Subjective:   Patient ID: Angela Villanueva, female   DOB: 67 y.o.   MRN: [id number]   HPI Chief Complaint  Patient presents with   Foot Pain    PATIENT STATES 2ND THAT IS SWELLING ALL THE TIME AND HURTS AND DARK AROUND THE TOP OF THE TOE. PATIENT WANTS TO FIND OUT WHAT IS CAUSING THE PAIN IN HER TOE.   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 The pain is severe enough to prevent her from standing and she has been self-treating with bandages. She denies any recent injury or changes in activities or footwear. 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 She also had a similar surgery on her arm. She has not sought any treatment for the current toe issue.   Review of Systems  All other systems reviewed and are negative.  Past Medical History:  Diagnosis Date   Allergy    Anemia    Arthritis    Diverticulitis    Gastric ulcer    GERD (gastroesophageal reflux disease)    Hepatitis    Hypertension     Past Surgical History:  Procedure Laterality Date   ABDOMINAL HYSTERECTOMY     EYE SURGERY     fibroid tumor removal     FOOT SURGERY     LAPAROSCOPIC GASTROTOMY W/ REPAIR OF ULCER     PARTIAL HYSTERECTOMY     TONSILLECTOMY     AS CHILD   TOTAL SHOULDER ARTHROPLASTY Right 06/16/2020   Procedure: RIGHT TOTAL SHOULDER ARTHROPLASTY;  Surgeon: Cammy Copa, MD;  Location: [facility] OR;  Service: Orthopedics;  Laterality: Right;   TUBAL LIGATION       Current Outpatient Medications:    celecoxib (CELEBREX) 200 MG capsule, Take 1 capsule (200 mg total) by mouth daily., Disp: 30 capsule, Rfl: 1   citalopram (CELEXA) 10 MG tablet, Take 1 tablet by mouth in the morning for 10 days then increase to 2 tabs in the morning (Patient taking  differently: Take 10 mg by mouth daily. Takes 3 tablets daily), Disp: 50 tablet, Rfl: 0   citalopram (CELEXA) 10 MG tablet, Take 1 tablet by mouth every morning for 10 days then increase to 2 tablets by mouth every  morning, Disp: 90 tablet, Rfl: 1   famotidine (PEPCID) 20 MG tablet, Take 1 tablet (20 mg total) by mouth 2 (two) times daily., Disp: 30 tablet, Rfl: 0   lidocaine (XYLOCAINE) 2 % solution, Use as directed 15 mLs in the mouth or throat as needed for mouth pain., Disp: 100 mL, Rfl: 0   nystatin (MYCOSTATIN) 100000 UNIT/ML suspension, SWISH AND SPIT 5 MLS 3 (THREE) TIMES DAILY., Disp: 180 mL, Rfl: 3   pantoprazole (PROTONIX) 40 MG tablet, Take 1 tablet by mouth once daily 30 mins before breakfast, Disp: 90 tablet, Rfl: 1   Probiotic Product (RESTORA) CAPS, Take 1 capsule by mouth daily., Disp: , Rfl:   Allergies  Allergen Reactions   Ampicillin Anaphylaxis and Other (See Comments)    Has patient had a PCN reaction causing immediate rash, facial/tongue/throat swelling, SOB or lightheadedness with hypotension: yes Has patient had a PCN reaction causing severe rash involving mucus membranes or skin necrosis: no Has patient had a PCN reaction that required hospitalization  no Has patient had a PCN reaction occurring within the last 10 years: no If all of the above answers are "NO", then may proceed with Cephalosporin use.    Penicillins Hives, Rash and Other (See Comments)    Has patient had a PCN reaction causing immediate rash, facial/tongue/throat swelling, SOB or lightheadedness with hypotension: yes Has patient had a PCN reaction causing severe rash involving mucus membranes or skin necrosis: no Has patient had a PCN reaction that required hospitalization yes - reaction sent me to hospital Has patient had a PCN reaction occurring within the last 10 years: no If all of the above answers are "NO", then may proceed with Cephalosporin use.           Objective:  Physical Exam   General: AAO x3, NAD  Dermatological: Skin is warm, dry and supple bilateral there is mild edema noted along the second digit mostly on the PIPJ.  There is no erythema or warmth.  There are no open lesions noted.   Vascular: Dorsalis Pedis artery and Posterior Tibial artery pedal pulses are palpable bilateral with immedate capillary fill time.  There is no pain with calf compression, swelling, warmth, erythema.   Neruologic: Grossly intact via light touch bilateral.   Musculoskeletal: Mild hammertoe contracture noted of the second digit.  Tenderness is localized along the second PIPJ there is some localized edema there is no erythema or warmth.  There is no other areas of point tenderness.  Gait: Unassisted, Nonantalgic.       Assessment:   Toe pain, capsulitis toe/hammertoe     Plan:  Left Second Toe Pain and Swelling Pain and swelling for one month. No recent injury. Noted longer second toe and history of toe replacement surgery. Possible arthritis in the joint. -Order blood work to check for possible causes. -Provide patient with three different toe crest options to alleviate pressure and pain. She states this felt good on the way out.  -Supportive shoe gear, offloading to the toes.  -Consider further intervention if non-surgical options are ineffective but I would use this as a last resort.   Radiology: X-rays obtained reviewed.  3 views of the foot were obtained.  No evidence of acute fracture.     Vivi Barrack DPM

## 2022-10-18 LAB — CBC WITH DIFFERENTIAL/PLATELET
Basophils Absolute: 0 10*3/uL (ref 0.0–0.2)
Basos: 0 %
EOS (ABSOLUTE): 0.1 10*3/uL (ref 0.0–0.4)
Eos: 1 %
Hematocrit: 36.9 % (ref 34.0–46.6)
Hemoglobin: 11.8 g/dL (ref 11.1–15.9)
Immature Grans (Abs): 0 10*3/uL (ref 0.0–0.1)
Immature Granulocytes: 0 %
Lymphocytes Absolute: 4 10*3/uL — ABNORMAL HIGH (ref 0.7–3.1)
Lymphs: 37 %
MCH: 29.4 pg (ref 26.6–33.0)
MCHC: 32 g/dL (ref 31.5–35.7)
MCV: 92 fL (ref 79–97)
Monocytes Absolute: 0.8 10*3/uL (ref 0.1–0.9)
Monocytes: 7 %
Neutrophils Absolute: 6.1 10*3/uL (ref 1.4–7.0)
Neutrophils: 55 %
Platelets: 203 10*3/uL (ref 150–450)
RBC: 4.02 x10E6/uL (ref 3.77–5.28)
RDW: 12.4 % (ref 11.7–15.4)
WBC: 11 10*3/uL — ABNORMAL HIGH (ref 3.4–10.8)

## 2022-10-18 LAB — URIC ACID: Uric Acid: 2.9 mg/dL — ABNORMAL LOW (ref 3.0–7.2)

## 2022-10-18 LAB — C-REACTIVE PROTEIN: CRP: <1 mg/L (ref 0–10)

## 2022-10-18 LAB — SEDIMENTATION RATE: Sed Rate: 2 mm/h (ref 0–40)

## 2022-10-19 ENCOUNTER — Other Ambulatory Visit: Payer: Self-pay

## 2022-10-25 ENCOUNTER — Telehealth: Payer: Self-pay | Admitting: Podiatry

## 2022-10-25 ENCOUNTER — Other Ambulatory Visit: Payer: Medicare Other

## 2022-10-25 DIAGNOSIS — E876 Hypokalemia: Secondary | ICD-10-CM | POA: Diagnosis not present

## 2022-10-25 NOTE — Telephone Encounter (Signed)
Pt called and was returning the call from Dr Ardelle Anton about her test results. She also asked about the things you gave her to go over the toes she needs the one that completely goes over the toe.

## 2022-10-26 LAB — BASIC METABOLIC PANEL
BUN: 8 mg/dL (ref 7–25)
CO2: 25 mmol/L (ref 20–32)
Calcium: 9.1 mg/dL (ref 8.6–10.4)
Chloride: 106 mmol/L (ref 98–110)
Creat: 0.82 mg/dL (ref 0.50–1.05)
Glucose, Bld: 96 mg/dL (ref 65–99)
Potassium: 2.9 mmol/L — ABNORMAL LOW (ref 3.5–5.3)
Sodium: 138 mmol/L (ref 135–146)

## 2022-10-27 ENCOUNTER — Other Ambulatory Visit: Payer: Self-pay

## 2022-10-27 DIAGNOSIS — E876 Hypokalemia: Secondary | ICD-10-CM

## 2022-10-27 MED ORDER — POTASSIUM CHLORIDE CRYS ER 20 MEQ PO TBCR
20.0000 meq | EXTENDED_RELEASE_TABLET | Freq: Every day | ORAL | 1 refills | Status: DC
Start: 2022-10-27 — End: 2022-11-21

## 2022-11-10 ENCOUNTER — Other Ambulatory Visit: Payer: Medicare Other

## 2022-11-10 ENCOUNTER — Other Ambulatory Visit: Payer: Self-pay

## 2022-11-10 DIAGNOSIS — E876 Hypokalemia: Secondary | ICD-10-CM

## 2022-11-11 LAB — BASIC METABOLIC PANEL
BUN: 9 mg/dL (ref 7–25)
CO2: 25 mmol/L (ref 20–32)
Calcium: 9.2 mg/dL (ref 8.6–10.4)
Chloride: 106 mmol/L (ref 98–110)
Creat: 0.83 mg/dL (ref 0.50–1.05)
Glucose, Bld: 90 mg/dL (ref 65–99)
Potassium: 4.8 mmol/L (ref 3.5–5.3)
Sodium: 139 mmol/L (ref 135–146)

## 2022-11-15 DIAGNOSIS — H02886 Meibomian gland dysfunction of left eye, unspecified eyelid: Secondary | ICD-10-CM | POA: Diagnosis not present

## 2022-11-15 DIAGNOSIS — H02883 Meibomian gland dysfunction of right eye, unspecified eyelid: Secondary | ICD-10-CM | POA: Diagnosis not present

## 2022-11-15 DIAGNOSIS — H16213 Exposure keratoconjunctivitis, bilateral: Secondary | ICD-10-CM | POA: Diagnosis not present

## 2022-11-20 ENCOUNTER — Other Ambulatory Visit: Payer: Self-pay | Admitting: Family

## 2022-11-20 DIAGNOSIS — E876 Hypokalemia: Secondary | ICD-10-CM

## 2022-11-21 NOTE — Telephone Encounter (Signed)
Pharmacy comment:  REQUEST FOR 90 DAYS PRESCRIPTION. DX Code Needed    

## 2022-11-28 ENCOUNTER — Ambulatory Visit (INDEPENDENT_AMBULATORY_CARE_PROVIDER_SITE_OTHER): Payer: Medicare Other | Admitting: Podiatry

## 2022-11-28 DIAGNOSIS — M779 Enthesopathy, unspecified: Secondary | ICD-10-CM

## 2022-11-28 NOTE — Patient Instructions (Signed)
The company is called "[employer] that we get the toe caps from.

## 2022-12-03 ENCOUNTER — Other Ambulatory Visit: Payer: Self-pay | Admitting: Family

## 2022-12-05 NOTE — Progress Notes (Signed)
Subjective: Chief Complaint  Patient presents with   Toe Pain    2ND Toe, still the same, wants to talk about next steps, has been wearing the toe cap like you instructed    67 year old female presents the office today for follow-up evaluation of left second toe pain.  She states that when she wears The Toe, offloading.  She has no pain and she has been doing well.  She does not want to have any surgery if she can avoid it.  She has no recent injuries or changes otherwise since I saw her last.  Objective: AAO x3, NAD DP/PT pulses palpable bilaterally, CRT less than 3 seconds Unable to identify any area of tenderness to left second toe.  There is no edema, erythema.  There is no open lesions.  There is no other areas of discomfort identified. No pain with calf compression, swelling, warmth, erythema  Assessment: Left second toe pain, capsulitis - improving  Plan: -All treatment options discussed with the patient including all alternatives, risks, complications.  -We discussed the conservative as well as surgical options.  She is doing well with the offloading pads which I dispensed another one today for her. She would like to continue with this treatment for now. Discussed shoes to avoid pressure.  -Patient encouraged to call the office with any questions, concerns, change in symptoms.   Vivi Barrack DPM

## 2022-12-19 DIAGNOSIS — H02883 Meibomian gland dysfunction of right eye, unspecified eyelid: Secondary | ICD-10-CM | POA: Diagnosis not present

## 2022-12-19 DIAGNOSIS — H02886 Meibomian gland dysfunction of left eye, unspecified eyelid: Secondary | ICD-10-CM | POA: Diagnosis not present

## 2022-12-19 DIAGNOSIS — H43811 Vitreous degeneration, right eye: Secondary | ICD-10-CM | POA: Diagnosis not present

## 2022-12-19 DIAGNOSIS — H16213 Exposure keratoconjunctivitis, bilateral: Secondary | ICD-10-CM | POA: Diagnosis not present

## 2023-01-07 ENCOUNTER — Other Ambulatory Visit: Payer: Self-pay | Admitting: Family

## 2023-01-07 DIAGNOSIS — K2100 Gastro-esophageal reflux disease with esophagitis, without bleeding: Secondary | ICD-10-CM

## 2023-01-09 NOTE — Telephone Encounter (Signed)
 Patient still has 1 refill on medication. Medication pend and sent to PCP [place], Donalee Citrin, NP

## 2023-01-18 DIAGNOSIS — H02883 Meibomian gland dysfunction of right eye, unspecified eyelid: Secondary | ICD-10-CM | POA: Diagnosis not present

## 2023-01-18 DIAGNOSIS — H43811 Vitreous degeneration, right eye: Secondary | ICD-10-CM | POA: Diagnosis not present

## 2023-01-18 DIAGNOSIS — H16223 Keratoconjunctivitis sicca, not specified as Sjogren's, bilateral: Secondary | ICD-10-CM | POA: Diagnosis not present

## 2023-01-18 DIAGNOSIS — H02886 Meibomian gland dysfunction of left eye, unspecified eyelid: Secondary | ICD-10-CM | POA: Diagnosis not present

## 2023-01-18 DIAGNOSIS — H16213 Exposure keratoconjunctivitis, bilateral: Secondary | ICD-10-CM | POA: Diagnosis not present

## 2023-02-05 ENCOUNTER — Other Ambulatory Visit: Payer: Self-pay | Admitting: Family

## 2023-02-15 ENCOUNTER — Encounter: Payer: Medicare Other | Admitting: Family

## 2023-02-15 NOTE — Progress Notes (Signed)
This encounter was created in error - please disregard. No show

## 2023-02-22 ENCOUNTER — Other Ambulatory Visit: Payer: Self-pay | Admitting: Family

## 2023-02-22 NOTE — Telephone Encounter (Signed)
Pharmacy requested refill.  Pended Rx and sent to [facility] for approval due to High Alert Warning.

## 2023-03-10 DIAGNOSIS — Z79899 Other long term (current) drug therapy: Secondary | ICD-10-CM | POA: Diagnosis not present

## 2023-03-10 DIAGNOSIS — K5900 Constipation, unspecified: Secondary | ICD-10-CM | POA: Diagnosis not present

## 2023-03-10 DIAGNOSIS — Z136 Encounter for screening for cardiovascular disorders: Secondary | ICD-10-CM | POA: Diagnosis not present

## 2023-03-10 DIAGNOSIS — Z0189 Encounter for other specified special examinations: Secondary | ICD-10-CM | POA: Diagnosis not present

## 2023-03-10 DIAGNOSIS — Z1159 Encounter for screening for other viral diseases: Secondary | ICD-10-CM | POA: Diagnosis not present

## 2023-03-10 DIAGNOSIS — K219 Gastro-esophageal reflux disease without esophagitis: Secondary | ICD-10-CM | POA: Diagnosis not present

## 2023-03-10 DIAGNOSIS — R141 Gas pain: Secondary | ICD-10-CM | POA: Diagnosis not present

## 2023-03-10 DIAGNOSIS — R143 Flatulence: Secondary | ICD-10-CM | POA: Diagnosis not present

## 2023-03-10 DIAGNOSIS — Z0000 Encounter for general adult medical examination without abnormal findings: Secondary | ICD-10-CM | POA: Diagnosis not present

## 2023-03-10 DIAGNOSIS — E559 Vitamin D deficiency, unspecified: Secondary | ICD-10-CM | POA: Diagnosis not present

## 2023-03-10 DIAGNOSIS — R142 Eructation: Secondary | ICD-10-CM | POA: Diagnosis not present

## 2023-03-13 DIAGNOSIS — H02883 Meibomian gland dysfunction of right eye, unspecified eyelid: Secondary | ICD-10-CM | POA: Diagnosis not present

## 2023-03-13 DIAGNOSIS — H02886 Meibomian gland dysfunction of left eye, unspecified eyelid: Secondary | ICD-10-CM | POA: Diagnosis not present

## 2023-03-13 DIAGNOSIS — H16213 Exposure keratoconjunctivitis, bilateral: Secondary | ICD-10-CM | POA: Diagnosis not present

## 2023-03-13 DIAGNOSIS — H43811 Vitreous degeneration, right eye: Secondary | ICD-10-CM | POA: Diagnosis not present

## 2023-03-21 DIAGNOSIS — H02886 Meibomian gland dysfunction of left eye, unspecified eyelid: Secondary | ICD-10-CM | POA: Diagnosis not present

## 2023-03-21 DIAGNOSIS — H43811 Vitreous degeneration, right eye: Secondary | ICD-10-CM | POA: Diagnosis not present

## 2023-03-21 DIAGNOSIS — H02883 Meibomian gland dysfunction of right eye, unspecified eyelid: Secondary | ICD-10-CM | POA: Diagnosis not present

## 2023-03-21 DIAGNOSIS — H16213 Exposure keratoconjunctivitis, bilateral: Secondary | ICD-10-CM | POA: Diagnosis not present

## 2023-03-27 DIAGNOSIS — K5900 Constipation, unspecified: Secondary | ICD-10-CM | POA: Diagnosis not present

## 2023-03-27 DIAGNOSIS — E785 Hyperlipidemia, unspecified: Secondary | ICD-10-CM | POA: Diagnosis not present

## 2023-03-27 DIAGNOSIS — H18413 Arcus senilis, bilateral: Secondary | ICD-10-CM | POA: Diagnosis not present

## 2023-03-27 DIAGNOSIS — Z7189 Other specified counseling: Secondary | ICD-10-CM | POA: Diagnosis not present

## 2023-03-27 DIAGNOSIS — Z0001 Encounter for general adult medical examination with abnormal findings: Secondary | ICD-10-CM | POA: Diagnosis not present

## 2023-03-27 DIAGNOSIS — G3184 Mild cognitive impairment, so stated: Secondary | ICD-10-CM | POA: Diagnosis not present

## 2023-03-27 DIAGNOSIS — R7989 Other specified abnormal findings of blood chemistry: Secondary | ICD-10-CM | POA: Diagnosis not present

## 2023-03-27 DIAGNOSIS — K219 Gastro-esophageal reflux disease without esophagitis: Secondary | ICD-10-CM | POA: Diagnosis not present

## 2023-03-31 ENCOUNTER — Ambulatory Visit: Payer: Medicare Other | Admitting: Family

## 2023-04-02 ENCOUNTER — Other Ambulatory Visit: Payer: Self-pay | Admitting: Family

## 2023-04-03 DIAGNOSIS — H16213 Exposure keratoconjunctivitis, bilateral: Secondary | ICD-10-CM | POA: Diagnosis not present

## 2023-04-03 DIAGNOSIS — H353131 Nonexudative age-related macular degeneration, bilateral, early dry stage: Secondary | ICD-10-CM | POA: Diagnosis not present

## 2023-04-03 NOTE — Telephone Encounter (Signed)
 Pharmacy requested refill.  Last appointment 09/30/2022 No upcoming appointment.  Note added for patient to schedule an appointment before anymore future refills.

## 2023-04-30 ENCOUNTER — Other Ambulatory Visit: Payer: Self-pay | Admitting: Family

## 2023-05-02 DIAGNOSIS — R142 Eructation: Secondary | ICD-10-CM | POA: Diagnosis not present

## 2023-05-02 DIAGNOSIS — K219 Gastro-esophageal reflux disease without esophagitis: Secondary | ICD-10-CM | POA: Diagnosis not present

## 2023-05-02 NOTE — Telephone Encounter (Signed)
 Patient is requesting more Celebrex . Patient last given 30 tablets with no refills. Medication pend and sent to PCP Ngetich, Dinah C, NP

## 2023-05-08 DIAGNOSIS — H02886 Meibomian gland dysfunction of left eye, unspecified eyelid: Secondary | ICD-10-CM | POA: Diagnosis not present

## 2023-05-08 DIAGNOSIS — H26491 Other secondary cataract, right eye: Secondary | ICD-10-CM | POA: Diagnosis not present

## 2023-05-08 DIAGNOSIS — H02883 Meibomian gland dysfunction of right eye, unspecified eyelid: Secondary | ICD-10-CM | POA: Diagnosis not present

## 2023-05-08 DIAGNOSIS — H16213 Exposure keratoconjunctivitis, bilateral: Secondary | ICD-10-CM | POA: Diagnosis not present

## 2023-05-08 DIAGNOSIS — H353131 Nonexudative age-related macular degeneration, bilateral, early dry stage: Secondary | ICD-10-CM | POA: Diagnosis not present

## 2023-05-08 DIAGNOSIS — H16223 Keratoconjunctivitis sicca, not specified as Sjogren's, bilateral: Secondary | ICD-10-CM | POA: Diagnosis not present

## 2023-05-08 DIAGNOSIS — Z961 Presence of intraocular lens: Secondary | ICD-10-CM | POA: Diagnosis not present

## 2023-05-18 ENCOUNTER — Other Ambulatory Visit: Payer: Self-pay | Admitting: Family

## 2023-05-18 DIAGNOSIS — E876 Hypokalemia: Secondary | ICD-10-CM

## 2023-05-22 NOTE — Telephone Encounter (Signed)
 Patient has been advised

## 2023-05-24 DIAGNOSIS — H26491 Other secondary cataract, right eye: Secondary | ICD-10-CM | POA: Diagnosis not present

## 2023-05-24 DIAGNOSIS — H02886 Meibomian gland dysfunction of left eye, unspecified eyelid: Secondary | ICD-10-CM | POA: Diagnosis not present

## 2023-05-24 DIAGNOSIS — Z961 Presence of intraocular lens: Secondary | ICD-10-CM | POA: Diagnosis not present

## 2023-06-09 ENCOUNTER — Other Ambulatory Visit: Payer: Self-pay | Admitting: Family

## 2023-06-09 DIAGNOSIS — E876 Hypokalemia: Secondary | ICD-10-CM

## 2023-06-27 ENCOUNTER — Telehealth

## 2023-06-27 NOTE — Telephone Encounter (Signed)
 Patient states that she's no longer seeing Roxan Plough, NP as PCP. She is now a patient at [facility]. I have removed Roxan Plough, NP and PCP.

## 2023-07-08 ENCOUNTER — Other Ambulatory Visit: Payer: Self-pay | Admitting: Family

## 2023-07-08 DIAGNOSIS — E876 Hypokalemia: Secondary | ICD-10-CM

## 2023-07-15 ENCOUNTER — Other Ambulatory Visit: Payer: Self-pay | Admitting: Family

## 2023-07-15 DIAGNOSIS — K2100 Gastro-esophageal reflux disease with esophagitis, without bleeding: Secondary | ICD-10-CM

## 2023-07-24 ENCOUNTER — Other Ambulatory Visit (HOSPITAL_COMMUNITY): Payer: Self-pay

## 2023-07-24 MED ORDER — METOCLOPRAMIDE HCL 5 MG PO TABS
5.0000 mg | ORAL_TABLET | Freq: Three times a day (TID) | ORAL | 2 refills | Status: AC
  Filled 2023-07-24: qty 90, 30d supply, fill #0

## 2023-08-02 ENCOUNTER — Other Ambulatory Visit (HOSPITAL_COMMUNITY): Payer: Self-pay

## 2023-08-17 ENCOUNTER — Telehealth: Payer: Self-pay

## 2023-08-17 NOTE — Telephone Encounter (Signed)
 Pt called stating that she had a missed phone call from us  and was calling us  back. I was unable to find any documentation showing that she got a call from us . I let her know that I would leave a note in her chart incase any tried to call her again.

## 2023-08-21 ENCOUNTER — Other Ambulatory Visit (HOSPITAL_COMMUNITY): Payer: Self-pay

## 2023-08-21 MED ORDER — PANTOPRAZOLE SODIUM 40 MG PO TBEC
40.0000 mg | DELAYED_RELEASE_TABLET | Freq: Two times a day (BID) | ORAL | 5 refills | Status: AC
  Filled 2023-08-21: qty 60, 30d supply, fill #0

## 2023-08-21 MED ORDER — DICYCLOMINE HCL 20 MG PO TABS
20.0000 mg | ORAL_TABLET | Freq: Three times a day (TID) | ORAL | 5 refills | Status: AC
  Filled 2023-08-21: qty 90, 30d supply, fill #0
  Filled 2023-10-09: qty 90, 30d supply, fill #1

## 2023-08-26 ENCOUNTER — Other Ambulatory Visit: Payer: Self-pay | Admitting: Family

## 2023-08-30 ENCOUNTER — Other Ambulatory Visit: Payer: Self-pay | Admitting: Family

## 2023-09-06 ENCOUNTER — Other Ambulatory Visit (INDEPENDENT_AMBULATORY_CARE_PROVIDER_SITE_OTHER): Payer: Self-pay

## 2023-09-06 ENCOUNTER — Encounter: Payer: Self-pay | Admitting: Orthopedic Surgery

## 2023-09-06 ENCOUNTER — Ambulatory Visit (INDEPENDENT_AMBULATORY_CARE_PROVIDER_SITE_OTHER): Admitting: Orthopedic Surgery

## 2023-09-06 DIAGNOSIS — M25561 Pain in right knee: Secondary | ICD-10-CM

## 2023-09-06 DIAGNOSIS — M1711 Unilateral primary osteoarthritis, right knee: Secondary | ICD-10-CM

## 2023-09-10 ENCOUNTER — Encounter: Payer: Self-pay | Admitting: Orthopedic Surgery

## 2023-09-10 DIAGNOSIS — M1711 Unilateral primary osteoarthritis, right knee: Secondary | ICD-10-CM | POA: Diagnosis not present

## 2023-09-10 MED ORDER — LIDOCAINE HCL 1 % IJ SOLN
5.0000 mL | INTRAMUSCULAR | Status: AC | PRN
  Administered 2023-09-10: 5 mL

## 2023-09-10 MED ORDER — BUPIVACAINE HCL 0.25 % IJ SOLN
4.0000 mL | INTRAMUSCULAR | Status: AC | PRN
  Administered 2023-09-10: 4 mL via INTRA_ARTICULAR

## 2023-09-10 MED ORDER — TRIAMCINOLONE ACETONIDE 40 MG/ML IJ SUSP
40.0000 mg | INTRAMUSCULAR | Status: AC | PRN
  Administered 2023-09-10: 40 mg via INTRA_ARTICULAR

## 2023-09-10 NOTE — Progress Notes (Addendum)
 Office Visit Note   Patient: Angela Villanueva           Date of Birth: [birth year]           MRN: [id number] Visit Date: 09/06/2023 Requested by: Arloa Jarvis, NP [address] [place],  [place] [address] PCP: No primary care provider on file.  Subjective: Chief Complaint  Patient presents with   Right Knee - Pain    HPI: Angela Villanueva is a 68 y.o. female who presents to the office reporting right knee pain.  Patient's states that she feels bone-on-bone type articulation with ambulation.  Hurts her to walk.  Likes to do water  type exercises in the morning.  Pain does not wake her from sleep.  Is progressively getting worse.  Overall is not to the level yet where she needs knee replacement.  Her shoulder has improved since last clinic visit..                ROS: All systems reviewed are negative as they relate to the chief complaint within the history of present illness.  Patient denies fevers or chills.  Assessment & Plan: Visit Diagnoses:  1. Right knee pain, unspecified chronicity     Plan: Impression is moderate arthritis in the right knee.  She has good range of motion and no effusion.  Cortisone injection performed today.  We will preapproved for for gel shot and she will come back for that when this cortisone shot starts to wear off.  Follow-Up Instructions: No follow-ups on file.   Orders:  Orders Placed This Encounter  Procedures   XR KNEE 3 VIEW RIGHT   Ambulatory request for injection medication   No orders of the defined types were placed in this encounter.     Procedures: Large Joint Inj: R knee on 09/10/2023 8:38 AM Indications: diagnostic evaluation, joint swelling and pain Details: 18 G 1.5 in needle, superolateral approach  Arthrogram: No  Medications: 5 mL lidocaine  1 %; 4 mL bupivacaine  0.25 %; 40 mg triamcinolone  acetonide 40 MG/ML Outcome: tolerated well, no immediate complications Procedure, treatment alternatives, risks and  benefits explained, specific risks discussed. Consent was given by the patient. Immediately prior to procedure a time out was called to verify the correct patient, procedure, equipment, support staff and site/side marked as required. Patient was prepped and draped in the usual sterile fashion.     This patient is diagnosed with osteoarthritis of the knee(s).    Radiographs show evidence of joint space narrowing, osteophytes, subchondral sclerosis and/or subchondral cysts.  This patient has knee pain which interferes with functional and activities of daily living.    This patient has experienced inadequate response, adverse effects and/or intolerance with conservative treatments such as acetaminophen , NSAIDS, topical creams, physical therapy or regular exercise, knee bracing and/or weight loss.   This patient has experienced inadequate response or has a contraindication to intra articular steroid injections for at least 3 months.   This patient is not scheduled to have a total knee replacement within 6 months of starting treatment with viscosupplementation.   Clinical Data: No additional findings.  Objective: Vital Signs: There were no vitals taken for this visit.  Physical Exam:  Constitutional: Patient appears well-developed HEENT:  Head: Normocephalic Eyes:EOM are normal Neck: Normal range of motion Cardiovascular: Normal rate Pulmonary/chest: Effort normal Neurologic: Patient is alert Skin: Skin is warm Psychiatric: Patient has normal mood and affect  Ortho Exam: Ortho exam demonstrates full range of motion  of the right knee.  No effusion.  Collateral crucial ligaments are stable.  Extensor mechanism intact and nontender.  No masses lymphadenopathy or skin changes noted around the right knee region.  Mild patellofemoral crepitus is present.  Has both medial and lateral joint line tenderness.  Pedal pulses palpable.  Ankle dorsiflexion intact.  Specialty Comments:  No specialty  comments available.  Imaging: No results found.   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   Gastroparesis 09/01/2015   Abdominal pain, epigastric 01/22/2014   Esophageal reflux 05/02/2012   Dysphagia 05/02/2012   DUODENITIS, WITHOUT HEMORRHAGE 04/09/2007   Constipation 04/09/2007   History of peptic ulcer disease 04/09/2007   Past Medical History:  Diagnosis Date   Allergy    Anemia    Arthritis    Diverticulitis    Gastric ulcer    GERD (gastroesophageal reflux disease)    Hepatitis    Hypertension     Family History  Problem Relation Age of Onset   COPD Mother    Cancer Mother    Other Mother        blood cloth   Diabetes Other     Past Surgical History:  Procedure Laterality Date   ABDOMINAL HYSTERECTOMY     EYE SURGERY     fibroid tumor removal     FOOT SURGERY     LAPAROSCOPIC GASTROTOMY W/ REPAIR OF ULCER     PARTIAL HYSTERECTOMY     TONSILLECTOMY     AS CHILD   TOTAL SHOULDER ARTHROPLASTY Right 06/16/2020   Procedure: RIGHT TOTAL SHOULDER ARTHROPLASTY;  Surgeon: Addie Cordella Hamilton, MD;  Location: [facility] OR;  Service: Orthopedics;  Laterality: Right;   TUBAL LIGATION     Social History   Occupational History   Occupation: Custodian    Employer: [employer]  Tobacco Use   Smoking status: Former    Current packs/day: 0.00    Types: Cigarettes    Quit date: 01/04/1992    Years since quitting: 31.7   Smokeless tobacco: Never  Vaping Use   Vaping status: Never Used  Substance and Sexual Activity   Alcohol use: No   Drug use: No   Sexual activity: Not Currently

## 2023-09-14 ENCOUNTER — Other Ambulatory Visit: Payer: Self-pay | Admitting: Family

## 2023-09-14 NOTE — Telephone Encounter (Signed)
 Dillard, Jasmine E, [place]    06/27/23  8:51 AM Note Patient states that she's no longer seeing Roxan Plough, NP as PCP. She is now a patient at [facility]. I have removed Roxan Plough, NP and PCP.

## 2023-09-25 ENCOUNTER — Encounter: Payer: Self-pay | Admitting: Orthopedic Surgery

## 2023-09-25 ENCOUNTER — Ambulatory Visit: Admitting: Orthopedic Surgery

## 2023-09-25 DIAGNOSIS — M1711 Unilateral primary osteoarthritis, right knee: Secondary | ICD-10-CM

## 2023-09-25 MED ORDER — LIDOCAINE HCL 1 % IJ SOLN
5.0000 mL | INTRAMUSCULAR | Status: AC | PRN
  Administered 2023-09-25: 5 mL

## 2023-09-25 MED ORDER — SODIUM HYALURONATE 60 MG/3ML IX PRSY
60.0000 mg | PREFILLED_SYRINGE | INTRA_ARTICULAR | Status: AC | PRN
  Administered 2023-09-25: 60 mg via INTRA_ARTICULAR

## 2023-09-25 NOTE — Progress Notes (Signed)
   Procedure Note  Patient: Angela Villanueva             Date of Birth: [birth year]           MRN: [id number]             Visit Date: 09/25/2023  Procedures: Visit Diagnoses:  1. Arthritis of right knee     Large Joint Inj: R knee on 09/25/2023 3:19 PM Indications: diagnostic evaluation, joint swelling and pain Details: 18 G 1.5 in needle, superolateral approach  Arthrogram: No  Medications: 5 mL lidocaine  1 %; 60 mg Sodium Hyaluronate 60 MG/3ML Outcome: tolerated well, no immediate complications Procedure, treatment alternatives, risks and benefits explained, specific risks discussed. Consent was given by the patient. Immediately prior to procedure a time out was called to verify the correct patient, procedure, equipment, support staff and site/side marked as required. Patient was prepped and draped in the usual sterile fashion.     Lot #[id number]

## 2023-10-10 ENCOUNTER — Other Ambulatory Visit (HOSPITAL_COMMUNITY): Payer: Self-pay

## 2023-11-06 ENCOUNTER — Other Ambulatory Visit: Payer: Self-pay

## 2023-11-06 ENCOUNTER — Emergency Department (HOSPITAL_COMMUNITY)
Admission: EM | Admit: 2023-11-06 | Discharge: 2023-11-07 | Disposition: A | Discharge status: Home / Self Care | Attending: Emergency Medicine | Admitting: Emergency Medicine

## 2023-11-06 ENCOUNTER — Emergency Department (HOSPITAL_COMMUNITY)

## 2023-11-06 DIAGNOSIS — R1084 Generalized abdominal pain: Secondary | ICD-10-CM | POA: Diagnosis present

## 2023-11-06 DIAGNOSIS — Z87891 Personal history of nicotine dependence: Secondary | ICD-10-CM | POA: Diagnosis not present

## 2023-11-06 DIAGNOSIS — I10 Essential (primary) hypertension: Secondary | ICD-10-CM | POA: Insufficient documentation

## 2023-11-06 LAB — COMPREHENSIVE METABOLIC PANEL WITH GFR
ALT: 19 U/L (ref 0–44)
AST: 32 U/L (ref 15–41)
Albumin: 4.3 g/dL (ref 3.5–5.0)
Alkaline Phosphatase: 65 U/L (ref 38–126)
Anion gap: 10 (ref 5–15)
BUN: 16 mg/dL (ref 8–23)
CO2: 19 mmol/L — ABNORMAL LOW (ref 22–32)
Calcium: 9.5 mg/dL (ref 8.9–10.3)
Chloride: 105 mmol/L (ref 98–111)
Creatinine, Ser: 1.01 mg/dL — ABNORMAL HIGH (ref 0.44–1.00)
GFR, Estimated: >60 mL/min (ref >60)
Glucose, Bld: 99 mg/dL (ref 70–99)
Potassium: 3.8 mmol/L (ref 3.5–5.1)
Sodium: 133 mmol/L — ABNORMAL LOW (ref 135–145)
Total Bilirubin: 0.6 mg/dL (ref 0.0–1.2)
Total Protein: 7.5 g/dL (ref 6.5–8.1)

## 2023-11-06 LAB — CBC
HCT: 40.6 % (ref 36.0–46.0)
Hemoglobin: 13.3 g/dL (ref 12.0–15.0)
MCH: 30 pg (ref 26.0–34.0)
MCHC: 32.8 g/dL (ref 30.0–36.0)
MCV: 91.4 fL (ref 80.0–100.0)
Platelets: 173 K/uL (ref 150–400)
RBC: 4.44 MIL/uL (ref 3.87–5.11)
RDW: 12 % (ref 11.5–15.5)
WBC: 8.8 K/uL (ref 4.0–10.5)
nRBC: 0 % (ref 0.0–0.2)

## 2023-11-06 LAB — LIPASE, BLOOD: Lipase: 71 U/L — ABNORMAL HIGH (ref 11–51)

## 2023-11-06 MED ORDER — IOHEXOL 300 MG/ML  SOLN
100.0000 mL | Freq: Once | INTRAMUSCULAR | Status: AC | PRN
  Administered 2023-11-06: 100 mL via INTRAVENOUS

## 2023-11-06 MED ORDER — HYDROMORPHONE HCL 1 MG/ML IJ SOLN
0.5000 mg | Freq: Once | INTRAMUSCULAR | Status: AC
  Administered 2023-11-06: 0.5 mg via INTRAVENOUS
  Filled 2023-11-06: qty 1

## 2023-11-06 MED ORDER — ONDANSETRON HCL 4 MG/2ML IJ SOLN
4.0000 mg | Freq: Once | INTRAMUSCULAR | Status: AC
  Administered 2023-11-06: 4 mg via INTRAVENOUS
  Filled 2023-11-06: qty 2

## 2023-11-06 NOTE — ED Notes (Signed)
 Pt unable to leave urine sample at this time, pt complaining of a lot of abdominal pain

## 2023-11-06 NOTE — ED Triage Notes (Signed)
 Patient c/o generalized abdominal pain x 3 days. Patient report nausea and vomiting x5 today. Patient report constipation for 4 days. Patient denies dysuria. Patient denies SOB and CP.

## 2023-11-06 NOTE — ED Provider Notes (Signed)
 [place] EMERGENCY DEPARTMENT AT [facility] Provider Note  CSN: [id number] Arrival date & time: 11/06/23 1651  Chief Complaint(s) Abdominal Pain  HPI Angela Villanueva is a 68 y.o. female history of diverticulitis, gastroparesis, hypertension presenting to the emergency department with abdominal pain.  Patient reports abdominal pain worse in the upper abdomen.  Reports associated nausea and vomiting denies any hematemesis.  Reports associated constipation, still passes gas from time to time.  Denies any fevers or chills.  Denies any urinary symptoms.  Denies any back pain, cough, chest pain, shortness of breath.  No diarrhea.   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 Authorizing Provider  celecoxib  (CELEBREX ) 200 MG capsule TAKE 1 CAPSULE (200 MG TOTAL) BY MOUTH DAILY. NEEDS AN APPOINTMENT BEFORE ANYMORE FUTURE REFILLS. 05/02/23   Ngetich, Dinah C, NP  citalopram  (CELEXA ) 10 MG tablet Take 1 tablet by mouth in the morning for 10 days then increase to 2 tabs in the morning Patient taking differently: Take 10 mg by mouth daily. Takes 3 tablets daily 03/07/22   Bland, Veita, MD  citalopram  (CELEXA ) 10 MG tablet Take 2 tablets (20 mg total) by mouth daily. 02/22/23   Ngetich, Dinah C, NP  dicyclomine  (BENTYL ) 20 MG tablet Take 1 tablet (20 mg total) by mouth 3  (three) times daily. 08/21/23     famotidine  (PEPCID ) 20 MG tablet Take 1 tablet (20 mg total) by mouth 2 (two) times daily. 09/25/20 09/25/21  Magnant, Charles L, PA-C  lidocaine  (XYLOCAINE ) 2 % solution Use as directed 15 mLs in the mouth or throat as needed for mouth pain. 10/04/22   Ngetich, Dinah C, NP  metoCLOPramide  (REGLAN ) 5 MG tablet Take 1 tablet (5 mg total) by mouth 3 (three) times daily. 07/24/23     nystatin  (MYCOSTATIN ) 100000 UNIT/ML suspension SWISH AND SPIT 5 MLS 3 (THREE) TIMES DAILY. 10/03/22   Ngetich, Dinah C, NP  pantoprazole  (PROTONIX ) 40 MG tablet TAKE 1 TABLET BY MOUTH ONCE DAILY 30 MINUTES BEFORE BREAKFAST 01/09/23   Ngetich, Dinah C, NP  pantoprazole  (PROTONIX ) 40 MG tablet Take 1 tablet (40 mg total) by mouth 2 (two) times daily. 08/21/23     potassium chloride  SA (KLOR-CON  M) 20 MEQ tablet TAKE 1 TABLET (20 MEQ TOTAL) BY MOUTH DAILY. NEEDS APPOINTMENT BEFORE ANYMORE FUTURE REFILLS. 06/12/23   Ngetich, Roxan BROCKS, NP  Probiotic Product (RESTORA) CAPS Take 1 capsule by mouth daily.    [provider]  Past Surgical History Past Surgical History:  Procedure Laterality Date   ABDOMINAL HYSTERECTOMY     EYE SURGERY     fibroid tumor removal     FOOT SURGERY     LAPAROSCOPIC GASTROTOMY W/ REPAIR OF ULCER     PARTIAL HYSTERECTOMY     TONSILLECTOMY     AS CHILD   TOTAL SHOULDER ARTHROPLASTY Right 06/16/2020   Procedure: RIGHT TOTAL SHOULDER ARTHROPLASTY;  Surgeon: Addie Cordella Hamilton, MD;  Location: [facility] OR;  Service: Orthopedics;  Laterality: Right;   TUBAL LIGATION     Family History Family History  Problem Relation Age of Onset   COPD Mother    Cancer Mother    Other Mother        blood cloth   Diabetes Other     Social History Social History   Tobacco Use   Smoking status: Former    Current packs/day: 0.00    Types: Cigarettes     Quit date: 01/04/1992    Years since quitting: 31.8   Smokeless tobacco: Never  Vaping Use   Vaping status: Never Used  Substance Use Topics   Alcohol use: No   Drug use: No   Allergies Ampicillin and Penicillins  Review of Systems Review of Systems  All other systems reviewed and are negative.   Physical Exam Vital Signs  I have reviewed the triage vital signs BP 123/84   Pulse 66   Temp 98.7 F (37.1 C) (Oral)   Resp 18   SpO2 99%  Physical Exam Vitals and nursing note reviewed.  Constitutional:      General: She is not in acute distress.    Appearance: She is well-developed.  HENT:     Head: Normocephalic and atraumatic.     Mouth/Throat:     Mouth: Mucous membranes are moist.  Eyes:     Pupils: Pupils are equal, round, and reactive to light.  Cardiovascular:     Rate and Rhythm: Normal rate and regular rhythm.     Heart sounds: No murmur heard. Pulmonary:     Effort: Pulmonary effort is normal. No respiratory distress.     Breath sounds: Normal breath sounds.  Abdominal:     General: Abdomen is flat.     Palpations: Abdomen is soft.     Tenderness: There is generalized abdominal tenderness.  Musculoskeletal:        General: No tenderness.     Right lower leg: No edema.     Left lower leg: No edema.  Skin:    General: Skin is warm and dry.  Neurological:     General: No focal deficit present.     Mental Status: She is alert. Mental status is at baseline.  Psychiatric:        Mood and Affect: Mood normal.        Behavior: Behavior normal.     ED Results and Treatments Labs (all labs ordered are listed, but only abnormal results are displayed) Labs Reviewed  LIPASE, BLOOD - Abnormal; Notable for the following components:      Result Value   Lipase 71 (*)    All other components within normal limits  COMPREHENSIVE METABOLIC PANEL WITH GFR - Abnormal; Notable for the following components:   Sodium 133 (*)    CO2 19 (*)    Creatinine, Ser 1.01 (*)     All other components within normal limits  CBC  Radiology CT ABDOMEN PELVIS W CONTRAST Result Date: 11/06/2023 EXAM: CT ABDOMEN AND PELVIS WITH CONTRAST 11/06/2023 11:19:58 PM TECHNIQUE: CT of the abdomen and pelvis was performed with the administration of 100 mL of iohexol (OMNIPAQUE) 300 MG/ML solution. Multiplanar reformatted images are provided for review. Automated exposure control, iterative reconstruction, and/or weight-based adjustment of the mA/kV was utilized to reduce the radiation dose to as low as reasonably achievable. COMPARISON: 11/29/2021 CLINICAL HISTORY: Abdominal pain, acute, nonlocalized. FINDINGS: LOWER CHEST: No acute abnormality. LIVER: The liver is unremarkable. GALLBLADDER AND BILE DUCTS: Gallbladder is unremarkable. No biliary ductal dilatation. SPLEEN: No acute abnormality. PANCREAS: No acute abnormality. ADRENAL GLANDS: No acute abnormality. KIDNEYS, URETERS AND BLADDER: No stones in the kidneys or ureters. No hydronephrosis. No perinephric or periureteral stranding. Urinary bladder is unremarkable. GI AND BOWEL: Stomach demonstrates no acute abnormality. There is no bowel obstruction. Normal appendix. PERITONEUM AND RETROPERITONEUM: No ascites. No free air. VASCULATURE: Aorta is normal in caliber. Aortic atherosclerosis. LYMPH NODES: No lymphadenopathy. REPRODUCTIVE ORGANS: No acute abnormality. BONES AND SOFT TISSUES: No acute osseous abnormality. No focal soft tissue abnormality. IMPRESSION: 1. No acute findings in the abdomen or pelvis. 2. Aortic atherosclerosis. Electronically signed by: Franky Crease MD 11/06/2023 11:45 PM EST RP Workstation: [id number]    Pertinent labs & imaging results that were available during my care of the patient were reviewed by me and considered in my medical decision making (see MDM for details).  Medications Ordered in  ED Medications  ondansetron  (ZOFRAN ) injection 4 mg (4 mg Intravenous Given 11/06/23 2131)  HYDROmorphone  (DILAUDID ) injection 0.5 mg (0.5 mg Intravenous Given 11/06/23 2135)  iohexol (OMNIPAQUE) 300 MG/ML solution 100 mL (100 mLs Intravenous Contrast Given 11/06/23 2301)                                                                                                                                     Procedures Procedures  (including critical care time)  Medical Decision Making / ED Course   MDM:  68 year old presenting with abdominal pain.  Differential includes chronic gastroparesis pain, pancreatitis, GERD, volvulus, obstruction, perforation, cholecystitis, kidney stone.  Will obtain further testing clued a CT scan.  Labs with borderline elevation in lipase but not significant enough to be diagnostic of pancreatitis.  Will give additional pain medication and reassess.  Clinical Course as of 11/06/23 2358  Mon Nov 06, 2023  2356 CT scan is negative for any acute process. Will discharge patient to home. All questions answered. Patient comfortable with plan of discharge. Return precautions discussed with patient and specified on the after visit summary.  [WS]    Clinical Course User Index [WS] Francesca Elsie CROME, MD     Additional history obtained:  -External records from outside source obtained and reviewed including: Chart review including previous notes, labs, imaging, consultation notes including prior notes    Lab Tests: -I ordered, reviewed, and interpreted labs.   The  pertinent results include:   Labs Reviewed  LIPASE, BLOOD - Abnormal; Notable for the following components:      Result Value   Lipase 71 (*)    All other components within normal limits  COMPREHENSIVE METABOLIC PANEL WITH GFR - Abnormal; Notable for the following components:   Sodium 133 (*)    CO2 19 (*)    Creatinine, Ser 1.01 (*)    All other components within normal limits  CBC    Notable  for nonspecific minimal lipase elevation   Imaging Studies ordered: I ordered imaging studies including CT abdomen pelvis  On my interpretation imaging demonstrates no acute process I independently visualized and interpreted imaging. I agree with the radiologist interpretation   Medicines ordered and prescription drug management: Meds ordered this encounter  Medications   ondansetron  (ZOFRAN ) injection 4 mg   HYDROmorphone  (DILAUDID ) injection 0.5 mg   iohexol (OMNIPAQUE) 300 MG/ML solution 100 mL    -I have reviewed the patients home medicines and have made adjustments as needed   Reevaluation: After the interventions noted above, I reevaluated the patient and found that their symptoms have improved  Co morbidities that complicate the patient evaluation  Past Medical History:  Diagnosis Date   Allergy    Anemia    Arthritis    Diverticulitis    Gastric ulcer    GERD (gastroesophageal reflux disease)    Hepatitis    Hypertension       Dispostion: Disposition decision including need for hospitalization was considered, and patient discharged from emergency department.    Final Clinical Impression(s) / ED Diagnoses Final diagnoses:  Generalized abdominal pain     This chart was dictated using voice recognition software.  Despite best efforts to proofread,  errors can occur which can change the documentation meaning.    Francesca Elsie CROME, MD 11/06/23 2358

## 2023-11-06 NOTE — Discharge Instructions (Addendum)
 We evaluated you for your abdominal pain. Please follow up with your primary doctor. Your testing in the emergency department was reassuring. Please return if your symptoms worsen.

## 2023-11-07 ENCOUNTER — Other Ambulatory Visit (HOSPITAL_COMMUNITY): Payer: Self-pay

## 2023-11-07 MED ORDER — ONDANSETRON 4 MG PO TBDP
4.0000 mg | ORAL_TABLET | Freq: Three times a day (TID) | ORAL | 0 refills | Status: AC | PRN
  Filled 2023-11-07: qty 20, 7d supply, fill #0

## 2023-12-21 ENCOUNTER — Ambulatory Visit: Admitting: Orthopedic Surgery

## 2023-12-21 DIAGNOSIS — G8929 Other chronic pain: Secondary | ICD-10-CM | POA: Diagnosis not present

## 2023-12-21 DIAGNOSIS — M25561 Pain in right knee: Secondary | ICD-10-CM

## 2023-12-21 DIAGNOSIS — M170 Bilateral primary osteoarthritis of knee: Secondary | ICD-10-CM | POA: Diagnosis not present

## 2023-12-21 DIAGNOSIS — M25562 Pain in left knee: Secondary | ICD-10-CM

## 2023-12-22 ENCOUNTER — Encounter: Payer: Self-pay | Admitting: Orthopedic Surgery

## 2023-12-22 MED ORDER — TRIAMCINOLONE ACETONIDE 40 MG/ML IJ SUSP
40.0000 mg | INTRAMUSCULAR | Status: AC | PRN
  Administered 2023-12-21: 40 mg via INTRA_ARTICULAR

## 2023-12-22 MED ORDER — LIDOCAINE HCL 1 % IJ SOLN
5.0000 mL | INTRAMUSCULAR | Status: AC | PRN
  Administered 2023-12-21: 5 mL

## 2023-12-22 MED ORDER — BUPIVACAINE HCL 0.25 % IJ SOLN
4.0000 mL | INTRAMUSCULAR | Status: AC | PRN
  Administered 2023-12-21: 4 mL via INTRA_ARTICULAR

## 2023-12-22 NOTE — Progress Notes (Signed)
 "  Office Visit Note   Patient: Angela Villanueva           Date of Birth: February 27, 1955           MRN: [id number] Visit Date: 12/21/2023 Requested by: No referring provider defined for this encounter. PCP: Arloa Jarvis, NP  Subjective: Chief Complaint  Patient presents with   Right Knee - Follow-up, Pain   Left Knee - Pain    HPI: Angela Villanueva is a 68 y.o. female who presents to the office reporting continued bilateral knee pain.  In the past the right knee has been more symptomatic but the left one is symptomatic now as well.  Not really taking too much in terms of pain medication.  Does report pain with prolonged ambulation but no mechanical symptoms..                ROS: All systems reviewed are negative as they relate to the chief complaint within the history of present illness.  Patient denies fevers or chills.  Assessment & Plan: Visit Diagnoses:  1. Chronic pain of both knees     Plan: Impression is bilateral knee arthritis.  Bilateral cortisone injections performed today.  I think we should get her preapproved for gel injection in both knees to be done in March as a scheduled injection.  She will follow-up at that time.  Wants to delay knee replacement as long as possible.  No effusion in either knee today.  Follow-Up Instructions: No follow-ups on file.   Orders:  Orders Placed This Encounter  Procedures   Ambulatory request for injection medication   No orders of the defined types were placed in this encounter.     Procedures: Large Joint Inj: bilateral knee on 12/21/2023 7:43 AM Indications: diagnostic evaluation, joint swelling and pain Details: 18 G 1.5 in needle, superolateral approach  Arthrogram: No  Medications (Right): 5 mL lidocaine  1 %; 4 mL bupivacaine  0.25 %; 40 mg triamcinolone  acetonide 40 MG/ML Medications (Left): 5 mL lidocaine  1 %; 4 mL bupivacaine  0.25 %; 40 mg triamcinolone  acetonide 40 MG/ML Outcome: tolerated well, no immediate  complications Procedure, treatment alternatives, risks and benefits explained, specific risks discussed. Consent was given by the patient. Immediately prior to procedure a time out was called to verify the correct patient, procedure, equipment, support staff and site/side marked as required. Patient was prepped and draped in the usual sterile fashion.       Clinical Data: No additional findings.  Objective: Vital Signs: There were no vitals taken for this visit.  Physical Exam:  Constitutional: Patient appears well-developed HEENT:  Head: Normocephalic Eyes:EOM are normal Neck: Normal range of motion Cardiovascular: Normal rate Pulmonary/chest: Effort normal Neurologic: Patient is alert Skin: Skin is warm Psychiatric: Patient has normal mood and affect  Ortho Exam: Ortho exam demonstrates range of motion of both knees to be 0-1 20 of flexion.  No effusion in either knee.  Mild patellofemoral crepitus is present.  Collateral crucial ligaments are stable.  No focal joint line tenderness noted.  Specialty Comments:  No specialty comments available.  Imaging: No results found.   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   Gastroparesis 09/01/2015   Abdominal pain, epigastric 01/22/2014   Esophageal reflux 05/02/2012   Dysphagia 05/02/2012   DUODENITIS, WITHOUT HEMORRHAGE 04/09/2007   Constipation 04/09/2007   History of peptic ulcer disease 04/09/2007  Past Medical History:  Diagnosis Date   Allergy    Anemia    Arthritis    Diverticulitis    Gastric ulcer    GERD (gastroesophageal reflux disease)    Hepatitis    Hypertension     Family History  Problem Relation Age of Onset   COPD Mother    Cancer Mother    Other Mother        blood cloth   Diabetes Other     Past Surgical History:  Procedure Laterality Date   ABDOMINAL HYSTERECTOMY     EYE SURGERY     fibroid  tumor removal     FOOT SURGERY     LAPAROSCOPIC GASTROTOMY W/ REPAIR OF ULCER     PARTIAL HYSTERECTOMY     TONSILLECTOMY     AS CHILD   TOTAL SHOULDER ARTHROPLASTY Right 06/16/2020   Procedure: RIGHT TOTAL SHOULDER ARTHROPLASTY;  Surgeon: Addie Cordella Hamilton, MD;  Location: [facility] OR;  Service: Orthopedics;  Laterality: Right;   TUBAL LIGATION     Social History   Occupational History   Occupation: Custodian    Employer: [employer]  Tobacco Use   Smoking status: Former    Current packs/day: 0.00    Types: Cigarettes    Quit date: 01/04/1992    Years since quitting: 31.9   Smokeless tobacco: Never  Vaping Use   Vaping status: Never Used  Substance and Sexual Activity   Alcohol use: No   Drug use: No   Sexual activity: Not Currently        "

## 2024-03-20 ENCOUNTER — Ambulatory Visit: Admitting: Orthopedic Surgery
